# Patient Record
Sex: Female | Born: 1995 | Race: White | Hispanic: No | Marital: Single | Smoking: Former smoker
Health system: Southern US, Community
[De-identification: ages and names within clinical notes are randomized; demographics above are authoritative.]

## PROBLEM LIST (undated history)

## (undated) ENCOUNTER — Inpatient Hospital Stay (HOSPITAL_COMMUNITY): Payer: Self-pay

## (undated) DIAGNOSIS — B379 Candidiasis, unspecified: Secondary | ICD-10-CM

## (undated) DIAGNOSIS — Z803 Family history of malignant neoplasm of breast: Secondary | ICD-10-CM

## (undated) DIAGNOSIS — B9689 Other specified bacterial agents as the cause of diseases classified elsewhere: Secondary | ICD-10-CM

## (undated) DIAGNOSIS — F909 Attention-deficit hyperactivity disorder, unspecified type: Secondary | ICD-10-CM

## (undated) DIAGNOSIS — N760 Acute vaginitis: Secondary | ICD-10-CM

## (undated) HISTORY — PX: NO PAST SURGERIES: SHX2092

## (undated) HISTORY — PX: CHOLECYSTECTOMY: SHX55

## (undated) HISTORY — DX: Acute vaginitis: N76.0

## (undated) HISTORY — DX: Family history of malignant neoplasm of breast: Z80.3

## (undated) HISTORY — DX: Acute vaginitis: B96.89

## (undated) HISTORY — DX: Candidiasis, unspecified: B37.9

## (undated) HISTORY — DX: Attention-deficit hyperactivity disorder, unspecified type: F90.9

---
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
## 2016-07-18 ENCOUNTER — Encounter: Payer: Self-pay | Admitting: Pediatrics

## 2016-07-19 ENCOUNTER — Encounter: Payer: Self-pay | Admitting: Pediatrics
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
## 2017-10-07 ENCOUNTER — Encounter: Payer: Self-pay | Admitting: Emergency Medicine

## 2017-10-07 ENCOUNTER — Emergency Department
Admission: EM | Admit: 2017-10-07 | Discharge: 2017-10-07 | Disposition: A | Discharge status: Home / Self Care | Payer: Self-pay | Attending: Emergency Medicine | Admitting: Emergency Medicine

## 2017-10-07 DIAGNOSIS — B9789 Other viral agents as the cause of diseases classified elsewhere: Secondary | ICD-10-CM | POA: Insufficient documentation

## 2017-10-07 DIAGNOSIS — Z79899 Other long term (current) drug therapy: Secondary | ICD-10-CM | POA: Insufficient documentation

## 2017-10-07 DIAGNOSIS — J069 Acute upper respiratory infection, unspecified: Secondary | ICD-10-CM | POA: Insufficient documentation

## 2017-10-07 MED ORDER — PSEUDOEPH-BROMPHEN-DM 30-2-10 MG/5ML PO SYRP: 5.0000 mL | ORAL_SOLUTION | Freq: Four times a day (QID) | ORAL | 0 refills | Status: DC | PRN

## 2017-10-07 MED ORDER — METHYLPREDNISOLONE 4 MG PO TBPK: ORAL_TABLET | ORAL | 0 refills | Status: DC

## 2017-10-07 NOTE — ED Notes (Signed)
AAOx3.  Skin warm and dry.  NAD 

## 2017-10-07 NOTE — ED Triage Notes (Signed)
Productive cough and sinus congestion x 2 days.  Also c/o pain with cough and general body aches.

## 2017-10-07 NOTE — ED Provider Notes (Signed)
[facility] Emergency Department Provider Note   ____________________________________________   First MD Initiated Contact with Patient 10/07/17 0848     (approximate)  I have reviewed the triage vital signs and the nursing notes.   HISTORY  Chief Complaint Cough and URI    HPI Michele Fields is a 21 y.o. female patient complaining of productive cough and sinus congestion for 2 days. Patient stated bite secondary to continue coughing. Patient denies fever associated this complaint. Patient denies nausea, vomiting, diarrhea. No palliative measures for his complaint. Patient rates the pain as a 5/10. Patient described a pain as "achy".   Past Medical History:  Diagnosis Date  . ADHD (attention deficit hyperactivity disorder)     Patient Active Problem List   Diagnosis Date Noted  . Thyroid nodule 10/19/2014  . BMI (body mass index), pediatric, > 99% for age 19/23/2014  . ADD (attention deficit disorder) 07/15/2013  . Presence of subdermal contraceptive device 07/15/2013  . Headache(784.0) 07/15/2013  . Allergic rhinitis 07/15/2013  . Menorrhagia 06/19/2013  . Screening examination for venereal disease 06/19/2013  . Abdominal pain, other specified site 06/19/2013    History reviewed. No pertinent surgical history.  Prior to Admission medications   Medication Sig Start Date End Date Taking? Authorizing Provider  brompheniramine-pseudoephedrine-DM 30-2-10 MG/5ML syrup Take 5 mLs by mouth 4 (four) times daily as needed. 10/07/17   Joni Reining, PA-C  etonogestrel (NEXPLANON) 68 MG IMPL implant Inject 1 each (68 mg total) into the skin once. 07/15/13   Owens Shark, MD  ibuprofen (ADVIL,MOTRIN) 800 MG tablet Take 800 mg by mouth every 8 (eight) hours as needed for headache or moderate pain.    [provider]  methylPREDNISolone (MEDROL DOSEPAK) 4 MG TBPK tablet Take Tapered dose as directed 10/07/17   Joni Reining, PA-C  ondansetron  (ZOFRAN) 8 MG tablet Take 8 mg by mouth daily.    [provider]  ranitidine (ZANTAC) 150 MG tablet Take 150 mg by mouth 2 (two) times daily.    [provider]    Allergies Other  Family History  Problem Relation Age of Onset  . Thyroid disease Paternal Grandmother     Social History Social History  Substance Use Topics  . Smoking status: Never Smoker  . Smokeless tobacco: Never Used  . Alcohol use No    Review of Systems  Constitutional: No fever/chills Eyes: No visual changes. ENT: No sore throat. Cardiovascular: Denies chest pain. Respiratory: Denies shortness of breath. Gastrointestinal: No abdominal pain.  No nausea, no vomiting.  No diarrhea.  No constipation. Genitourinary: Negative for dysuria. Musculoskeletal: Negative for back pain. Skin: Negative for rash. Neurological: Negative for headaches, focal weakness or numbness. Psychiatric:ADHD  ____________________________________________   PHYSICAL EXAM:  VITAL SIGNS: ED Triage Vitals [10/07/17 0841]  Enc Vitals Group     BP 123/83     Pulse Rate 93     Resp 16     Temp 97.9 F (36.6 C)     Temp Source Oral     SpO2 100 %     Weight 200 lb (90.7 kg)     Height  (1.6 m)     Head Circumference      Peak Flow      Pain Score 5     Pain Loc      Pain Edu?      Excl. in GC?    Constitutional: Alert and oriented. Well appearing and in  no acute distress. Eyes: Conjunctivae are normal. PERRL. EOMI. Head: Atraumatic. Nose: bilateral maxillary guarding. Edematous nasal turbinates clear rhinorrhea. Mouth/Throat: Mucous membranes are moist.  Oropharynx non-erythematous.  Postnasal drainage Neck: No stridor.  Hematological/Lymphatic/Immunilogical: No cervical lymphadenopathy. Cardiovascular: Normal rate, regular rhythm. Grossly normal heart sounds.  Good peripheral circulation. Respiratory: Normal respiratory effort.  No retractions. Mild Rales. Coughing with deep  inspiration Neurologic:  Normal speech and language. No gross focal neurologic deficits are appreciated. No gait instability. Skin:  Skin is warm, dry and intact. No rash noted. Psychiatric: Mood and affect are normal. Speech and behavior are normal.  ____________________________________________   LABS (all labs ordered are listed, but only abnormal results are displayed)  Labs Reviewed - No data to display ____________________________________________  EKG   ____________________________________________  RADIOLOGY  No results found.  ____________________________________________   PROCEDURES  Procedure(s) performed: None  Procedures  Critical Care performed: No  ____________________________________________   INITIAL IMPRESSION / ASSESSMENT AND PLAN / ED COURSE  As part of my medical decision making, I reviewed the following data within the electronic MEDICAL RECORD NUMBER    Cough and nasal congestion secondary to viral respiratory infection. Patient given discharge care instruction. Patient given a work note and advised take medication as directed. Patient advised to follow-up with PCP.      ____________________________________________   FINAL CLINICAL IMPRESSION(S) / ED DIAGNOSES  Final diagnoses:  Viral URI with cough      NEW MEDICATIONS STARTED DURING THIS VISIT:  New Prescriptions   BROMPHENIRAMINE-PSEUDOEPHEDRINE-DM 30-2-10 MG/5ML SYRUP    Take 5 mLs by mouth 4 (four) times daily as needed.   METHYLPREDNISOLONE (MEDROL DOSEPAK) 4 MG TBPK TABLET    Take Tapered dose as directed     Note:  This document was prepared using Dragon voice recognition software and may include unintentional dictation errors.    Joni Reining, PA-C 10/07/17 2841    Jene Every, MD 10/07/17 [id number]

## 2017-11-09 ENCOUNTER — Emergency Department
Admission: EM | Admit: 2017-11-09 | Discharge: 2017-11-10 | Disposition: A | Discharge status: Home / Self Care | Payer: Medicaid Other | Attending: Emergency Medicine | Admitting: Emergency Medicine

## 2017-11-09 ENCOUNTER — Emergency Department: Payer: Medicaid Other

## 2017-11-09 ENCOUNTER — Encounter: Payer: Self-pay | Admitting: Emergency Medicine

## 2017-11-09 DIAGNOSIS — B9689 Other specified bacterial agents as the cause of diseases classified elsewhere: Secondary | ICD-10-CM

## 2017-11-09 DIAGNOSIS — B3731 Acute candidiasis of vulva and vagina: Secondary | ICD-10-CM

## 2017-11-09 DIAGNOSIS — N760 Acute vaginitis: Secondary | ICD-10-CM

## 2017-11-09 DIAGNOSIS — N898 Other specified noninflammatory disorders of vagina: Secondary | ICD-10-CM | POA: Insufficient documentation

## 2017-11-09 DIAGNOSIS — R102 Pelvic and perineal pain: Secondary | ICD-10-CM | POA: Insufficient documentation

## 2017-11-09 DIAGNOSIS — Z79899 Other long term (current) drug therapy: Secondary | ICD-10-CM | POA: Insufficient documentation

## 2017-11-09 DIAGNOSIS — B373 Candidiasis of vulva and vagina: Secondary | ICD-10-CM

## 2017-11-09 LAB — URINALYSIS, COMPLETE (UACMP) WITH MICROSCOPIC
BILIRUBIN URINE: NEGATIVE
Glucose, UA: NEGATIVE mg/dL
Hgb urine dipstick: NEGATIVE
Ketones, ur: 5 mg/dL — AB
Nitrite: NEGATIVE
Protein, ur: 30 mg/dL — AB
Specific Gravity, Urine: 1.031 — ABNORMAL HIGH (ref 1.005–1.030)
pH: 5 (ref 5.0–8.0)

## 2017-11-09 LAB — COMPREHENSIVE METABOLIC PANEL
ALBUMIN: 4.8 g/dL (ref 3.5–5.0)
ALT: 23 U/L (ref 14–54)
AST: 11 U/L — ABNORMAL LOW (ref 15–41)
Alkaline Phosphatase: 69 U/L (ref 38–126)
Anion gap: 11 (ref 5–15)
BUN: 10 mg/dL (ref 6–20)
CO2: 24 mmol/L (ref 22–32)
Calcium: 9.7 mg/dL (ref 8.9–10.3)
Chloride: 102 mmol/L (ref 101–111)
Creatinine, Ser: 0.84 mg/dL (ref 0.44–1.00)
GFR calc Af Amer: >60 mL/min (ref >60)
Glucose, Bld: 95 mg/dL (ref 65–99)
POTASSIUM: 4.2 mmol/L (ref 3.5–5.1)
Sodium: 137 mmol/L (ref 135–145)
Total Bilirubin: 1.1 mg/dL (ref 0.3–1.2)
Total Protein: 8.1 g/dL (ref 6.5–8.1)

## 2017-11-09 LAB — CBC
HCT: 39.6 % (ref 35.0–47.0)
Hemoglobin: 13.4 g/dL (ref 12.0–16.0)
MCH: 29.8 pg (ref 26.0–34.0)
MCHC: 33.9 g/dL (ref 32.0–36.0)
MCV: 87.8 fL (ref 80.0–100.0)
Platelets: 241 10*3/uL (ref 150–440)
RBC: 4.51 MIL/uL (ref 3.80–5.20)
RDW: 13.9 % (ref 11.5–14.5)
WBC: 14.3 10*3/uL — AB (ref 3.6–11.0)

## 2017-11-09 LAB — WET PREP, GENITAL
Sperm: NONE SEEN
TRICH WET PREP: NONE SEEN

## 2017-11-09 LAB — POCT PREGNANCY, URINE: Preg Test, Ur: NEGATIVE

## 2017-11-09 LAB — CHLAMYDIA/NGC RT PCR (ARMC ONLY)
Chlamydia Tr: NOT DETECTED
N GONORRHOEAE: NOT DETECTED

## 2017-11-09 MED ORDER — CEFTRIAXONE SODIUM 250 MG IJ SOLR
250.0000 mg | Freq: Once | INTRAMUSCULAR | Status: AC
  Administered 2017-11-09: 250 mg via INTRAMUSCULAR
  Filled 2017-11-09: qty 250

## 2017-11-09 MED ORDER — IBUPROFEN 800 MG PO TABS
800.0000 mg | ORAL_TABLET | Freq: Once | ORAL | Status: AC
  Administered 2017-11-09: 800 mg via ORAL
  Filled 2017-11-09: qty 1

## 2017-11-09 MED ORDER — LIDOCAINE HCL (PF) 1 % IJ SOLN
INTRAMUSCULAR | Status: AC
  Administered 2017-11-09: 0.9 mL
  Filled 2017-11-09: qty 5

## 2017-11-09 MED ORDER — LIDOCAINE HCL (PF) 1 % IJ SOLN
5.0000 mL | Freq: Once | INTRAMUSCULAR | Status: AC
  Administered 2017-11-09: 0.9 mL

## 2017-11-09 MED ORDER — METRONIDAZOLE 500 MG PO TABS
500.0000 mg | ORAL_TABLET | Freq: Once | ORAL | Status: AC
Start: 2017-11-10 — End: 2017-11-09
  Administered 2017-11-09: 500 mg via ORAL
  Filled 2017-11-09: qty 1

## 2017-11-09 MED ORDER — FLUCONAZOLE 50 MG PO TABS
150.0000 mg | ORAL_TABLET | Freq: Once | ORAL | Status: AC
  Administered 2017-11-09: 150 mg via ORAL
  Filled 2017-11-09: qty 1

## 2017-11-09 MED ORDER — DOXYCYCLINE HYCLATE 100 MG PO TABS: 100.0000 mg | ORAL_TABLET | Freq: Two times a day (BID) | ORAL | 0 refills | Status: DC

## 2017-11-09 NOTE — ED Provider Notes (Signed)
[facility] Emergency Department Provider Note   ____________________________________________    I have reviewed the triage vital signs and the nursing notes.   HISTORY  Chief Complaint Pelvic Pain     HPI Michele Fields is a 21 y.o. female who presents with pelvic pain and vaginal discharge.  Patient reports symptoms of been occurring over the last several days with worsening discharge and worsening cramping over the last 2 days.  She denies fevers or chills.  No nausea or vomiting.  She is concerned about STD.  She does report some discomfort with urination but she thinks it is related to pain in her vagina.  Is not taken anything for this   Past Medical History:  Diagnosis Date  . ADHD (attention deficit hyperactivity disorder)     Patient Active Problem List   Diagnosis Date Noted  . Thyroid nodule 10/19/2014  . BMI (body mass index), pediatric, > 99% for age 33/23/2014  . ADD (attention deficit disorder) 07/15/2013  . Presence of subdermal contraceptive device 07/15/2013  . Headache(784.0) 07/15/2013  . Allergic rhinitis 07/15/2013  . Menorrhagia 06/19/2013  . Screening examination for venereal disease 06/19/2013  . Abdominal pain, other specified site 06/19/2013    History reviewed. No pertinent surgical history.  Prior to Admission medications   Medication Sig Start Date End Date Taking? Authorizing Provider  brompheniramine-pseudoephedrine-DM 30-2-10 MG/5ML syrup Take 5 mLs by mouth 4 (four) times daily as needed. 10/07/17   Joni ReiningSmith, Ronald K, PA-C  etonogestrel (NEXPLANON) 68 MG IMPL implant Inject 1 each (68 mg total) into the skin once. 07/15/13   Owens SharkPerry, Martha F, MD  ibuprofen (ADVIL,MOTRIN) 800 MG tablet Take 800 mg by mouth every 8 (eight) hours as needed for headache or moderate pain.    [provider]  methylPREDNISolone (MEDROL DOSEPAK) 4 MG TBPK tablet Take Tapered dose as directed 10/07/17   Joni ReiningSmith, Ronald K, PA-C    ondansetron (ZOFRAN) 8 MG tablet Take 8 mg by mouth daily.    [provider]  ranitidine (ZANTAC) 150 MG tablet Take 150 mg by mouth 2 (two) times daily.    [provider]     Allergies Other  Family History  Problem Relation Age of Onset  . Thyroid disease Paternal Grandmother     Social History Social History   Tobacco Use  . Smoking status: Never Smoker  . Smokeless tobacco: Never Used  Substance Use Topics  . Alcohol use: No  . Drug use: No    Review of Systems  Constitutional: No fever/chills Eyes: No visual changes.  ENT: No sore throat. Cardiovascular: Denies chest pain. Respiratory: Denies shortness of breath. Gastrointestinal: No nausea, no vomiting.   Genitourinary: As above Musculoskeletal: Negative for back pain. Skin: Negative for rash. Neurological: Negative for headaches or weakness   ____________________________________________   PHYSICAL EXAM:  VITAL SIGNS: ED Triage Vitals [11/09/17 2010]  Enc Vitals Group     BP 131/81     Pulse Rate 79     Resp 16     Temp 99.3 F (37.4 C)     Temp Source Oral     SpO2 100 %     Weight 80.3 kg (177 lb)     Height 1.6 m (5\' 3" )     Head Circumference      Peak Flow      Pain Score 9     Pain Loc      Pain Edu?  Constitutional: Alert and oriented. No acute distress.  Eyes: Conjunctivae are normal.   Nose: No congestion/rhinnorhea. Mouth/Throat: Mucous membranes are moist.   Neck:  Painless ROM Cardiovascular: Normal rate, regular rhythm.   Good peripheral circulation. Respiratory: Normal respiratory effort.  No retractions.  Gastrointestinal: Mild lower abdomen tenderness to palpation .no distention.  No CVA tenderness. Genitourinary: Mild cervicitis, thick yellow discharge adherent to the cervix and in the vaginal vault Musculoskeletal: Warm and well perfused Neurologic:  Normal speech and language. No gross focal neurologic deficits are appreciated.  Skin:  Skin is  warm, dry and intact. No rash noted. Psychiatric: Mood and affect are normal. Speech and behavior are normal.  ____________________________________________   LABS (all labs ordered are listed, but only abnormal results are displayed)  Labs Reviewed  WET PREP, GENITAL - Abnormal; Notable for the following components:      Result Value   Yeast Wet Prep HPF POC PRESENT (*)    Clue Cells Wet Prep HPF POC PRESENT (*)    WBC, Wet Prep HPF POC MANY (*)    All other components within normal limits  COMPREHENSIVE METABOLIC PANEL - Abnormal; Notable for the following components:   AST 11 (*)    All other components within normal limits  CBC - Abnormal; Notable for the following components:   WBC 14.3 (*)    All other components within normal limits  URINALYSIS, COMPLETE (UACMP) WITH MICROSCOPIC - Abnormal; Notable for the following components:   Color, Urine YELLOW (*)    APPearance TURBID (*)    Specific Gravity, Urine 1.031 (*)    Ketones, ur 5 (*)    Protein, ur 30 (*)    Leukocytes, UA LARGE (*)    Bacteria, UA RARE (*)    Squamous Epithelial / LPF 6-30 (*)    All other components within normal limits  CHLAMYDIA/NGC RT PCR ([facility] ONLY)  POC URINE PREG, ED  POCT PREGNANCY, URINE   ____________________________________________  EKG  None ____________________________________________  RADIOLOGY  Ultrasound pelvis ____________________________________________   PROCEDURES  Procedure(s) performed: No  Procedures   Critical Care performed:No ____________________________________________   INITIAL IMPRESSION / ASSESSMENT AND PLAN / ED COURSE  Pertinent labs & imaging results that were available during my care of the patient were reviewed by me and considered in my medical decision making (see chart for details).  Patient presents with vaginal discharge and lower abdominal cramping.  Mild elevated white blood cell count.  Differential diagnosis includes  cervicitis, STD, PID, UTI  We will treat presumptively for STD/PID given exam.  Ultrasound pending    ____________________________________________   FINAL CLINICAL IMPRESSION(S) / ED DIAGNOSES  Final diagnoses:  Pelvic pain        Note:  This document was prepared using Dragon voice recognition software and may include unintentional dictation errors.    Jene EveryKinner, Shaena Parkerson, MD 11/09/17 [phone]

## 2017-11-09 NOTE — ED Triage Notes (Signed)
Pt states she has vaginal pain, white discharge and mid pelvic pain. Pt states she has had nausea. Pt is unsure of last period date. Pt appears in no acute distress in triage.

## 2017-11-10 MED ORDER — DOXYCYCLINE HYCLATE 100 MG PO TABS: 100.0000 mg | ORAL_TABLET | Freq: Two times a day (BID) | ORAL | 0 refills | Status: DC

## 2017-11-10 MED ORDER — IBUPROFEN 800 MG PO TABS: 800.0000 mg | ORAL_TABLET | Freq: Three times a day (TID) | ORAL | 0 refills | Status: DC | PRN

## 2017-11-10 MED ORDER — METRONIDAZOLE 500 MG PO TABS: 500.0000 mg | ORAL_TABLET | Freq: Two times a day (BID) | ORAL | 0 refills | Status: DC

## 2017-11-10 NOTE — ED Provider Notes (Signed)
-----------------------------------------   12:13 AM on 11/10/2017 -----------------------------------------  Updated patient of negative ultrasound.  Will discharge home with Flagyl for bacterial vaginitis.  Patient received IM Rocephin and Diflucan here.  Strict return precautions given.  Patient verbalizes understanding and agrees with plan of care.  Pelvis US interpreted per Dr. Phill MyronMcClintock: Normal pelvic ultrasound. No acute abnormality identified.   Irean HongSung, Jade J, MD 11/10/17 [phone]

## 2017-11-10 NOTE — Discharge Instructions (Signed)
1.  Continue antibiotics and anti-inflammatory as prescribed. 2.  Return to the ER for worsening symptoms, persistent vomiting, difficulty breathing or other concerns.

## 2017-11-18 ENCOUNTER — Ambulatory Visit (INDEPENDENT_AMBULATORY_CARE_PROVIDER_SITE_OTHER): Payer: Medicaid Other | Admitting: Obstetrics and Gynecology

## 2017-11-18 ENCOUNTER — Encounter: Payer: Self-pay | Admitting: Obstetrics and Gynecology

## 2017-11-18 ENCOUNTER — Telehealth: Payer: Self-pay | Admitting: Obstetrics and Gynecology

## 2017-11-18 VITALS — BP 120/70 | HR 110 | Ht 63.0 in | Wt 180.0 lb

## 2017-11-18 DIAGNOSIS — Z309 Encounter for contraceptive management, unspecified: Secondary | ICD-10-CM | POA: Diagnosis not present

## 2017-11-18 DIAGNOSIS — Z113 Encounter for screening for infections with a predominantly sexual mode of transmission: Secondary | ICD-10-CM | POA: Diagnosis not present

## 2017-11-18 DIAGNOSIS — Z3009 Encounter for other general counseling and advice on contraception: Secondary | ICD-10-CM

## 2017-11-18 DIAGNOSIS — N739 Female pelvic inflammatory disease, unspecified: Secondary | ICD-10-CM

## 2017-11-18 DIAGNOSIS — Z3202 Encounter for pregnancy test, result negative: Secondary | ICD-10-CM

## 2017-11-18 DIAGNOSIS — N912 Amenorrhea, unspecified: Secondary | ICD-10-CM

## 2017-11-18 DIAGNOSIS — Z0000 Encounter for general adult medical examination without abnormal findings: Secondary | ICD-10-CM

## 2017-11-18 LAB — POCT URINE PREGNANCY: Preg Test, Ur: NEGATIVE

## 2017-11-18 NOTE — Telephone Encounter (Addendum)
Patient [facility]

## 2017-11-18 NOTE — Progress Notes (Signed)
Gynecology Annual Exam  PCP: Patient, No Pcp Per  Chief Complaint:  Chief Complaint  Patient presents with  . Gynecologic Exam    lower abd and back pain, recent BV and yeast infection    History of Present Illness: Patient is a 21 y.o. G0P0000 presents for annual exam. The patient complains of irregular periods. She says that since the time of starting her periods at 16 or 17 that her cycles have been irregular. She was placed on birth control and then had a Nexplanon. For the three years that she had a Nexplanon, she did not have a period. The Nexplanon was removed in Krystian of 2018. She has not had a period since the removal of the nexplanon.  LMP: No LMP recorded (approximate). Menarche:16 Average Interval: irregular, not applicable days Duration of flow: unknown days Heavy Menses: yes Clots: no Intermenstrual Bleeding: yes Postcoital Bleeding: no Dysmenorrhea: yes  The patient is sexually active. She currently uses none for contraception. She denies dyspareunia.  The patient does not perform self breast exams.  There is notable family history of breast or ovarian cancer in her family. Her Aunt had Breast cancer in her 30s.    The patient wears seatbelts: yes.  The patient has regular exercise: not asked.    The patient denies current symptoms of depression.    Review of Systems: Review of Systems  Constitutional: Negative for chills, fever, malaise/fatigue and weight loss.  HENT: Negative for congestion, hearing loss and sinus pain.   Eyes: Negative for blurred vision and double vision.  Respiratory: Negative for cough, sputum production, shortness of breath and wheezing.   Cardiovascular: Negative for chest pain, palpitations, orthopnea and leg swelling.  Gastrointestinal: Negative for abdominal pain, constipation, diarrhea, nausea and vomiting.  Genitourinary: Negative for dysuria, flank pain, frequency, hematuria and urgency.  Musculoskeletal: Negative for back  pain, falls and joint pain.  Skin: Negative for itching and rash.  Neurological: Negative for dizziness and headaches.  Psychiatric/Behavioral: Negative for depression, substance abuse and suicidal ideas. The patient is not nervous/anxious.     Past Medical History:  Past Medical History:  Diagnosis Date  . ADHD (attention deficit hyperactivity disorder)   . Bacterial vaginosis   . Yeast infection     Past Surgical History:  Past Surgical History:  Procedure Laterality Date  . NO PAST SURGERIES      Gynecologic History:  No LMP recorded (approximate). Contraception: none Last Pap: Not recommended based on age.  Obstetric History: G0P0000  Family History:  Family History  Problem Relation Age of Onset  . Thyroid disease Paternal Grandmother     Social History:  Social History   Socioeconomic History  . Marital status: Single    Spouse name: Not on file  . Number of children: Not on file  . Years of education: Not on file  . Highest education level: Not on file  Social Needs  . Financial resource strain: Not on file  . Food insecurity - worry: Not on file  . Food insecurity - inability: Not on file  . Transportation needs - medical: Not on file  . Transportation needs - non-medical: Not on file  Occupational History  . Not on file  Tobacco Use  . Smoking status: Never Smoker  . Smokeless tobacco: Never Used  Substance and Sexual Activity  . Alcohol use: No  . Drug use: No  . Sexual activity: Yes    Birth control/protection: None  Other Topics Concern  .  Not on file  Social History Narrative  . Not on file    Allergies:  Allergies  Allergen Reactions  . Other Other (See Comments)    Seasonal Allergies    Medications: Prior to Admission medications   Medication Sig Start Date End Date Taking? Authorizing Provider  doxycycline (VIBRA-TABS) 100 MG tablet Take 1 tablet (100 mg total) 2 (two) times daily by mouth. 11/10/17  Yes Irean Hong, MD    ibuprofen (ADVIL,MOTRIN) 800 MG tablet Take 800 mg by mouth every 8 (eight) hours as needed. for pain 11/10/17  Yes [provider]  metroNIDAZOLE (FLAGYL) 500 MG tablet Take 1 tablet (500 mg total) 2 (two) times daily by mouth. 11/10/17  Yes Irean Hong, MD  metroNIDAZOLE (FLAGYL) 500 MG tablet Take 1 tablet (500 mg total) by mouth 2 (two) times daily for 7 days. 11/19/17 11/26/17  [name], MD    Physical Exam Vitals: Blood pressure 120/70, pulse (!) 110, height 5\' 3"  (1.6 m), weight 180 lb (81.6 kg).  General: NAD HEENT: normocephalic, anicteric Thyroid: no enlargement, no palpable nodules Pulmonary: No increased work of breathing, CTAB Cardiovascular: RRR, distal pulses 2+ Breast: Breast symmetrical, no tenderness, no palpable nodules or masses, no skin or nipple retraction present, no nipple discharge.  No axillary or supraclavicular lymphadenopathy. Abdomen: NABS, soft, non-tender, non-distended.  Umbilicus without lesions.  No hepatomegaly, splenomegaly or masses palpable. No evidence of hernia  Genitourinary:  External: Normal external female genitalia.  Normal urethral meatus, normal  Bartholin's and Skene's glands.    Vagina: Normal vaginal mucosa, no evidence of prolapse.    Cervix: Grossly normal in appearance, no bleeding  Uterus: Non-enlarged, mobile, normal contour. +CMT  Adnexa: ovaries non-enlarged, no adnexal masses, bilateral adnexal tenderness.  Rectal: deferred  Lymphatic: no evidence of inguinal lymphadenopathy Extremities: no edema, erythema, or tenderness Neurologic: Grossly intact Psychiatric: mood appropriate, affect full  Female chaperone present for pelvic and breast  portions of the physical exam    Assessment: 21 y.o. G0P0000 routine annual exam  Plan: Problem List Items Addressed This Visit    None    Visit Diagnoses    Amenorrhea    -  Primary   Relevant Orders   POCT urine pregnancy (Completed)   Screening examination  for STD (sexually transmitted disease)       Relevant Orders   GC/Chlamydia Probe Amp   Pelvic inflammatory disease       Relevant Medications   metroNIDAZOLE (FLAGYL) 500 MG tablet      1) Pelvic Inflammatory disease. Patient was just seen and treated for BV and PID at the hospital. She has been taking her antibiotics. She has completed one week of both doxycycline and flagyl. She has a second week of doxycycline, but not of flagyl. I send a script for one additional week of flagyl to the pharmacy so the patient could complete 2 weeks of treatment with both Doxycycline and flagyl in keeping with the CDC guidelines for outpatient treatment of PID. Will continue to monitor.  2) Contraception. Patient is having unprotected intercourse. She reports her last intercourse was on November 24th 2018. Her urine pregnancy test today in office was negative. She was happy with her previous form on contraception, the Nexplanon, but she would now like to switch to an IUD. Discussed risks and benefits of the device including pelvic pain, spotting, irregular bleeding, uterine perforation, and migration of the device. Delay insertion of IUD until symptoms of PID have improved.  3) STI screening was offered and aceepted. GC/CT swab sent. Patient was recently treated presumptively for these infections  4) ASCCP guidelines and rational of cervical cancer screening discussed.  Patient will start screening at 21 years old.  5) Secondary Amenorrhea- will need further evaluation. Follow up for additional work up.  6) Follow up in 1-2 weeks for IUD placement if PID symptoms have resolved. Discussed the importance of abstinence during this time period.

## 2017-11-19 MED ORDER — METRONIDAZOLE 500 MG PO TABS: 500.0000 mg | ORAL_TABLET | Freq: Two times a day (BID) | ORAL | 0 refills | Status: DC

## 2017-11-20 ENCOUNTER — Other Ambulatory Visit: Payer: Self-pay | Admitting: Obstetrics and Gynecology

## 2017-11-20 ENCOUNTER — Telehealth: Payer: Self-pay | Admitting: Obstetrics and Gynecology

## 2017-11-20 MED ORDER — ETONOGESTREL-ETHINYL ESTRADIOL 0.12-0.015 MG/24HR VA RING: VAGINAL_RING | VAGINAL | 12 refills | Status: DC

## 2017-11-20 NOTE — Telephone Encounter (Signed)
Pt aware Dr Jerene PitchSchuman out of the office today but sending to her for rx request.

## 2017-11-20 NOTE — Telephone Encounter (Signed)
Pt is calling to let Dr. Jerene PitchSchuman that she needs an medication refill and she is currently on her mentrual cycle. [facility] she waits the Nuva ring. Please advise pt.

## 2017-11-20 NOTE — Telephone Encounter (Signed)
Discussed with patient and sent rx for nuvaring to pharmacy.

## 2017-11-21 LAB — GC/CHLAMYDIA PROBE AMP
CHLAMYDIA, DNA PROBE: NEGATIVE
NEISSERIA GONORRHOEAE BY PCR: NEGATIVE

## 2017-11-25 ENCOUNTER — Ambulatory Visit (INDEPENDENT_AMBULATORY_CARE_PROVIDER_SITE_OTHER): Payer: Medicaid Other | Admitting: Obstetrics and Gynecology

## 2017-11-25 ENCOUNTER — Encounter: Payer: Self-pay | Admitting: Obstetrics and Gynecology

## 2017-11-25 VITALS — BP 110/60 | HR 72 | Ht 63.0 in | Wt 183.0 lb

## 2017-11-25 DIAGNOSIS — Z309 Encounter for contraceptive management, unspecified: Secondary | ICD-10-CM | POA: Diagnosis not present

## 2017-11-25 DIAGNOSIS — Z3009 Encounter for other general counseling and advice on contraception: Secondary | ICD-10-CM

## 2017-11-25 DIAGNOSIS — Z3202 Encounter for pregnancy test, result negative: Secondary | ICD-10-CM | POA: Diagnosis not present

## 2017-11-25 LAB — POCT URINE PREGNANCY: Preg Test, Ur: NEGATIVE

## 2017-11-25 NOTE — Progress Notes (Signed)
  HPI:      [name] is a 21 y.o. G0P0000 who LMP was Patient's last menstrual period was 11/19/2017., presents today for a problem visit.  She was originally scheduled to have a Kyleena inserted, but changed her mind and would prefer to use the [employer]. She has brought the Nuva ring into the office today for assistance in placement of the device. She also had questions regarding female orgasm.   PMHx: She  has a past medical history of ADHD (attention deficit hyperactivity disorder), Bacterial vaginosis, and Yeast infection. Also,  has a past surgical history that includes No past surgeries., family history includes Thyroid disease in her paternal grandmother.,  reports that  has never smoked. she has never used smokeless tobacco. She reports that she does not drink alcohol or use drugs.  She has a current medication list which includes the following prescription(s): doxycycline, etonogestrel-ethinyl estradiol, ibuprofen, and metronidazole. Also, is allergic to other.  Objective: BP 110/60   Pulse 72   Ht 5\' 3"  (1.6 m)   Wt 183 lb (83 kg)   LMP 11/19/2017   BMI 32.42 kg/m    No exam performed, but patient assisted with inserting the Nuvaring. Female chaperone was present in the room for the encounter.    ASSESSMENT/PLAN:    Problem List Items Addressed This Visit    None    Visit Diagnoses    Urine pregnancy test negative    -  Primary   Relevant Orders   POCT urine pregnancy (Completed)   Encounter for other general counseling and advice on contraception         More than 15 minutes was spent counseling patient on contraception, placement of contraceptive device and teaching.   Michele Fields Idlerhristanna Schuman MD 11/25/17 10:49 PM

## 2017-12-10 ENCOUNTER — Encounter: Payer: Self-pay | Admitting: Obstetrics and Gynecology

## 2017-12-31 ENCOUNTER — Encounter (HOSPITAL_COMMUNITY): Payer: Self-pay | Admitting: Emergency Medicine

## 2017-12-31 ENCOUNTER — Emergency Department (HOSPITAL_COMMUNITY)
Admission: EM | Admit: 2017-12-31 | Discharge: 2017-12-31 | Discharge status: Against Medical Advice | Payer: Medicaid Other | Attending: Emergency Medicine | Admitting: Emergency Medicine

## 2017-12-31 DIAGNOSIS — Z5321 Procedure and treatment not carried out due to patient leaving prior to being seen by health care provider: Secondary | ICD-10-CM | POA: Insufficient documentation

## 2017-12-31 LAB — URINALYSIS, ROUTINE W REFLEX MICROSCOPIC
BILIRUBIN URINE: NEGATIVE
Glucose, UA: NEGATIVE mg/dL
HGB URINE DIPSTICK: NEGATIVE
Ketones, ur: 20 mg/dL — AB
Leukocytes, UA: NEGATIVE
NITRITE: NEGATIVE
PROTEIN: NEGATIVE mg/dL
SPECIFIC GRAVITY, URINE: 1.018 (ref 1.005–1.030)
pH: 5 (ref 5.0–8.0)

## 2017-12-31 LAB — POC URINE PREG, ED: PREG TEST UR: NEGATIVE

## 2017-12-31 NOTE — ED Triage Notes (Signed)
Patient c/o bilat breast tenderness, cough with white-yellow phlegm for about week. Reports taht she took home pregnancy test and first one had 2 faint lines and second one didn't. Reports that she has white vaginal discharge but denies odors.

## 2017-12-31 NOTE — ED Notes (Signed)
Pt called from the lobby with no response 

## 2017-12-31 NOTE — ED Notes (Signed)
Patient called x2 no answer. 

## 2018-01-01 ENCOUNTER — Emergency Department
Admission: EM | Admit: 2018-01-01 | Discharge: 2018-01-01 | Disposition: A | Discharge status: Home / Self Care | Payer: Medicaid Other | Attending: Student in an Organized Health Care Education/Training Program | Admitting: Student in an Organized Health Care Education/Training Program

## 2018-01-01 ENCOUNTER — Encounter: Payer: Self-pay | Admitting: Emergency Medicine

## 2018-01-01 ENCOUNTER — Other Ambulatory Visit: Payer: Self-pay

## 2018-01-01 DIAGNOSIS — Z3202 Encounter for pregnancy test, result negative: Secondary | ICD-10-CM | POA: Insufficient documentation

## 2018-01-01 DIAGNOSIS — J069 Acute upper respiratory infection, unspecified: Secondary | ICD-10-CM | POA: Insufficient documentation

## 2018-01-01 LAB — INFLUENZA PANEL BY PCR (TYPE A & B)
Influenza A By PCR: NEGATIVE
Influenza B By PCR: NEGATIVE

## 2018-01-01 LAB — POCT PREGNANCY, URINE: Preg Test, Ur: NEGATIVE

## 2018-01-01 MED ORDER — ONDANSETRON 4 MG PO TBDP: 4.0000 mg | ORAL_TABLET | Freq: Three times a day (TID) | ORAL | 0 refills | Status: DC | PRN

## 2018-01-01 MED ORDER — BENZONATATE 200 MG PO CAPS: 200.0000 mg | ORAL_CAPSULE | Freq: Three times a day (TID) | ORAL | 0 refills | Status: DC | PRN

## 2018-01-01 MED ORDER — AZITHROMYCIN 250 MG PO TABS: ORAL_TABLET | ORAL | 0 refills | Status: DC

## 2018-01-01 MED ORDER — ONDANSETRON 4 MG PO TBDP
4.0000 mg | ORAL_TABLET | Freq: Once | ORAL | Status: AC
  Administered 2018-01-01: 4 mg via ORAL
  Filled 2018-01-01: qty 1

## 2018-01-01 NOTE — Discharge Instructions (Signed)
Follow-up with your regular doctor or the [facility] if you continue to have breast tenderness and nausea, take the medication as prescribed for the acute upper respiratory infection these are safe in pregnancy, return to emergency department if you are worsening

## 2018-01-01 NOTE — ED Triage Notes (Signed)
Pt with flu like sx for one week.

## 2018-01-01 NOTE — ED Provider Notes (Signed)
[facility] Emergency Department Provider Note  ____________________________________________   First MD Initiated Contact with Patient 01/01/18 [phone]     (approximate)  I have reviewed the triage vital signs and the nursing notes.   HISTORY  Chief Complaint Generalized Body Aches    HPI Michele Fields is a 22 y.o. female complains of generalized body aches, states she has had a cough and a fever for about a week, states fever got worse last night, she has had a headache and congestion, question if she is pregnant because she had a urine pregnancy test yesterday that had 1 faint line on, she denies chest pain or shortness of breath, abdominal pain or vaginal bleeding, she has had some nausea and vomiting, and states her breasts are sore   Past Medical History:  Diagnosis Date  . ADHD (attention deficit hyperactivity disorder)   . Bacterial vaginosis   . Family history of breast cancer   . Yeast infection     Patient Active Problem List   Diagnosis Date Noted  . Thyroid nodule 10/19/2014  . BMI (body mass index), pediatric, > 99% for age 26/23/2014  . ADD (attention deficit disorder) 07/15/2013  . Presence of subdermal contraceptive device 07/15/2013  . Headache(784.0) 07/15/2013  . Allergic rhinitis 07/15/2013  . Menorrhagia 06/19/2013  . Screening examination for venereal disease 06/19/2013  . Abdominal pain, other specified site 06/19/2013    Past Surgical History:  Procedure Laterality Date  . NO PAST SURGERIES      Prior to Admission medications   Medication Sig Start Date End Date Taking? Authorizing Provider  azithromycin (ZITHROMAX Z-PAK) 250 MG tablet 2 pills today then 1 pill a day for 4 days 01/01/18   Sherrie MustacheFisher, Roselyn BeringSusan W, PA-C  benzonatate (TESSALON) 200 MG capsule Take 1 capsule (200 mg total) by mouth 3 (three) times daily as needed for cough. 01/01/18   Faythe GheeFisher, Scottie Metayer W, PA-C  etonogestrel-ethinyl estradiol (NUVARING) 0.12-0.015 MG/24HR  vaginal ring Insert vaginally and leave in place for 3 consecutive weeks, then remove for 1 week. 11/20/17   Schuman, Christanna R, MD  ondansetron (ZOFRAN-ODT) 4 MG disintegrating tablet Take 1 tablet (4 mg total) by mouth every 8 (eight) hours as needed for nausea or vomiting. 01/01/18   Faythe GheeFisher, Kayah Hecker W, PA-C    Allergies Other  Family History  Problem Relation Age of Onset  . Thyroid disease Paternal Grandmother   . Breast cancer Maternal Aunt 32  . Breast cancer Other [id number]    Social History Social History   Tobacco Use  . Smoking status: Never Smoker  . Smokeless tobacco: Never Used  Substance Use Topics  . Alcohol use: No  . Drug use: No    Review of Systems  Constitutional: Positive fever/chills Eyes: No visual changes. ENT: No sore throat.  Positive runny nose and congestion Respiratory: Positive cough Genitourinary: Negative for dysuria. Musculoskeletal: Negative for back pain. Skin: Negative for rash.    ____________________________________________   PHYSICAL EXAM:  VITAL SIGNS: ED Triage Vitals  Enc Vitals Group     BP 01/01/18 0758 (!) 146/98     Pulse Rate 01/01/18 0758 94     Resp 01/01/18 0758 14     Temp 01/01/18 0758 98.9 F (37.2 C)     Temp Source 01/01/18 0758 Oral     SpO2 01/01/18 0758 100 %     Weight 01/01/18 0758 175 lb (79.4 kg)     Height 01/01/18 0758 5\' 3"  (1.6 m)  Head Circumference --      Peak Flow --      Pain Score 01/01/18 0802 9     Pain Loc --      Pain Edu? --      Excl. in GC? --     Constitutional: Alert and oriented. Well appearing and in no acute distress. Eyes: Conjunctivae are normal.  Head: Atraumatic. Nose: No congestion/rhinnorhea. Mouth/Throat: Mucous membranes are moist.  Throat is normal Cardiovascular: Normal rate, regular rhythm.  Heart sounds are normal Respiratory: Normal respiratory effort.  No retractions, lungs clear to auscultation, cough is dry GU: deferred Musculoskeletal: FROM all  extremities, warm and well perfused Neurologic:  Normal speech and language.  Skin:  Skin is warm, dry and intact. No rash noted. Psychiatric: Mood and affect are normal. Speech and behavior are normal.  ____________________________________________   LABS (all labs ordered are listed, but only abnormal results are displayed)  Labs Reviewed  INFLUENZA PANEL BY PCR (TYPE A & B)  POC URINE PREG, ED  POCT PREGNANCY, URINE   ____________________________________________   ____________________________________________  RADIOLOGY    ____________________________________________   PROCEDURES  Procedure(s) performed: No      ____________________________________________   INITIAL IMPRESSION / ASSESSMENT AND PLAN / ED COURSE  Pertinent labs & imaging results that were available during my care of the patient were reviewed by me and considered in my medical decision making (see chart for details).  Patient is a 22 year old female complaining of generalized body aches, she states her breasts are sore, she has nausea and vomiting, she had a dry cough for about a week, fever for about a week, and some body aches   on physical exam patient feels warm to touch, lungs are clear to auscultation, cough is dry and hacking  Urine pregnancy test is negative, flu swab was obtained, patient was given Zofran 4 mg ODT   ----------------------------------------- 11:33 AM on 01/01/2018 -----------------------------------------  Patient's flu swab was negative, pregnancy test is negative, discussed chances of her still being pregnant due to her symptoms, she is to use a home pregnancy test in about 3 days for her first morning urine to recheck her results, if they are positive she is to follow-up at the [facility] department, she was given a prescription for a Z-Pak, Tessalon Perles, and Zofran, patient states she understands how to use medication, she will comply with all recommendations, she  was discharged in stable condition    As part of my medical decision making, I reviewed the following data within the electronic MEDICAL RECORD NUMBER ____________________________________________   FINAL CLINICAL IMPRESSION(S) / ED DIAGNOSES  Final diagnoses:  Acute upper respiratory infection      NEW MEDICATIONS STARTED DURING THIS VISIT:  This SmartLink is deprecated. Use AVSMEDLIST instead to display the medication list for a patient.   Note:  This document was prepared using Dragon voice recognition software and may include unintentional dictation errors.    Faythe Ghee, PA-C 01/01/18 1135    Willy Eddy, MD 01/01/18 1357

## 2018-01-01 NOTE — ED Notes (Signed)
See triage note  Developed subjective fever for about 1 week.. Cough headache and congestion  States no relief with OTC meds

## 2018-01-02 ENCOUNTER — Inpatient Hospital Stay (HOSPITAL_COMMUNITY)
Admission: AD | Admit: 2018-01-02 | Discharge: 2018-01-02 | Disposition: A | Discharge status: Home / Self Care | Payer: Self-pay | Source: Ambulatory Visit | Attending: Obstetrics and Gynecology | Admitting: Obstetrics and Gynecology

## 2018-01-02 ENCOUNTER — Encounter (HOSPITAL_COMMUNITY): Payer: Self-pay

## 2018-01-02 DIAGNOSIS — Z3202 Encounter for pregnancy test, result negative: Secondary | ICD-10-CM | POA: Insufficient documentation

## 2018-01-02 DIAGNOSIS — R1030 Lower abdominal pain, unspecified: Secondary | ICD-10-CM | POA: Insufficient documentation

## 2018-01-02 LAB — URINALYSIS, ROUTINE W REFLEX MICROSCOPIC
Bilirubin Urine: NEGATIVE
GLUCOSE, UA: NEGATIVE mg/dL
Hgb urine dipstick: NEGATIVE
Ketones, ur: 5 mg/dL — AB
LEUKOCYTES UA: NEGATIVE
Nitrite: NEGATIVE
PH: 5 (ref 5.0–8.0)
PROTEIN: NEGATIVE mg/dL
SPECIFIC GRAVITY, URINE: 1.023 (ref 1.005–1.030)

## 2018-01-02 LAB — HCG, SERUM, QUALITATIVE: PREG SERUM: NEGATIVE

## 2018-01-02 LAB — CBC
HCT: 43 % (ref 36.0–46.0)
Hemoglobin: 14.4 g/dL (ref 12.0–15.0)
MCH: 30.1 pg (ref 26.0–34.0)
MCHC: 33.5 g/dL (ref 30.0–36.0)
MCV: 89.8 fL (ref 78.0–100.0)
Platelets: 292 10*3/uL (ref 150–400)
RBC: 4.79 MIL/uL (ref 3.87–5.11)
RDW: 13.4 % (ref 11.5–15.5)
WBC: 11.8 10*3/uL — ABNORMAL HIGH (ref 4.0–10.5)

## 2018-01-02 LAB — WET PREP, GENITAL
CLUE CELLS WET PREP: NONE SEEN
Sperm: NONE SEEN
TRICH WET PREP: NONE SEEN
Yeast Wet Prep HPF POC: NONE SEEN

## 2018-01-02 LAB — POCT PREGNANCY, URINE: Preg Test, Ur: NEGATIVE

## 2018-01-02 NOTE — MAU Note (Signed)
Pt reports "really bad abdominal pain" and my breasts hurt, states symptoms for one weeks, had one slightly positive home preg test

## 2018-01-02 NOTE — Discharge Instructions (Signed)
Your pregnancy test is negative. See your doctor in [facility] in 2 weeks as you have scheduled. Put your Nuvaring back in on Monday and use it as you have been instructed. Seek additional medical care if you develop fever, vomiting, diarrhea and worsening lower abdominal pian. Take over the counter Tylenol, Motrin or Midol by the package directions for your lower abdominal pain  In late 2019, the [facility] will [facility]. At that time, the MAU (Maternity Admissions Unit), where you are being seen today, will no longer take care of non-pregnant patients. We strongly encourage you to find a doctor's office before that time, so that you can be seen with any GYN concerns, like vaginal discharge, urinary tract infection, etc.. in a timely manner.  In order to make an office visit more convenient, the Center for [facility] will be offering evening hours with same-day appointments, walk-in appointments and scheduled appointments available during this time.  Center for [facility] @ [facility] Hours: Monday - 8am - 7:30 pm with walk-in between 4pm- 7:30 pm Tuesday - 8 am - 5 pm (starting 03/25/18 we will be open late and accepting walk-ins from 4pm - 7:30pm) Wednesday - 8 am - 5 pm (starting 06/25/18 we will be open late and accepting walk-ins from 4pm - 7:30pm) Thursday 8 am - 5 pm (starting 09/25/18 we will be open late and accepting walk-ins from 4pm - 7:30pm) Friday 8 am - 5 pm  For an appointment please call the Center for [facility] at [phone]  For urgent needs, [facility] is also available for management of urgent GYN complaints such as vaginal discharge or urinary tract infections.

## 2018-01-02 NOTE — MAU Provider Note (Signed)
Chief Complaint:  Abdominal Pain and Possible Pregnancy  First Provider Initiated Contact with Patient 01/02/18 1436    I supervised the medical student seeing this client today and accompanied the student during the exam and I repeated the exam and interview to verify the student's findings.  Additionally I reviewed the ER notes from the past couple of days.  HPI: Michele Fields is a 22 y.o. G0P0000 who presents to maternity admissions reporting abdominal pain, breast pain, vaginal discharge, nausea/vomiting, and headaches.  She has been to the ER with these symptoms on 1/8 and 1/9. On both occasions she had negative urine pregnancies. On 1/9 she had a negative rapid influenza and was prescribed azithromycin for an acute URI.  Her last menstrual period was 12/09/17.  The last time she had intercourse was 12/03/17.  She has been inconsistent with her Nuvaring. She is very concerned that she is pregnant today.    Client was concerned about her breast pain and abdominal pain and took out the Nuvaring on 12-30-17.    She has had abdominal pain since this last Friday (1/4) with associated mild nausea and vomiting x1 yesterday.  When asked, she endorses constant 10/10 pain that does not change with eating, laying/standing, .  She noticed a white vaginal discharge "a couple of days ago".  She has had BV in the past, thinks that this discharge is similar.  Denies vaginal itching, odor, or pain.  She has not had a fever or experienced chills or diaphoresis.  Past Medical History: Past Medical History:  Diagnosis Date  . ADHD (attention deficit hyperactivity disorder)   . Bacterial vaginosis   . Family history of breast cancer   . Yeast infection     Past obstetric history: OB History  Gravida Para Term Preterm AB Living  0 0 0 0 0 0  SAB TAB Ectopic Multiple Live Births  0 0 0 0 0        Past Surgical History: Past Surgical History:  Procedure Laterality Date  . NO PAST SURGERIES      Family  History: Family History  Problem Relation Age of Onset  . Thyroid disease Paternal Grandmother   . Ovarian cancer Paternal Grandmother [id number]  . Breast cancer Maternal Aunt 32  . Breast cancer Other 62  . Cervical cancer Paternal Aunt [id number]    Social History: Social History   Tobacco Use  . Smoking status: Never Smoker  . Smokeless tobacco: Never Used  Substance Use Topics  . Alcohol use: No  . Drug use: No    Allergies:  Allergies  Allergen Reactions  . Other Other (See Comments)    Seasonal Allergies    Meds:  Medications Prior to Admission  Medication Sig Dispense Refill Last Dose  . azithromycin (ZITHROMAX Z-PAK) 250 MG tablet 2 pills today then 1 pill a day for 4 days 6 each 0 01/02/2018 at Unknown time  . benzonatate (TESSALON) 200 MG capsule Take 1 capsule (200 mg total) by mouth 3 (three) times daily as needed for cough. 30 capsule 0 01/02/2018 at Unknown time  . etonogestrel-ethinyl estradiol (NUVARING) 0.12-0.015 MG/24HR vaginal ring Insert vaginally and leave in place for 3 consecutive weeks, then remove for 1 week. 1 each 12 12/29/2017  . ondansetron (ZOFRAN-ODT) 4 MG disintegrating tablet Take 1 tablet (4 mg total) by mouth every 8 (eight) hours as needed for nausea or vomiting. (Patient not taking: Reported on 01/02/2018) 20 tablet 0 Not Taking at Unknown time  I have reviewed patient's Past Medical Hx, Surgical Hx, Family Hx, Social Hx, medications and allergies.  ROS:  See HPI for current symptoms. Other systems negative    Physical Exam   Patient Vitals for the past 24 hrs:  BP Temp Temp src Pulse Resp SpO2 Height Weight  01/02/18 1335 127/90 99.1 F (37.3 C) Oral (!) 105 15 99 % 5\' 3"  (1.6 m) 78 kg (172 lb)   Constitutional: Well-developed, well-nourished female in no acute distress, mildly uncomfortable in the bed with sister in the room.  Cardiovascular: normal rate and rhythm, normal heart sounds. Respiratory: normal effort, no distress. Lungs CTAB with  no wheezes or crackles GI: Abd soft, nondistended.  Moderate tenderness to deep palpation in lower quadrants, R>L. No rebound, No guarding.  Bowel Sounds audible  MS: Extremities nontender, no edema, normal ROM Neurologic: Alert and oriented x 4.   Grossly nonfocal. GU: Neg CVAT. Skin:  Warm and Dry Psych:  Affect appropriate.  PELVIC EXAM: Cervix pink, visually closed, without lesion, scant white creamy discharge, vaginal walls and external genitalia normal. BIMANUAL EXAM: Cervix firm, anterior, neg CMT, adnexa without tenderness, enlargement, or mass.    Labs: Results for orders placed or performed during the hospital encounter of 01/02/18 (from the past 24 hour(s))  Urinalysis, Routine w reflex microscopic     Status: Abnormal   Collection Time: 01/02/18  1:35 PM  Result Value Ref Range   Color, Urine YELLOW YELLOW   APPearance CLEAR CLEAR   Specific Gravity, Urine 1.023 1.005 - 1.030   pH 5.0 5.0 - 8.0   Glucose, UA NEGATIVE NEGATIVE mg/dL   Hgb urine dipstick NEGATIVE NEGATIVE   Bilirubin Urine NEGATIVE NEGATIVE   Ketones, ur 5 (A) NEGATIVE mg/dL   Protein, ur NEGATIVE NEGATIVE mg/dL   Nitrite NEGATIVE NEGATIVE   Leukocytes, UA NEGATIVE NEGATIVE  Pregnancy, urine POC     Status: None   Collection Time: 01/02/18  1:51 PM  Result Value Ref Range   Preg Test, Ur NEGATIVE NEGATIVE  hCG, serum, qualitative     Status: None   Collection Time: 01/02/18  2:42 PM  Result Value Ref Range   Preg, Serum NEGATIVE NEGATIVE  CBC     Status: Abnormal   Collection Time: 01/02/18  2:42 PM  Result Value Ref Range   WBC 11.8 (H) 4.0 - 10.5 K/uL   RBC 4.79 3.87 - 5.11 MIL/uL   Hemoglobin 14.4 12.0 - 15.0 g/dL   HCT 45.4 09.8 - 11.9 %   MCV 89.8 78.0 - 100.0 fL   MCH 30.1 26.0 - 34.0 pg   MCHC 33.5 30.0 - 36.0 g/dL   RDW 14.7 82.9 - 56.2 %   Platelets 292 150 - 400 K/uL  Wet prep, genital     Status: Abnormal   Collection Time: 01/02/18  3:00 PM  Result Value Ref Range   Yeast Wet  Prep HPF POC NONE SEEN NONE SEEN   Trich, Wet Prep NONE SEEN NONE SEEN   Clue Cells Wet Prep HPF POC NONE SEEN NONE SEEN   WBC, Wet Prep HPF POC MANY (A) NONE SEEN   Sperm NONE SEEN       Imaging: none  MAU Course/MDM: Labs ordered, results reviewed.  Pt stable at time of discharge.   Pt had no further questions. Lab results - some still pending.  Assessment: 1. Lower abdominal pain    Qualitative pregnancy test negative, physical exam and lab results reassuring that this is  not an acute process. Given proximity to expected menstruation and inconsistent Nuvaring use, this is likely pain related to menstruation or another benign process.   Plan: Discharge home. Recommend Tylenol and Motrin as needed.  Take over the counter medication by the package directions. She has follow-up with PCP in two weeks. Encouraged her to keep this appointment.  Encouraged to return here or to other Urgent Care/ED if she develops worsening of symptoms, increase in pain, fever, or other concerning symptoms.   Nolene Bernheim, RN, MSN, NP-BC Nurse Practitioner, [facility] for [employer], [facility] 01/02/2018 4:00 PM    Larose Hires, MS3 01/02/2018 3:45 PM

## 2018-01-03 ENCOUNTER — Ambulatory Visit (INDEPENDENT_AMBULATORY_CARE_PROVIDER_SITE_OTHER): Payer: Medicaid Other | Admitting: Obstetrics and Gynecology

## 2018-01-03 ENCOUNTER — Encounter: Payer: Self-pay | Admitting: Obstetrics and Gynecology

## 2018-01-03 VITALS — BP 122/82 | HR 72 | Ht 63.0 in | Wt 172.0 lb

## 2018-01-03 DIAGNOSIS — R102 Pelvic and perineal pain: Secondary | ICD-10-CM

## 2018-01-03 DIAGNOSIS — N946 Dysmenorrhea, unspecified: Secondary | ICD-10-CM

## 2018-01-03 LAB — RPR: RPR: NONREACTIVE

## 2018-01-03 LAB — GC/CHLAMYDIA PROBE AMP (~~LOC~~) NOT AT ARMC
Chlamydia: NEGATIVE
Neisseria Gonorrhea: NEGATIVE

## 2018-01-03 MED ORDER — TRAMADOL HCL 50 MG PO TABS: 50.0000 mg | ORAL_TABLET | Freq: Four times a day (QID) | ORAL | 0 refills | Status: DC | PRN

## 2018-01-03 NOTE — Progress Notes (Signed)
Patient ID: Michele Fields, female   DOB: [birth year], 22 y.o.   MRN: [id number]  Reason for Consult: Abdominal Pain (cramping, dysmenorrhea)   Referred by No ref. provider found  Subjective:     HPI:  Michele Fields is a 22 y.o. female she presents today for a follow-up visit from the emergency room.  She was seen there on 01/01/2017 for painful menstrual cramps. She started her menses today. Has used three pads since this morning. Says that she has passed small clots.  She tells me that last week she was concerned that she might be pregnant because she was having symptoms of breast tenderness and abdominal bloating. She removed her nuvaring at that time because of those concerns. She has not had intercourse for over a month.   Past Medical History:  Diagnosis Date  . ADHD (attention deficit hyperactivity disorder)   . Bacterial vaginosis   . Family history of breast cancer   . Yeast infection    Family History  Problem Relation Age of Onset  . Thyroid disease Paternal Grandmother   . Ovarian cancer Paternal Grandmother [id number]  . Breast cancer Maternal Aunt 32  . Breast cancer Other 62  . Cervical cancer Paternal Aunt [id number]   Past Surgical History:  Procedure Laterality Date  . NO PAST SURGERIES      Short Social History:  Social History   Tobacco Use  . Smoking status: Never Smoker  . Smokeless tobacco: Never Used  Substance Use Topics  . Alcohol use: No    Allergies  Allergen Reactions  . Other Other (See Comments)    Seasonal Allergies    Current Outpatient Medications  Medication Sig Dispense Refill  . azithromycin (ZITHROMAX Z-PAK) 250 MG tablet 2 pills today then 1 pill a day for 4 days 6 each 0  . benzonatate (TESSALON) 200 MG capsule Take 1 capsule (200 mg total) by mouth 3 (three) times daily as needed for cough. 30 capsule 0  . etonogestrel-ethinyl estradiol (NUVARING) 0.12-0.015 MG/24HR vaginal ring Insert vaginally and leave in place for 3 consecutive weeks, then  remove for 1 week. 1 each 12  . ondansetron (ZOFRAN-ODT) 4 MG disintegrating tablet Take 1 tablet (4 mg total) by mouth every 8 (eight) hours as needed for nausea or vomiting. (Patient not taking: Reported on 01/02/2018) 20 tablet 0  . traMADol (ULTRAM) 50 MG tablet Take 1 tablet (50 mg total) by mouth every 6 (six) hours as needed. 20 tablet 0   No current facility-administered medications for this visit.     Review of Systems  Constitutional: Negative for chills, fatigue, fever and unexpected weight change.  HENT: Negative for trouble swallowing.  Eyes: Negative for loss of vision.  Respiratory: Negative for cough, shortness of breath and wheezing.  Cardiovascular: Negative for chest pain, leg swelling, palpitations and syncope.  GI: Positive for abdominal pain. Negative for blood in stool, diarrhea, nausea and vomiting.  GU: Negative for difficulty urinating, dysuria, frequency and hematuria.       +cramping abdominal pain Musculoskeletal: Negative for back pain, leg pain and joint pain.  Skin: Negative for rash.  Neurological: Negative for dizziness, headaches, light-headedness, numbness and seizures.  Psychiatric: Negative for behavioral problem, confusion, depressed mood and sleep disturbance.        Objective:  Objective   Vitals:   01/03/18 1404  BP: 122/82  Pulse: 72  Weight: 172 lb (78 kg)  Height: 5\' 3"  (1.6 m)   Body mass index is 30.47  kg/m.  Physical Exam  Constitutional: She is oriented to person, place, and time. She appears well-developed and well-nourished.  HENT:  Head: Normocephalic and atraumatic.  Cardiovascular: Normal rate and regular rhythm.  Pulmonary/Chest: Effort normal.  Abdominal: Soft.  Neurological: She is alert and oriented to person, place, and time.  Skin: Skin is warm and dry.  Psychiatric: She has a normal mood and affect. Her behavior is normal. Judgment and thought content normal.  Nursing note and vitals reviewed.   Reviewed  information for recent ER visit.     Assessment/Plan:   22 yo G0P0000 Dyspareunia- no improvement with NSAIDS, will prescribe tramadol. Recommended Livia or TENS unit for pelvic pain, warm showers, rest, hydration. Discussed extended cycle usage of Nuva ring. Discussed side effects of birth control. Advised patient to not remove ring if pregnancy was a concern but to seek treatment first since the Nuva ring would not harm a pregnancy.  Follow up PRN.   [name] MD [facility] 01/03/18 2:39 PM

## 2018-01-04 LAB — HIV ANTIBODY (ROUTINE TESTING W REFLEX): HIV Screen 4th Generation wRfx: NONREACTIVE

## 2018-02-18 NOTE — Telephone Encounter (Signed)
Patient decided on Nuva Ring

## 2018-03-11 ENCOUNTER — Encounter: Payer: Self-pay | Admitting: Obstetrics and Gynecology

## 2018-03-11 ENCOUNTER — Ambulatory Visit (INDEPENDENT_AMBULATORY_CARE_PROVIDER_SITE_OTHER): Payer: Self-pay | Admitting: Obstetrics and Gynecology

## 2018-03-11 VITALS — BP 130/72 | HR 96 | Ht 63.0 in | Wt 178.0 lb

## 2018-03-11 DIAGNOSIS — B373 Candidiasis of vulva and vagina: Secondary | ICD-10-CM

## 2018-03-11 DIAGNOSIS — Z8619 Personal history of other infectious and parasitic diseases: Secondary | ICD-10-CM

## 2018-03-11 DIAGNOSIS — B3731 Acute candidiasis of vulva and vagina: Secondary | ICD-10-CM

## 2018-03-11 MED ORDER — FLUCONAZOLE 150 MG PO TABS: 150.0000 mg | ORAL_TABLET | Freq: Once | ORAL | 0 refills | Status: AC

## 2018-03-11 NOTE — Progress Notes (Signed)
HPI:      [name] is a 22 y.o. G0P0000 who LMP was Patient's last menstrual period was 12/03/2017 (approximate)., presents today for a problem visit.  She complains of:  Vaginitis: Patient complains of an abnormal vaginal discharge for 1 week. Vaginal symptoms include burning and discharge described as white, curd-like and malodorous.Vulvar symptoms include burning.STI Risk: Possible STD exposure risk is high. Discharge described as: white, thick and curd-like. Other associated symptoms: pain with intercourse.  Menstrual pattern: She had been bleeding regularly.  Contraception: NuvaRing vaginal inserts. Using extended cycle NuvaRing.   Patient reports that in February she was diagnosed with [facility] and treated.   PMHx: She  has a past medical history of ADHD (attention deficit hyperactivity disorder), Bacterial vaginosis, Family history of breast cancer, and Yeast infection. Also,  has a past surgical history that includes No past surgeries., family history includes Breast cancer (age of onset: 8532) in her maternal aunt; Breast cancer (age of onset: 3262) in her other; Cervical cancer (age of onset: 4628) in her paternal aunt; Ovarian cancer (age of onset: 6324) in her paternal grandmother; Thyroid disease in her paternal grandmother.,  reports that  has never smoked. she has never used smokeless tobacco. She reports that she does not drink alcohol or use drugs.  She has a current medication list which includes the following prescription(s): etonogestrel-ethinyl estradiol and fluconazole. Also, is allergic to other.  Review of Systems  Constitutional: Negative for chills, fever, malaise/fatigue and weight loss.  HENT: Negative for congestion, hearing loss and sinus pain.   Eyes: Negative for blurred vision and double vision.  Respiratory: Negative for cough, sputum production, shortness of breath and wheezing.   Cardiovascular: Negative for chest pain, palpitations,  orthopnea and leg swelling.  Gastrointestinal: Negative for abdominal pain, constipation, diarrhea, nausea and vomiting.  Genitourinary: Positive for dysuria. Negative for flank pain, frequency, hematuria and urgency.       Pain with intercourse White thick discharge  Musculoskeletal: Negative for back pain, falls and joint pain.  Skin: Negative for itching and rash.  Neurological: Negative for dizziness and headaches.  Psychiatric/Behavioral: Negative for depression, substance abuse and suicidal ideas. The patient is not nervous/anxious.     Objective: BP 130/72 (BP Location: Left Arm, Patient Position: Sitting, Cuff Size: Normal)   Pulse 96   Ht 5\' 3"  (1.6 m)   Wt 178 lb (80.7 kg)   LMP 12/03/2017 (Approximate)   BMI 31.53 kg/m  Physical Exam  Constitutional: She is oriented to person, place, and time. She appears well-developed.  Genitourinary: There is no lesion on the right labia. There is no lesion on the left labia. Vagina exhibits no lesion. Right adnexum does not display mass. Left adnexum does not display mass. Cervix does not exhibit motion tenderness.  Genitourinary Comments: Thick white clumped vaginal discharge  HENT:  Head: Normocephalic and atraumatic.  Eyes: EOM are normal.  Neck: Neck supple. No thyromegaly present.  Cardiovascular: Normal rate and regular rhythm.  Pulmonary/Chest: Effort normal. Right breast exhibits no inverted nipple, no mass, no nipple discharge and no skin change. Left breast exhibits no inverted nipple, no mass, no nipple discharge and no skin change.  Abdominal: Soft. Bowel sounds are normal. She exhibits no distension and no mass.  Neurological: She is alert and oriented to person, place, and time.  Skin: Skin is warm and dry.  Psychiatric: She has a normal mood and affect. Her behavior is normal. Judgment and thought content  normal.  Vitals reviewed.  Microscopic wet-mount exam shows KOH done, + hyphae, no clue  cells.   ASSESSMENT/PLAN:    Problem List Items Addressed This Visit    None    Visit Diagnoses    Yeast infection of the vagina    -  Primary   Relevant Medications   fluconazole (DIFLUCAN) 150 MG tablet     Nuswab sent today for test of cure from gonorrhea and confirmation of yeast infection Yeast seen on wet mount. Diflucan for vaginitis.   15 minutes spent with patient.  Adelene Idler MD [facility], [facility] 03/11/18 2:23 PM

## 2018-03-14 LAB — NUSWAB VAGINITIS PLUS (VG+)
CANDIDA ALBICANS, NAA: POSITIVE — AB
CANDIDA GLABRATA, NAA: NEGATIVE
Chlamydia trachomatis, NAA: NEGATIVE
Neisseria gonorrhoeae, NAA: POSITIVE — AB
Trich vag by NAA: NEGATIVE

## 2018-03-17 ENCOUNTER — Encounter: Payer: Self-pay | Admitting: Emergency Medicine

## 2018-03-17 ENCOUNTER — Other Ambulatory Visit: Payer: Self-pay

## 2018-03-17 ENCOUNTER — Emergency Department
Admission: EM | Admit: 2018-03-17 | Discharge: 2018-03-17 | Disposition: A | Discharge status: Home / Self Care | Payer: BLUE CROSS/BLUE SHIELD | Attending: Emergency Medicine | Admitting: Emergency Medicine

## 2018-03-17 DIAGNOSIS — J101 Influenza due to other identified influenza virus with other respiratory manifestations: Secondary | ICD-10-CM | POA: Diagnosis not present

## 2018-03-17 DIAGNOSIS — F909 Attention-deficit hyperactivity disorder, unspecified type: Secondary | ICD-10-CM | POA: Insufficient documentation

## 2018-03-17 DIAGNOSIS — R112 Nausea with vomiting, unspecified: Secondary | ICD-10-CM | POA: Diagnosis present

## 2018-03-17 LAB — URINALYSIS, COMPLETE (UACMP) WITH MICROSCOPIC
BILIRUBIN URINE: NEGATIVE
Bacteria, UA: NONE SEEN
Glucose, UA: NEGATIVE mg/dL
Hgb urine dipstick: NEGATIVE
KETONES UR: NEGATIVE mg/dL
LEUKOCYTES UA: NEGATIVE
Nitrite: NEGATIVE
PH: 5 (ref 5.0–8.0)
PROTEIN: NEGATIVE mg/dL
Specific Gravity, Urine: 1.018 (ref 1.005–1.030)

## 2018-03-17 LAB — COMPREHENSIVE METABOLIC PANEL
ALT: 18 U/L (ref 14–54)
ANION GAP: 11 (ref 5–15)
AST: 23 U/L (ref 15–41)
Albumin: 4.4 g/dL (ref 3.5–5.0)
Alkaline Phosphatase: 50 U/L (ref 38–126)
BUN: 12 mg/dL (ref 6–20)
CHLORIDE: 108 mmol/L (ref 101–111)
CO2: 21 mmol/L — AB (ref 22–32)
CREATININE: 0.69 mg/dL (ref 0.44–1.00)
Calcium: 9.4 mg/dL (ref 8.9–10.3)
Glucose, Bld: 105 mg/dL — ABNORMAL HIGH (ref 65–99)
POTASSIUM: 4.2 mmol/L (ref 3.5–5.1)
SODIUM: 140 mmol/L (ref 135–145)
Total Bilirubin: 0.5 mg/dL (ref 0.3–1.2)
Total Protein: 8.4 g/dL — ABNORMAL HIGH (ref 6.5–8.1)

## 2018-03-17 LAB — CBC
HEMATOCRIT: 43.6 % (ref 35.0–47.0)
HEMOGLOBIN: 14.6 g/dL (ref 12.0–16.0)
MCH: 29.5 pg (ref 26.0–34.0)
MCHC: 33.4 g/dL (ref 32.0–36.0)
MCV: 88.5 fL (ref 80.0–100.0)
PLATELETS: 236 10*3/uL (ref 150–440)
RBC: 4.93 MIL/uL (ref 3.80–5.20)
RDW: 13.3 % (ref 11.5–14.5)
WBC: 13.4 10*3/uL — AB (ref 3.6–11.0)

## 2018-03-17 LAB — POC URINE PREG, ED: Preg Test, Ur: NEGATIVE

## 2018-03-17 LAB — INFLUENZA PANEL BY PCR (TYPE A & B)
Influenza A By PCR: POSITIVE — AB
Influenza B By PCR: NEGATIVE

## 2018-03-17 LAB — LIPASE, BLOOD: LIPASE: 35 U/L (ref 11–51)

## 2018-03-17 MED ORDER — OSELTAMIVIR PHOSPHATE 75 MG PO CAPS: 75.0000 mg | ORAL_CAPSULE | Freq: Two times a day (BID) | ORAL | 0 refills | Status: AC

## 2018-03-17 MED ORDER — KETOROLAC TROMETHAMINE 30 MG/ML IJ SOLN
30.0000 mg | Freq: Once | INTRAMUSCULAR | Status: AC
  Administered 2018-03-17: 30 mg via INTRAVENOUS
  Filled 2018-03-17: qty 1

## 2018-03-17 MED ORDER — ONDANSETRON 4 MG PO TBDP: 4.0000 mg | ORAL_TABLET | Freq: Three times a day (TID) | ORAL | 0 refills | Status: DC | PRN

## 2018-03-17 MED ORDER — ONDANSETRON HCL 4 MG/2ML IJ SOLN
4.0000 mg | Freq: Once | INTRAMUSCULAR | Status: AC
  Administered 2018-03-17: 4 mg via INTRAVENOUS
  Filled 2018-03-17: qty 2

## 2018-03-17 MED ORDER — SODIUM CHLORIDE 0.9 % IV BOLUS
1000.0000 mL | Freq: Once | INTRAVENOUS | Status: AC
  Administered 2018-03-17: 1000 mL via INTRAVENOUS

## 2018-03-17 MED ORDER — ACETAMINOPHEN 500 MG PO TABS
1000.0000 mg | ORAL_TABLET | Freq: Once | ORAL | Status: AC
Start: 2018-03-17 — End: 2018-03-17
  Administered 2018-03-17: 1000 mg via ORAL
  Filled 2018-03-17: qty 2

## 2018-03-17 MED ORDER — LOPERAMIDE HCL 2 MG PO TABS: 2.0000 mg | ORAL_TABLET | Freq: Four times a day (QID) | ORAL | 0 refills | Status: DC | PRN

## 2018-03-17 NOTE — ED Triage Notes (Signed)
Pt to ed with c/o emesis and diarrhea x 1 that started this am.  Pt states she was heavily intoxicated last night.  Pt states she is unsure of actual alcohol intake.

## 2018-03-17 NOTE — ED Provider Notes (Signed)
[facility] Emergency Department Provider Note  ____________________________________________  Time seen: Approximately 12:04 PM  I have reviewed the triage vital signs and the nursing notes.   HISTORY  Chief Complaint Emesis    HPI Michele Fields is a 22 y.o. female, otherwise healthy, presenting with nausea vomiting and diarrhea, chills, myalgias.  The patient states that she woke up this morning and was having simultaneous vomiting with nonbloody loose stool.  The loose stool has slowed down.  She is unable to keep anything down, including ginger ale.  She describes a diffuse abdominal discomfort without any focality.  She has lightheadedness with standing.  No dysuria, hematuria, change in vaginal discharge.  LMP 12/18 "I do not know if I could be pregnant." No cough or cold sx's.  The patient did not receive her influenza vaccination this year.  Past Medical History:  Diagnosis Date  . ADHD (attention deficit hyperactivity disorder)   . Bacterial vaginosis   . Family history of breast cancer   . Yeast infection     Patient Active Problem List   Diagnosis Date Noted  . Thyroid nodule 10/19/2014  . BMI (body mass index), pediatric, > 99% for age 28/23/2014  . ADD (attention deficit disorder) 07/15/2013  . Presence of subdermal contraceptive device 07/15/2013  . Headache(784.0) 07/15/2013  . Allergic rhinitis 07/15/2013  . Menorrhagia 06/19/2013  . Screening examination for venereal disease 06/19/2013  . Abdominal pain, other specified site 06/19/2013    Past Surgical History:  Procedure Laterality Date  . NO PAST SURGERIES      Current Outpatient Rx  . Order #: [id number] Class: Normal  . Order #: [id number] Class: Print  . Order #: [id number] Class: Print  . Order #: [id number] Class: Print    Allergies Other  Family History  Problem Relation Age of Onset  . Thyroid disease Paternal Grandmother   . Ovarian cancer Paternal Grandmother 36  .  Breast cancer Maternal Aunt 32  . Breast cancer Other 62  . Cervical cancer Paternal Aunt 68    Social History Social History   Tobacco Use  . Smoking status: Never Smoker  . Smokeless tobacco: Never Used  Substance Use Topics  . Alcohol use: Yes  . Drug use: No    Review of Systems Constitutional: Positive chills.  Positive myalgias.  No fevers.  Positive lightheadedness without syncope. Eyes: No visual changes. ENT: No sore throat. No congestion or rhinorrhea. Cardiovascular: Denies chest pain. Denies palpitations. Respiratory: Denies shortness of breath.  No cough. Gastrointestinal: Has a diffuse nonfocal abdominal pain.  +nausea, +vomiting.  +diarrhea.  No constipation. Genitourinary: Negative for dysuria.  LMP 12/18.  Musculoskeletal: Negative for back pain. Skin: Negative for rash. Neurological: Negative for headaches. No focal numbness, tingling or weakness.     ____________________________________________   PHYSICAL EXAM:  VITAL SIGNS: ED Triage Vitals [03/17/18 1150]  Enc Vitals Group     BP 137/81     Pulse Rate 93     Resp 18     Temp (!) 97.2 F (36.2 C)     Temp Source Oral     SpO2 99 %     Weight 167 lb (75.8 kg)     Height 5\' 3"  (1.6 m)     Head Circumference      Peak Flow      Pain Score 10     Pain Loc      Pain Edu?      Excl. in GC?  Constitutional: Alert and oriented.  Answers questions appropriately.  Uncomfortable appearing but nontoxic. Eyes: Conjunctivae are normal.  EOMI. No scleral icterus.  No eye discharge. Head: Atraumatic. Nose: No congestion/rhinnorhea. Mouth/Throat: Mucous membranes are moist.  Neck: No stridor.  Supple.  No JVD.  No meningismus. Cardiovascular: Normal rate, regular rhythm. No murmurs, rubs or gallops.  Respiratory: Normal respiratory effort.  No accessory muscle use or retractions. Lungs CTAB.  No wheezes, rales or ronchi. Gastrointestinal: Soft, and nondistended.  Mild diffuse tenderness to palpation  without focality.  Negative Murphy sign no guarding or rebound.  No peritoneal signs. Musculoskeletal: No LE edema.  Neurologic:  A&Ox3.  Speech is clear.  Face and smile are symmetric.  EOMI.  Moves all extremities well. Skin:  Skin is warm, dry and intact. No rash noted. Psychiatric: Mood and affect are normal. Speech and behavior are normal.  Normal judgement.  ____________________________________________   LABS (all labs ordered are listed, but only abnormal results are displayed)  Labs Reviewed  COMPREHENSIVE METABOLIC PANEL - Abnormal; Notable for the following components:      Result Value   CO2 21 (*)    Glucose, Bld 105 (*)    Total Protein 8.4 (*)    All other components within normal limits  CBC - Abnormal; Notable for the following components:   WBC 13.4 (*)    All other components within normal limits  URINALYSIS, COMPLETE (UACMP) WITH MICROSCOPIC - Abnormal; Notable for the following components:   Color, Urine YELLOW (*)    APPearance CLEAR (*)    Squamous Epithelial / LPF 0-5 (*)    All other components within normal limits  INFLUENZA PANEL BY PCR (TYPE A & B) - Abnormal; Notable for the following components:   Influenza A By PCR POSITIVE (*)    All other components within normal limits  LIPASE, BLOOD  POC URINE PREG, ED   ____________________________________________  EKG  Not indicated ____________________________________________  RADIOLOGY  No results found.  ____________________________________________   PROCEDURES  Procedure(s) performed: None  Procedures  Critical Care performed: No ____________________________________________   INITIAL IMPRESSION / ASSESSMENT AND PLAN / ED COURSE  Pertinent labs & imaging results that were available during my care of the patient were reviewed by me and considered in my medical decision making (see chart for details).  22 y.o. female, otherwise healthy, presenting with acute onset of nausea vomiting  diarrhea, myalgias, diffuse nonfocal abdominal discomfort.  Overall, the patient is well-appearing and afebrile.  She does not have any focality on her abdominal examination and acute intra-abdominal surgical pathology R's life-threatening infection is very unlikely.  The most likely etiology for the patient's symptoms is an influenza-like illness.  We will check her for flu, perform basic laboratory studies including urinalysis and pregnancy test, and initiate aggressive symptomatic treatment with intravenous fluids, pain control and antiemetic.  Plan reevaluation for final disposition.  ----------------------------------------- 1:14 PM on 03/17/2018 -----------------------------------------  The patient is feeling better at this time, and is able to keep down liquids without vomiting.  She has been ambulatory to the bathroom without any distress.  Her influenza testing is positive, and I will plan to treat her with Tamiflu given that her symptoms have an onset of less than 24 hours.  Her pregnancy test is negative, lipase is within normal limits and electrolytes are also reassuring.  Her hepatic function panel is normal.  Her white blood cells 13.4, which is consistent with her infectious state.  She does not have UTI.  At this time, the patient is stable for discharge home.  I have discussed follow-up instructions as well as return precautions with her.  ____________________________________________  FINAL CLINICAL IMPRESSION(S) / ED DIAGNOSES  Final diagnoses:  Influenza A         NEW MEDICATIONS STARTED DURING THIS VISIT:  New Prescriptions   LOPERAMIDE (IMODIUM A-D) 2 MG TABLET    Take 1 tablet (2 mg total) by mouth 4 (four) times daily as needed for diarrhea or loose stools.   ONDANSETRON (ZOFRAN ODT) 4 MG DISINTEGRATING TABLET    Take 1 tablet (4 mg total) by mouth every 8 (eight) hours as needed for nausea or vomiting.   OSELTAMIVIR (TAMIFLU) 75 MG CAPSULE    Take 1 capsule (75 mg  total) by mouth 2 (two) times daily for 5 days.      Rockne Menghini, MD 03/17/18 1315

## 2018-03-17 NOTE — ED Notes (Signed)
ED Provider at bedside. 

## 2018-03-17 NOTE — Discharge Instructions (Addendum)
Please take a clear liquid diet for the next 24-48 hours, then advance to a bland diet as tolerated.  You may take Zofran for nausea and vomiting, and loperamide is for diarrhea.  Make sure that you are washing your hands frequently and thoroughly to prevent the spread of infection.  Return to the emergency department if you develop lightheadedness, fainting, severe pain, fever, or any other symptoms concerning to you.

## 2018-03-18 ENCOUNTER — Other Ambulatory Visit: Payer: Self-pay | Admitting: Obstetrics and Gynecology

## 2018-03-18 ENCOUNTER — Ambulatory Visit: Payer: Medicaid Other

## 2018-03-18 ENCOUNTER — Encounter: Payer: Self-pay | Admitting: Obstetrics and Gynecology

## 2018-03-18 DIAGNOSIS — A549 Gonococcal infection, unspecified: Secondary | ICD-10-CM

## 2018-03-18 MED ORDER — AZITHROMYCIN 500 MG PO TABS: 1000.0000 mg | ORAL_TABLET | Freq: Once | ORAL | 0 refills | Status: AC

## 2018-03-18 NOTE — Progress Notes (Signed)
Needs azithromycin and rocephin injection. Patient aware, discussed on phone and sent mychart note. Prescription sent to pharmacy.

## 2018-03-19 ENCOUNTER — Ambulatory Visit (INDEPENDENT_AMBULATORY_CARE_PROVIDER_SITE_OTHER): Payer: Self-pay

## 2018-03-19 ENCOUNTER — Telehealth: Payer: Self-pay

## 2018-03-19 DIAGNOSIS — A549 Gonococcal infection, unspecified: Secondary | ICD-10-CM

## 2018-03-19 MED ORDER — CEFTRIAXONE SODIUM 250 MG IJ SOLR: 250.0000 mg | Freq: Once | INTRAMUSCULAR | 0 refills | Status: DC

## 2018-03-19 MED ORDER — CEFTRIAXONE SODIUM 250 MG IJ SOLR
250.0000 mg | Freq: Once | INTRAMUSCULAR | Status: AC
  Administered 2018-03-19: 250 mg via INTRAMUSCULAR

## 2018-03-19 NOTE — Telephone Encounter (Signed)
Michele Fields contacted pt via phone to notify of apt needing to be r/s d/t pt diagnosed w/flu 03/17/18. I discussed w/[facility]PH pt diagnosis & verified ok for patient to wait til 03/19/18 for Rocephin injection. Pt needs to have Tamiflu in system for at least 24 hours prior to visit and pt should still wear a mask.

## 2018-03-19 NOTE — Progress Notes (Signed)
cetr

## 2018-03-19 NOTE — Addendum Note (Signed)
Addended by: Reather LittlerUTHUS, Zayden Maffei D on: 03/19/2018 02:36 PM   Modules accepted: Orders

## 2018-04-10 ENCOUNTER — Emergency Department
Admission: EM | Admit: 2018-04-10 | Discharge: 2018-04-10 | Disposition: A | Discharge status: Home / Self Care | Payer: Self-pay | Attending: Emergency Medicine | Admitting: Emergency Medicine

## 2018-04-10 ENCOUNTER — Encounter: Payer: Self-pay | Admitting: Emergency Medicine

## 2018-04-10 DIAGNOSIS — R1111 Vomiting without nausea: Secondary | ICD-10-CM | POA: Insufficient documentation

## 2018-04-10 DIAGNOSIS — R51 Headache: Secondary | ICD-10-CM | POA: Insufficient documentation

## 2018-04-10 DIAGNOSIS — R519 Headache, unspecified: Secondary | ICD-10-CM

## 2018-04-10 DIAGNOSIS — R42 Dizziness and giddiness: Secondary | ICD-10-CM | POA: Insufficient documentation

## 2018-04-10 LAB — BASIC METABOLIC PANEL
ANION GAP: 8 (ref 5–15)
BUN: 11 mg/dL (ref 6–20)
CHLORIDE: 109 mmol/L (ref 101–111)
CO2: 21 mmol/L — AB (ref 22–32)
Calcium: 9.2 mg/dL (ref 8.9–10.3)
Creatinine, Ser: 0.7 mg/dL (ref 0.44–1.00)
GFR calc Af Amer: >60 mL/min (ref >60)
GFR calc non Af Amer: >60 mL/min (ref >60)
GLUCOSE: 94 mg/dL (ref 65–99)
Potassium: 3.9 mmol/L (ref 3.5–5.1)
Sodium: 138 mmol/L (ref 135–145)

## 2018-04-10 LAB — URINALYSIS, COMPLETE (UACMP) WITH MICROSCOPIC
BACTERIA UA: NONE SEEN
Bilirubin Urine: NEGATIVE
Glucose, UA: NEGATIVE mg/dL
Hgb urine dipstick: NEGATIVE
KETONES UR: NEGATIVE mg/dL
Nitrite: NEGATIVE
PROTEIN: NEGATIVE mg/dL
Specific Gravity, Urine: 1.018 (ref 1.005–1.030)
pH: 5 (ref 5.0–8.0)

## 2018-04-10 LAB — CBC
HCT: 39.7 % (ref 35.0–47.0)
HEMOGLOBIN: 13.5 g/dL (ref 12.0–16.0)
MCH: 30.3 pg (ref 26.0–34.0)
MCHC: 33.8 g/dL (ref 32.0–36.0)
MCV: 89.6 fL (ref 80.0–100.0)
Platelets: 272 10*3/uL (ref 150–440)
RBC: 4.43 MIL/uL (ref 3.80–5.20)
RDW: 13.6 % (ref 11.5–14.5)
WBC: 13.3 10*3/uL — ABNORMAL HIGH (ref 3.6–11.0)

## 2018-04-10 LAB — HCG, QUANTITATIVE, PREGNANCY

## 2018-04-10 MED ORDER — SODIUM CHLORIDE 0.9 % IV BOLUS
1000.0000 mL | Freq: Once | INTRAVENOUS | Status: AC
  Administered 2018-04-10: 1000 mL via INTRAVENOUS

## 2018-04-10 MED ORDER — PROCHLORPERAZINE EDISYLATE 10 MG/2ML IJ SOLN
10.0000 mg | Freq: Once | INTRAMUSCULAR | Status: AC
  Administered 2018-04-10: 10 mg via INTRAVENOUS
  Filled 2018-04-10: qty 2

## 2018-04-10 MED ORDER — DIPHENHYDRAMINE HCL 50 MG/ML IJ SOLN
25.0000 mg | Freq: Once | INTRAMUSCULAR | Status: AC
  Administered 2018-04-10: 25 mg via INTRAVENOUS
  Filled 2018-04-10: qty 1

## 2018-04-10 MED ORDER — KETOROLAC TROMETHAMINE 30 MG/ML IJ SOLN
15.0000 mg | Freq: Once | INTRAMUSCULAR | Status: AC
  Administered 2018-04-10: 15 mg via INTRAVENOUS
  Filled 2018-04-10: qty 1

## 2018-04-10 NOTE — ED Triage Notes (Signed)
Pt comes into the ED via POV c/o dizziness and nausea.  Patient states that the dizziness started this morning and the room is spinning.  Patient in NAD at this time and is neurologically intact.  Denies injury to her head and states she has been drinking plenty of fluids at home.

## 2018-04-10 NOTE — ED Provider Notes (Addendum)
[facility] Emergency Department Provider Note  ____________________________________________   I have reviewed the triage vital signs and the nursing notes. Where available I have reviewed prior notes and, if possible and indicated, outside hospital notes.    HISTORY  Chief Complaint Dizziness and Emesis    HPI Michele Fields is a 22 y.o. female with a history she states of migraines "for years", states that she began to have a migraine today at work.  She states stressor is that her migraines and she just started a new job.  She states it was gradual onset, very similar to multiple prior headaches.  She also feels lightheaded.  We will try to tease out if she is having lightheadedness or room spinning, it is difficult to know.  Sometimes she says "it is like spinning" and then she says "it is just lightheadedness" then she says "if she has dizziness" did have some vomiting with this, she states that today mild headache not the worst headache of life, began gradually.  No fever chills, positive photophobia, also loud noises bother her.  She denies any fever or stiff neck,     Past Medical History:  Diagnosis Date  . ADHD (attention deficit hyperactivity disorder)   . Bacterial vaginosis   . Family history of breast cancer   . Yeast infection     Patient Active Problem List   Diagnosis Date Noted  . Thyroid nodule 10/19/2014  . BMI (body mass index), pediatric, > 99% for age 05/15/2013  . ADD (attention deficit disorder) 07/15/2013  . Presence of subdermal contraceptive device 07/15/2013  . Headache(784.0) 07/15/2013  . Allergic rhinitis 07/15/2013  . Menorrhagia 06/19/2013  . Screening examination for venereal disease 06/19/2013  . Abdominal pain, other specified site 06/19/2013    Past Surgical History:  Procedure Laterality Date  . NO PAST SURGERIES      Prior to Admission medications   Medication Sig Start Date End Date Taking? Authorizing  Provider  etonogestrel-ethinyl estradiol (NUVARING) 0.12-0.015 MG/24HR vaginal ring Insert vaginally and leave in place for 3 consecutive weeks, then remove for 1 week. 11/20/17   Schuman, Jaquelyn Bitterhristanna R, MD  loperamide (IMODIUM A-D) 2 MG tablet Take 1 tablet (2 mg total) by mouth 4 (four) times daily as needed for diarrhea or loose stools. 03/17/18   Rockne MenghiniNorman, Anne-Caroline, MD  ondansetron (ZOFRAN ODT) 4 MG disintegrating tablet Take 1 tablet (4 mg total) by mouth every 8 (eight) hours as needed for nausea or vomiting. 03/17/18   Rockne MenghiniNorman, Anne-Caroline, MD    Allergies Other  Family History  Problem Relation Age of Onset  . Thyroid disease Paternal Grandmother   . Ovarian cancer Paternal Grandmother 2024  . Breast cancer Maternal Aunt 32  . Breast cancer Other 62  . Cervical cancer Paternal Aunt 4528    Social History Social History   Tobacco Use  . Smoking status: Never Smoker  . Smokeless tobacco: Never Used  Substance Use Topics  . Alcohol use: Yes  . Drug use: No    Review of Systems Constitutional: No fever/chills Eyes: No visual changes. ENT: No sore throat. No stiff neck no neck pain Cardiovascular: Denies chest pain. Respiratory: Denies shortness of breath. Gastrointestinal:   + vomiting.  No diarrhea.  No constipation. Genitourinary: Negative for dysuria. Musculoskeletal: Negative lower extremity swelling Skin: Negative for rash. Neurological: Negative for severe headaches, focal weakness or numbness.   ____________________________________________   PHYSICAL EXAM:  VITAL SIGNS: ED Triage Vitals  Enc Vitals Group  BP 04/10/18 1744 123/84     Pulse Rate 04/10/18 1744 (!) 110     Resp 04/10/18 1744 19     Temp 04/10/18 1744 98.9 F (37.2 C)     Temp Source 04/10/18 1744 Oral     SpO2 04/10/18 1744 99 %     Weight 04/10/18 1745 167 lb (75.8 kg)     Height 04/10/18 1745 5\' 3"  (1.6 m)     Head Circumference --      Peak Flow --      Pain Score 04/10/18 1744  10     Pain Loc --      Pain Edu? --      Excl. in GC? --     Constitutional: Alert and oriented. Well appearing and in no acute distress.  Using her cell phone, and watching the [place] on TV Eyes: Conjunctivae are normal Head: Atraumatic HEENT: No congestion/rhinnorhea. Mucous membranes are moist.  Oropharynx non-erythematous Neck:   Nontender with no meningismus, no masses, no stridor Cardiovascular: Normal rate, regular rhythm. Grossly normal heart sounds.  Good peripheral circulation. Respiratory: Normal respiratory effort.  No retractions. Lungs CTAB. Abdominal: Soft and nontender. No distention. No guarding no rebound Back:  There is no focal tenderness or step off.  there is no midline tenderness there are no lesions noted. there is no CVA tenderness  Musculoskeletal: No lower extremity tenderness, no upper extremity tenderness. No joint effusions, no DVT signs strong distal pulses no edema Neurologic: Cranial nerves II through XII are grossly intact 5 out of 5 strength bilateral upper and lower extremity. Finger to nose within normal limits heel to shin within normal limits, speech is normal with no word finding difficulty or dysarthria, reflexes symmetric, pupils are equally round and reactive to light, there is no pronator drift, sensation is normal, vision is intact to confrontation, gait is deferred, there is no nystagmus, normal neurologic exam Skin:  Skin is warm, dry and intact. No rash noted. Psychiatric: Mood and affect are normal. Speech and behavior are normal.  ____________________________________________   LABS (all labs ordered are listed, but only abnormal results are displayed)  Labs Reviewed  CBC - Abnormal; Notable for the following components:      Result Value   WBC 13.3 (*)    All other components within normal limits  BASIC METABOLIC PANEL  URINALYSIS, COMPLETE (UACMP) WITH MICROSCOPIC  HCG, QUANTITATIVE, PREGNANCY  POC URINE PREG, ED     Pertinent labs  results that were available during my care of the patient were reviewed by me and considered in my medical decision making (see chart for details). ____________________________________________  EKG  I personally interpreted any EKGs ordered by me or triage Normal sinus rhythm at 90 bpm no acute ST elevation or depression, nonspecific ST changes unremarkable EKG ____________________________________________  RADIOLOGY  Pertinent labs & imaging results that were available during my care of the patient were reviewed by me and considered in my medical decision making (see chart for details). If possible, patient and/or family made aware of any abnormal findings.  No results found. ____________________________________________    PROCEDURES  Procedure(s) performed: None  Procedures  Critical Care performed: None  ____________________________________________   INITIAL IMPRESSION / ASSESSMENT AND PLAN / ED COURSE  Pertinent labs & imaging results that were available during my care of the patient were reviewed by me and considered in my medical decision making (see chart for details).   Patient neurologically intact with a long history of  migraines negative CT scan in 2015 presents today with what she describes as a tension related headache and nausea.  She denies fever or chills she has no stiff neck there is nothing that makes me think that the patient likely has meningitis.  She is somewhat anxious but otherwise not unwell.  As noted, she is watching TV and using her cell phone when I enter the room.  Very low suspicion for head bleed, some confusion exists as to whether or not the patient has vertigo or lightheadedness but I do not see any obvious evidence of nystagmus.  TMs are normal.  We will treat her with antimigraine his medications to start with her NIH stroke scale is 0.  I am not certain that there is any utility in a CT scan of her head given that at her  young age she is already had CT scans of her head and this is a gradual onset mild, relatively, headache no focal neurologic signs.  She is not sure if she is pregnant, she thinks her last menstrual period was sometime in Cecely or March, we will send a beta hCG in case she cannot provide a urine sample. WBC is mildly elevated but c/w multiple prior checks.  ----------------------------------------- 7:15 PM on 04/10/2018 -----------------------------------------  And resting comfortably in the bed, states her headache is gone or so close to gone that she cannot tell, she no longer feels dizzy, if it is a fluids and make sure she can ambulate.  Reassuring repeat neurologic exam noted  ----------------------------------------- 7:44 PM on 04/10/2018 -----------------------------------------  Remains with a normal neurologic exam and at this time has no symptoms or complaints is requesting discharge.  We will discharge with close outpatient follow-up.  Return precautions given and understood.  Patient I discussed further care including CT etc. other than she obviously declines as she has no symptoms feels 100% better and wants to go home.  She does understand that if she feels worse she is to return to the emergency room.  Pt remains at neurologic baseline. At this time, there is nothing to suggest or support the diagnosis of subarachnoid hemorrhage, aneurysmal event, meningitis, tumor or mass, cavernous thrombosis, encephalitis, ischemic stroke, pseudotumor cerebri, glaucoma, temporal arteritis, or any other acute intracrania/neurological process. Extensive return precautions including but not limited to any new or worrisome symptoms such as worsening of, or change in, headache, any neurological symptoms, fever etc.. Natural disease course discussed with patient. The need for follow-up and all of my customary return precautions have been discussed as well.    ____________________________________________   FINAL CLINICAL IMPRESSION(S) / ED DIAGNOSES  Final diagnoses:  None      This chart was dictated using voice recognition software.  Despite best efforts to proofread,  errors can occur which can change meaning.     Jeanmarie Plant, MD 04/10/18 1811  Jeanmarie Plant, MD 04/10/18 1820  Jeanmarie Plant, MD 04/10/18 Prentice Docker  Jeanmarie Plant, MD 04/10/18 1945

## 2018-04-10 NOTE — ED Notes (Signed)
Urine POC negative. 

## 2018-04-12 LAB — URINE CULTURE

## 2018-04-24 ENCOUNTER — Emergency Department (HOSPITAL_COMMUNITY)
Admission: EM | Admit: 2018-04-24 | Discharge: 2018-04-24 | Disposition: A | Discharge status: Home / Self Care | Payer: Medicaid Other | Attending: Emergency Medicine | Admitting: Emergency Medicine

## 2018-04-24 ENCOUNTER — Encounter (HOSPITAL_COMMUNITY): Payer: Self-pay

## 2018-04-24 ENCOUNTER — Emergency Department (HOSPITAL_COMMUNITY): Payer: Medicaid Other

## 2018-04-24 ENCOUNTER — Ambulatory Visit (HOSPITAL_COMMUNITY)
Admission: EM | Admit: 2018-04-24 | Discharge: 2018-04-24 | Disposition: A | Discharge status: Nursing Home (Includes ICF) | Payer: Medicaid Other | Attending: Family Medicine | Admitting: Family Medicine

## 2018-04-24 ENCOUNTER — Other Ambulatory Visit: Payer: Self-pay

## 2018-04-24 DIAGNOSIS — S0990XA Unspecified injury of head, initial encounter: Secondary | ICD-10-CM

## 2018-04-24 DIAGNOSIS — Y999 Unspecified external cause status: Secondary | ICD-10-CM | POA: Insufficient documentation

## 2018-04-24 DIAGNOSIS — Y939 Activity, unspecified: Secondary | ICD-10-CM | POA: Insufficient documentation

## 2018-04-24 DIAGNOSIS — S060X0A Concussion without loss of consciousness, initial encounter: Secondary | ICD-10-CM | POA: Insufficient documentation

## 2018-04-24 DIAGNOSIS — Y929 Unspecified place or not applicable: Secondary | ICD-10-CM | POA: Insufficient documentation

## 2018-04-24 DIAGNOSIS — W2113XA Struck by golf club, initial encounter: Secondary | ICD-10-CM

## 2018-04-24 DIAGNOSIS — R42 Dizziness and giddiness: Secondary | ICD-10-CM

## 2018-04-24 DIAGNOSIS — S0031XA Abrasion of nose, initial encounter: Secondary | ICD-10-CM | POA: Insufficient documentation

## 2018-04-24 DIAGNOSIS — R112 Nausea with vomiting, unspecified: Secondary | ICD-10-CM

## 2018-04-24 MED ORDER — ONDANSETRON 4 MG PO TBDP: 4.0000 mg | ORAL_TABLET | Freq: Three times a day (TID) | ORAL | 0 refills | Status: DC | PRN

## 2018-04-24 MED ORDER — KETOROLAC TROMETHAMINE 60 MG/2ML IM SOLN
60.0000 mg | Freq: Once | INTRAMUSCULAR | Status: AC
  Administered 2018-04-24: 60 mg via INTRAMUSCULAR
  Filled 2018-04-24: qty 2

## 2018-04-24 MED ORDER — ONDANSETRON 4 MG PO TBDP
4.0000 mg | ORAL_TABLET | Freq: Once | ORAL | Status: AC
  Administered 2018-04-24: 4 mg via ORAL
  Filled 2018-04-24: qty 1
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
## 2018-04-24 NOTE — Discharge Instructions (Addendum)
If you develop a worsening headache, uncontrollable vomiting, or if you develop confusion, weakness or numbness in your arms or legs, blurry vision, or any other new/concerning symptoms then return to the ER for evaluation.  Otherwise use ibuprofen and Tylenol for your headache
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
## 2018-04-24 NOTE — Discharge Instructions (Addendum)
Worsening headache associated with dizziness, nausea, vomiting is very concerning for possible intracranial bleed.  As a minimum, you have a concussion.  The situation like this is very important to rule out bleeding and therefore we are sending you to the emergency department for CAT scan.
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
## 2018-11-07 LAB — HM HIV SCREENING LAB: HM HIV Screening: NEGATIVE

## 2019-09-11 ENCOUNTER — Other Ambulatory Visit: Payer: Self-pay

## 2019-09-11 ENCOUNTER — Encounter: Payer: Self-pay | Admitting: Physician Assistant

## 2019-09-11 ENCOUNTER — Ambulatory Visit: Payer: Self-pay | Admitting: Physician Assistant

## 2019-09-11 DIAGNOSIS — Z3202 Encounter for pregnancy test, result negative: Secondary | ICD-10-CM

## 2019-09-11 DIAGNOSIS — Z113 Encounter for screening for infections with a predominantly sexual mode of transmission: Secondary | ICD-10-CM

## 2019-09-11 LAB — WET PREP FOR TRICH, YEAST, CLUE
Clue Cell Exam: POSITIVE — AB
Trichomonas Exam: NEGATIVE

## 2019-09-11 LAB — PREGNANCY, URINE: Preg Test, Ur: NEGATIVE

## 2019-09-11 NOTE — Progress Notes (Signed)
STI clinic/screening visit  Subjective:  Michele Fields is a 23 y.o. female being seen today for an STI screening visit. The patient reports they do not have symptoms.  Patient has the following medical conditions:   Patient Active Problem List   Diagnosis Date Noted  . Thyroid nodule 10/19/2014  . BMI (body mass index), pediatric, > 99% for age 77/23/2014  . ADD (attention deficit disorder) 07/15/2013  . Presence of subdermal contraceptive device 07/15/2013  . Headache(784.0) 07/15/2013  . Allergic rhinitis 07/15/2013  . Menorrhagia 06/19/2013  . Screening examination for venereal disease 06/19/2013  . Abdominal pain, other specified site 06/19/2013     Chief Complaint  Patient presents with  . SEXUALLY TRANSMITTED DISEASE    HPI  Patient reports that she recently broke up with long term partner and has had new partner and would like a screening.  Also, concerned that she had an abnormal period about 2.5 weeks ago.  LMP that was normal was 07/25/19 and then had spotting for 1.5 days around 08/24/19.  Patient has recently moved and does not have PCP here yet.    See flowsheet for further details and programmatic requirements.    The following portions of the patient's history were reviewed and updated as appropriate: allergies, current medications, past medical history, past social history, past surgical history and problem list.  Objective:  There were no vitals filed for this visit.  Physical Exam Constitutional:      General: She is not in acute distress.    Appearance: Normal appearance.  HENT:     Head: Normocephalic and atraumatic.     Mouth/Throat:     Mouth: Mucous membranes are moist.     Pharynx: Oropharynx is clear. No oropharyngeal exudate or posterior oropharyngeal erythema.  Eyes:     Conjunctiva/sclera: Conjunctivae normal.  Neck:     Musculoskeletal: Neck supple.  Pulmonary:     Effort: Pulmonary effort is normal.  Abdominal:     Palpations: Abdomen  is soft. There is no mass.     Tenderness: There is no abdominal tenderness. There is no guarding or rebound.  Genitourinary:    General: Normal vulva.     Rectum: Normal.     Comments: External genitalia/pubic area without nits, lice, edema, erythema, lesions and inguinal adenopathy,. Vagina with normal mucosa and discharge, pH=4.5. Cervix without visible lesions. Uterus firm, mobile, nt, no masses, no CMT, no adnexal tenderness or fullness.  Lymphadenopathy:     Cervical: No cervical adenopathy.  Skin:    General: Skin is warm and dry.     Findings: No bruising, erythema, lesion or rash.  Neurological:     Mental Status: She is alert and oriented to person, place, and time.  Psychiatric:        Mood and Affect: Mood normal.        Behavior: Behavior normal.        Thought Content: Thought content normal.        Judgment: Judgment normal.       Assessment and Plan:  Michele Fields is a 23 y.o. female presenting to the [facility] Department for STI screening  1. Screening for STD (sexually transmitted disease) Patient without symptoms and normal wet [place]. Rec condoms with all sex Await test results.  Counseled that RN will call if needs to RTC for treatment once results are back.  - WET PREP FOR [place], YEAST, CLUE - Pregnancy, urine - Chlamydia/Gonorrhea  Lab -  HIV [place] [facility] - Syphilis Serology,  Lab  2. Pregnancy examination or test, negative result Counseled patient that her pregnancy test is negative, which means that she was not pregnant [redacted] weeks ago, but could be early pregnant. Counseled that sometimes stressors such as moving or a break up can lead to a period being skipped or even longer and heavier. Rec RTC or follow up with PCP if no period in the next 2-3 weeks.     No follow-ups on file.  No future appointments.  Matt Holmesarla J Hampton, PA

## 2019-10-25 DIAGNOSIS — Z9189 Other specified personal risk factors, not elsewhere classified: Secondary | ICD-10-CM

## 2019-10-25 DIAGNOSIS — Z1371 Encounter for nonprocreative screening for genetic disease carrier status: Secondary | ICD-10-CM

## 2019-10-25 HISTORY — DX: Other specified personal risk factors, not elsewhere classified: Z91.89

## 2019-10-25 HISTORY — DX: Encounter for nonprocreative screening for genetic disease carrier status: Z13.71

## 2019-10-28 ENCOUNTER — Encounter: Payer: Self-pay | Admitting: Obstetrics and Gynecology

## 2019-10-28 ENCOUNTER — Other Ambulatory Visit: Payer: Self-pay

## 2019-10-28 ENCOUNTER — Other Ambulatory Visit (HOSPITAL_COMMUNITY)
Admission: RE | Admit: 2019-10-28 | Discharge: 2019-10-28 | Disposition: A | Discharge status: Home / Self Care | Payer: Medicaid Other | Source: Ambulatory Visit | Attending: Obstetrics and Gynecology | Admitting: Obstetrics and Gynecology

## 2019-10-28 ENCOUNTER — Ambulatory Visit (INDEPENDENT_AMBULATORY_CARE_PROVIDER_SITE_OTHER): Payer: Medicaid Other | Admitting: Obstetrics and Gynecology

## 2019-10-28 VITALS — BP 120/76 | HR 96 | Ht 63.0 in | Wt 181.0 lb

## 2019-10-28 DIAGNOSIS — Z3009 Encounter for other general counseling and advice on contraception: Secondary | ICD-10-CM

## 2019-10-28 DIAGNOSIS — N926 Irregular menstruation, unspecified: Secondary | ICD-10-CM

## 2019-10-28 DIAGNOSIS — Z124 Encounter for screening for malignant neoplasm of cervix: Secondary | ICD-10-CM

## 2019-10-28 DIAGNOSIS — R102 Pelvic and perineal pain: Secondary | ICD-10-CM

## 2019-10-28 DIAGNOSIS — Z130 Encounter for screening for diseases of the blood and blood-forming organs and certain disorders involving the immune mechanism: Secondary | ICD-10-CM

## 2019-10-28 DIAGNOSIS — Z113 Encounter for screening for infections with a predominantly sexual mode of transmission: Secondary | ICD-10-CM | POA: Insufficient documentation

## 2019-10-28 DIAGNOSIS — Z0000 Encounter for general adult medical examination without abnormal findings: Secondary | ICD-10-CM

## 2019-10-28 NOTE — Progress Notes (Signed)
Gynecology Annual Exam  PCP: Patient, No Pcp Per  Chief Complaint:  Chief Complaint  Patient presents with  . Gynecologic Exam    Missed period. sharp pain near vagina on and off     History of Present Illness: Patient is a 23 y.o. G1P0010 presents for annual exam. The patient has no complaints today.   LMP: Patient's last menstrual period was 08/30/2019 (exact date).  Reports regular monthly periods from March to September. Has 2 days of light spotting in October but then no bleeding.   For about 1 month she has had right lower quadrant pain that comes and goes. It feels like a catching and it is worse when she lays down. The pain usually lasts 2-5 minutes and lasts throughout the day  The patient is sexually active. She currently uses none for contraception. She denies dyspareunia.  The patient does perform self breast exams.  There is notable family history of breast or ovarian cancer in her family.  The patient wears seatbelts: yes.  The patient has regular exercise: no.    The patient repots current symptoms of depression.    Review of Systems: ROS  Past Medical History:  Past Medical History:  Diagnosis Date  . ADHD (attention deficit hyperactivity disorder)   . Bacterial vaginosis   . Family history of breast cancer   . Yeast infection     Past Surgical History:  Past Surgical History:  Procedure Laterality Date  . CHOLECYSTECTOMY    . NO PAST SURGERIES      Gynecologic History:  Patient's last menstrual period was 08/30/2019 (exact date). Contraception: none Last Pap: Results were: never  Obstetric History: G1P0010  Family History:  Family History  Problem Relation Age of Onset  . Thyroid disease Paternal Grandmother   . Ovarian cancer Paternal Grandmother 50  . Breast cancer Maternal Aunt 32  . Breast cancer Other 62  . Cervical cancer Paternal Aunt 80    Social History:  Social History   Socioeconomic History  . Marital status: Single     Spouse name: Not on file  . Number of children: Not on file  . Years of education: Not on file  . Highest education level: Not on file  Occupational History  . Not on file  Social Needs  . Financial resource strain: Not on file  . Food insecurity    Worry: Not on file    Inability: Not on file  . Transportation needs    Medical: Not on file    Non-medical: Not on file  Tobacco Use  . Smoking status: Current Every Day Smoker  . Smokeless tobacco: Never Used  Substance and Sexual Activity  . Alcohol use: Yes  . Drug use: No  . Sexual activity: Yes    Birth control/protection: None  Lifestyle  . Physical activity    Days per week: 2 days    Minutes per session: 60 min  . Stress: Only a little  Relationships  . Social connections    Talks on phone: More than three times a week    Gets together: More than three times a week    Attends religious service: 1 to 4 times per year    Active member of club or organization: No    Attends meetings of clubs or organizations: Never    Relationship status: Never married  . Intimate partner violence    Fear of current or ex partner: No    Emotionally abused:  No    Physically abused: No    Forced sexual activity: No  Other Topics Concern  . Not on file  Social History Narrative  . Not on file    Allergies:  Allergies  Allergen Reactions  . Other Other (See Comments)    Seasonal Allergies    Medications: Prior to Admission medications   Medication Sig Start Date End Date Taking? Authorizing Provider  etonogestrel-ethinyl estradiol (NUVARING) 0.12-0.015 MG/24HR vaginal ring Insert vaginally and leave in place for 3 consecutive weeks, then remove for 1 week. Patient not taking: Reported on 09/11/2019 11/20/17   Homero Fellers, MD  ondansetron (ZOFRAN ODT) 4 MG disintegrating tablet Take 1 tablet (4 mg total) by mouth every 8 (eight) hours as needed for nausea or vomiting. Patient not taking: Reported on 09/11/2019 04/24/18    Sherwood Gambler, MD    Physical Exam Vitals: Blood pressure 120/76, pulse 96, height 5\' 3"  (1.6 m), weight 181 lb (82.1 kg), last menstrual period 08/30/2019.  General: NAD HEENT: normocephalic, anicteric Thyroid: no enlargement, no palpable nodules Pulmonary: No increased work of breathing, CTAB Cardiovascular: RRR, distal pulses 2+ Breast: Breast symmetrical, no tenderness, no palpable nodules or masses, no skin or nipple retraction present, no nipple discharge.  No axillary or supraclavicular lymphadenopathy. Abdomen: NABS, soft, non-tender, non-distended.  Umbilicus without lesions.  No hepatomegaly, splenomegaly or masses palpable. No evidence of hernia  Genitourinary:  External: Normal external female genitalia.  Normal urethral meatus, normal Bartholin's and Skene's glands.    Vagina: Normal vaginal mucosa, no evidence of prolapse.    Cervix: Grossly normal in appearance, no bleeding  Uterus: Non-enlarged, mobile, normal contour.  No CMT  Adnexa: ovaries non-enlarged, no adnexal masses  Rectal: deferred  Lymphatic: no evidence of inguinal lymphadenopathy Extremities: no edema, erythema, or tenderness Neurologic: Grossly intact Psychiatric: mood appropriate, affect full  Female chaperone present for pelvic and breast  portions of the physical exam    Assessment: 23 y.o. G1P0010 routine annual exam  Plan: Problem List Items Addressed This Visit    None    Visit Diagnoses    Healthcare maintenance    -  Primary   Cervical cancer screening       Relevant Orders   Cytology - PAP   Screen for STD (sexually transmitted disease)       Relevant Orders   Cytology - PAP   Birth control counseling       Adnexal pain       Relevant Orders   US PELVIS TRANSVAGINAL NON-OB (TV ONLY)   Beta hCG quant (ref lab)   Missed period       Relevant Orders   CBC   TSH + free T4   Beta hCG quant (ref lab)   Screening for iron deficiency anemia       Relevant Orders   CBC       1) 4) Gardasil Series discussed and if applicable offered to patient - Patient has previously completed 3 shot series   2) STI screening  was  offered and accepted  3)  ASCCP guidelines and rational discussed.  Patient opts for every 3 years screening interval  4) Contraception - the patient is currently using  none.  She is attempting to conceive in the near future We discussed safe sex practices to reduce her furture risk of STI's.    5) Missed period and RLQ pain, will obtain betahcg (urine pregnancy test negative) and [place].  6) Genetic familial cancer  screening offered and accepted.   6)  Return in about 2 weeks (around 11/11/2019) for GYN visit and US.  Adelene Idlerhristanna Schuman MD [facility] 10/28/2019 2:32 PM

## 2019-10-28 NOTE — Patient Instructions (Addendum)
Institute of Medicine Recommended Dietary Allowances for Calcium and Vitamin D  Age (yr) Calcium Recommended Dietary Allowance (mg/day) Vitamin D Recommended Dietary Allowance (international units/day)  9-18 1,300 600  19-50 1,000 600  51-70 1,200 600  71 and older 1,200 800  Data from Institute of Medicine. Dietary reference intakes: calcium, vitamin D. [place]: [employer]; 2011.   Therapists/Counselors/Psychologists   Karen Brunei Darussalamanada, LPC  & Jacqlyn KraussMichelle Van Horton    [phone]        [address]       [place], [place] [address]        Ival BibleGary Bailey, CSW [phone] [address] [place], [place] [address]  Harle BattiestJulia Tabor, WisconsinLPC        [phone]        [address], [address]      [place], [place] [address]        Chyrel Massonhevene Bryant, MS [phone] [address] [place], [place] [address]   Oscar LaJoanna Warren, LMFT       [phone]        [address]       [place], [place] [address]        Felecia Janina Thompson [phone] [address] [place], [place] [address]  Lester Carolinamanda Miller        [phone]        [address]       [place], [place] [address]        Kerin SalenBart McCormick [phone] [address] [place], [place] [address]  Tyron RussellCristin Saffo, PsyD       [phone]        [address]       [place], [place] [address]        Elita QuickCheryl Lawson, LPC [phone] [address]  [place], [place] [address]   Debarah CrapeCourtney Jones        [phone]        [address]      [place], [place] [address]         Rosana HoesLaura Ellington [facility] [phone] [email or web address]   Sation Konchella       [phone]        [address]       [place], [place] [address]        Morton StallCarmen Bork [facility] [phone] [email or web address]  Charity Care Program:  [email or web address]   [facility] [place] Same Day Access Hours:  Monday,  Wednesday and Friday, 8am - 3pm Walk-In Crisis Hours: 7 days/wk, 8am - 8pm [address], [place], [place] [address] [phone]  [employer] [phone]  Mobile Crisis Unit [phone]   Exercising to Stay Healthy To become healthy and stay healthy, it is recommended that you do moderate-intensity and vigorous-intensity exercise. You can tell that you are exercising at a moderate intensity if your heart starts beating faster and you start breathing faster but can still hold a conversation. You can tell that you are exercising at a vigorous intensity if you are breathing much harder and faster and cannot hold a conversation while exercising. Exercising regularly is important. It has many health benefits, such as:  Improving overall fitness, flexibility, and endurance.  Increasing bone density.  Helping with weight control.  Decreasing body fat.  Increasing muscle strength.  Reducing stress and tension.  Improving overall health. How often should I exercise? Choose an activity that you enjoy,  and set realistic goals. Your health care provider can help you make an activity plan that works for you. Exercise regularly as told by your health care provider. This may include:  Doing strength training two times a week, such as: ? Lifting weights. ? Using resistance bands. ? Push-ups. ? Sit-ups. ? Yoga.  Doing a certain intensity of exercise for a given amount of time. Choose from these options: ? A total of 150 minutes of moderate-intensity exercise every week. ? A total of 75 minutes of vigorous-intensity exercise every week. ? A mix of moderate-intensity and vigorous-intensity exercise every week. Children, pregnant women, people who have not exercised regularly, people who are overweight, and older adults may need to talk with a health care provider about what activities are safe to do. If you have a medical condition, be sure to talk with your health care  provider before you start a new exercise program. What are some exercise ideas? Moderate-intensity exercise ideas include:  Walking 1 mile (1.6 km) in about 15 minutes.  Biking.  Hiking.  Golfing.  Dancing.  Water aerobics. Vigorous-intensity exercise ideas include:  Walking 4.5 miles (7.2 km) or more in about 1 hour.  Jogging or running 5 miles (8 km) in about 1 hour.  Biking 10 miles (16.1 km) or more in about 1 hour.  Lap swimming.  Roller-skating or in-line skating.  Cross-country skiing.  Vigorous competitive sports, such as football, basketball, and soccer.  Jumping rope.  Aerobic dancing. What are some everyday activities that can help me to get exercise?  Yard work, such as: ? Pushing a Surveyor, mining. ? Raking and bagging leaves.  Washing your car.  Pushing a stroller.  Shoveling snow.  Gardening.  Washing windows or floors. How can I be more active in my day-to-day activities?  Use stairs instead of an elevator.  Take a walk during your lunch break.  If you drive, park your car farther away from your work or school.  If you take public transportation, get off one stop early and walk the rest of the way.  Stand up or walk around during all of your indoor phone calls.  Get up, stretch, and walk around every 30 minutes throughout the day.  Enjoy exercise with a friend. Support to continue exercising will help you keep a regular routine of activity. What guidelines can I follow while exercising?  Before you start a new exercise program, talk with your health care provider.  Do not exercise so much that you hurt yourself, feel dizzy, or get very short of breath.  Wear comfortable clothes and wear shoes with good support.  Drink plenty of water while you exercise to prevent dehydration or heat stroke.  Work out until your breathing and your heartbeat get faster. Where to find more information  U.S. Department of Health and Human Services:  ThisPath.fi  Centers for Disease Control and Prevention (CDC): [employer].com.br Summary  Exercising regularly is important. It will improve your overall fitness, flexibility, and endurance.  Regular exercise also will improve your overall health. It can help you control your weight, reduce stress, and improve your bone density.  Do not exercise so much that you hurt yourself, feel dizzy, or get very short of breath.  Before you start a new exercise program, talk with your health care provider. This information is not intended to replace advice given to you by your health care provider. Make sure you discuss any questions you have with your health care provider. Document  Released: 01/12/2011 Document Revised: 11/22/2017 Document Reviewed: 10/31/2017 Elsevier Patient Education  2020 [employer].

## 2019-10-29 ENCOUNTER — Telehealth: Payer: Self-pay

## 2019-10-29 LAB — CBC
Hematocrit: 40.1 % (ref 34.0–46.6)
Hemoglobin: 13.3 g/dL (ref 11.1–15.9)
MCH: 30.5 pg (ref 26.6–33.0)
MCHC: 33.2 g/dL (ref 31.5–35.7)
MCV: 92 fL (ref 79–97)
Platelets: 229 10*3/uL (ref 150–450)
RBC: 4.36 x10E6/uL (ref 3.77–5.28)
RDW: 12.8 % (ref 11.7–15.4)
WBC: 11.1 10*3/uL — ABNORMAL HIGH (ref 3.4–10.8)

## 2019-10-29 LAB — TSH+FREE T4
Free T4: 1.4 ng/dL (ref 0.82–1.77)
TSH: 0.693 u[IU]/mL (ref 0.450–4.500)

## 2019-10-29 LAB — BETA HCG QUANT (REF LAB): hCG Quant: <1 m[IU]/mL

## 2019-10-29 NOTE — Telephone Encounter (Signed)
Please advise 

## 2019-10-29 NOTE — Telephone Encounter (Signed)
Pt calling for DR Gilman Schmidt to discuss lab results

## 2019-10-29 NOTE — Progress Notes (Signed)
WNL- release to [employer]

## 2019-11-02 LAB — CYTOLOGY - PAP
Chlamydia: NEGATIVE
Comment: NEGATIVE
Comment: NEGATIVE
Comment: NORMAL
Diagnosis: NEGATIVE
Neisseria Gonorrhea: NEGATIVE
Trichomonas: NEGATIVE

## 2019-11-02 NOTE — Progress Notes (Signed)
Called and discussed with patient

## 2019-11-05 ENCOUNTER — Encounter: Payer: Self-pay | Admitting: Emergency Medicine

## 2019-11-05 ENCOUNTER — Other Ambulatory Visit: Payer: Self-pay

## 2019-11-05 DIAGNOSIS — F17210 Nicotine dependence, cigarettes, uncomplicated: Secondary | ICD-10-CM | POA: Insufficient documentation

## 2019-11-05 DIAGNOSIS — R519 Headache, unspecified: Secondary | ICD-10-CM | POA: Insufficient documentation

## 2019-11-05 DIAGNOSIS — T71193A Asphyxiation due to mechanical threat to breathing due to other causes, assault, initial encounter: Secondary | ICD-10-CM | POA: Insufficient documentation

## 2019-11-05 DIAGNOSIS — R0602 Shortness of breath: Secondary | ICD-10-CM | POA: Insufficient documentation

## 2019-11-05 LAB — POCT PREGNANCY, URINE: Preg Test, Ur: NEGATIVE

## 2019-11-05 NOTE — ED Notes (Signed)
Pt presentation discussed with EDP, Williams; no new orders at this time.
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
## 2019-11-06 ENCOUNTER — Emergency Department: Payer: Self-pay

## 2019-11-06 ENCOUNTER — Emergency Department
Admission: EM | Admit: 2019-11-06 | Discharge: 2019-11-06 | Disposition: A | Discharge status: Home / Self Care | Payer: Self-pay | Attending: Emergency Medicine | Admitting: Emergency Medicine

## 2019-11-06 LAB — BASIC METABOLIC PANEL
Anion gap: 9 (ref 5–15)
BUN: 14 mg/dL (ref 6–20)
CO2: 27 mmol/L (ref 22–32)
Calcium: 9.2 mg/dL (ref 8.9–10.3)
Chloride: 105 mmol/L (ref 98–111)
Creatinine, Ser: 0.84 mg/dL (ref 0.44–1.00)
GFR calc Af Amer: >60 mL/min (ref >60)
GFR calc non Af Amer: >60 mL/min (ref >60)
Glucose, Bld: 90 mg/dL (ref 70–99)
Potassium: 3.9 mmol/L (ref 3.5–5.1)
Sodium: 141 mmol/L (ref 135–145)

## 2019-11-06 LAB — CBC
HCT: 40.9 % (ref 36.0–46.0)
Hemoglobin: 13.8 g/dL (ref 12.0–15.0)
MCH: 30.4 pg (ref 26.0–34.0)
MCHC: 33.7 g/dL (ref 30.0–36.0)
MCV: 90.1 fL (ref 80.0–100.0)
Platelets: 239 10*3/uL (ref 150–400)
RBC: 4.54 MIL/uL (ref 3.87–5.11)
RDW: 12.9 % (ref 11.5–15.5)
WBC: 12.2 10*3/uL — ABNORMAL HIGH (ref 4.0–10.5)
nRBC: 0 % (ref 0.0–0.2)

## 2019-11-06 MED ORDER — MORPHINE SULFATE (PF) 2 MG/ML IV SOLN
2.0000 mg | Freq: Once | INTRAVENOUS | Status: AC
  Administered 2019-11-06: 01:00:00 2 mg via INTRAVENOUS
  Filled 2019-11-06: qty 1

## 2019-11-06 MED ORDER — IOHEXOL 350 MG/ML SOLN
75.0000 mL | Freq: Once | INTRAVENOUS | Status: AC | PRN
  Administered 2019-11-06: 75 mL via INTRAVENOUS
  Filled 2019-11-06: qty 75

## 2019-11-06 MED ORDER — TRAMADOL HCL 50 MG PO TABS: 50.0000 mg | ORAL_TABLET | Freq: Four times a day (QID) | ORAL | 0 refills | Status: DC | PRN

## 2019-11-06 NOTE — ED Notes (Signed)
Patient transported to CT 
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
## 2019-11-09 NOTE — Telephone Encounter (Signed)
Called pt to follow up on last Friday's msg, pt says she did not see msg. She did file 2019 taxes.

## 2019-11-16 ENCOUNTER — Ambulatory Visit: Payer: Medicaid Other | Admitting: Obstetrics and Gynecology

## 2019-11-16 ENCOUNTER — Ambulatory Visit: Payer: Medicaid Other

## 2019-11-16 ENCOUNTER — Other Ambulatory Visit: Payer: Self-pay

## 2019-11-26 ENCOUNTER — Encounter: Payer: Self-pay | Admitting: Obstetrics and Gynecology

## 2019-12-01 ENCOUNTER — Other Ambulatory Visit: Payer: Self-pay | Admitting: Family Medicine

## 2019-12-01 ENCOUNTER — Ambulatory Visit: Payer: Self-pay | Admitting: Physician Assistant

## 2019-12-01 ENCOUNTER — Other Ambulatory Visit: Payer: Self-pay

## 2019-12-01 DIAGNOSIS — Z299 Encounter for prophylactic measures, unspecified: Secondary | ICD-10-CM

## 2019-12-01 DIAGNOSIS — Z113 Encounter for screening for infections with a predominantly sexual mode of transmission: Secondary | ICD-10-CM

## 2019-12-01 LAB — WET PREP FOR TRICH, YEAST, CLUE
Trichomonas Exam: NEGATIVE
Yeast Exam: NEGATIVE

## 2019-12-01 MED ORDER — ACYCLOVIR 400 MG PO TABS: 400.0000 mg | ORAL_TABLET | Freq: Three times a day (TID) | ORAL | 0 refills | Status: AC

## 2019-12-02 ENCOUNTER — Encounter: Payer: Self-pay | Admitting: Physician Assistant
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
## 2019-12-03 LAB — GONOCOCCUS CULTURE

## 2019-12-04 ENCOUNTER — Telehealth: Payer: Self-pay | Admitting: Family Medicine

## 2019-12-04 NOTE — Telephone Encounter (Signed)
Needs to Talk to nurse about her lab work

## 2019-12-06 LAB — GONOCOCCUS CULTURE

## 2019-12-07 ENCOUNTER — Encounter: Payer: Self-pay | Admitting: Emergency Medicine

## 2019-12-07 ENCOUNTER — Other Ambulatory Visit: Payer: Self-pay

## 2019-12-07 ENCOUNTER — Emergency Department
Admission: EM | Admit: 2019-12-07 | Discharge: 2019-12-07 | Disposition: A | Discharge status: Home / Self Care | Payer: Medicaid Other | Attending: Emergency Medicine | Admitting: Emergency Medicine

## 2019-12-07 ENCOUNTER — Emergency Department: Payer: Medicaid Other

## 2019-12-07 DIAGNOSIS — J020 Streptococcal pharyngitis: Secondary | ICD-10-CM

## 2019-12-07 DIAGNOSIS — J069 Acute upper respiratory infection, unspecified: Secondary | ICD-10-CM | POA: Insufficient documentation

## 2019-12-07 DIAGNOSIS — Z20828 Contact with and (suspected) exposure to other viral communicable diseases: Secondary | ICD-10-CM | POA: Insufficient documentation

## 2019-12-07 DIAGNOSIS — R05 Cough: Secondary | ICD-10-CM | POA: Insufficient documentation

## 2019-12-07 DIAGNOSIS — F17210 Nicotine dependence, cigarettes, uncomplicated: Secondary | ICD-10-CM | POA: Insufficient documentation

## 2019-12-07 LAB — GROUP A STREP BY PCR: Group A Strep by PCR: DETECTED — AB

## 2019-12-07 MED ORDER — AMOXICILLIN 875 MG PO TABS: 875.0000 mg | ORAL_TABLET | Freq: Two times a day (BID) | ORAL | 0 refills | Status: AC

## 2019-12-07 NOTE — ED Notes (Signed)
See triage note. Pt reports cough for two weeks, sore throat, and chest discomfort. Resp: regular/unlabored; dry cough present.

## 2019-12-07 NOTE — Discharge Instructions (Signed)
Take amoxicillin twice a day for 10 days. Stay quarantined at home until COVID-19 results return.

## 2019-12-07 NOTE — Telephone Encounter (Signed)
Returned patient phone call. Patient has MyChart access and is questioning why a lab has been cancelled from her last visit of 12/01/2019. Identified patient and correct chart password. Available TR was given ans explained to patient unsure as to why Genital GC was not collected. RN spoke with provider C. [place], [place] which explained test was previously ordered but then cancelled secondary to other tests covering same ordered. Hal Morales, RN

## 2019-12-07 NOTE — ED Triage Notes (Signed)
Pt reports sore throat, congestion and cough for the past 2 weeks unrelieved by OTC medications.

## 2019-12-07 NOTE — ED Provider Notes (Signed)
Emergency Department Provider Note  ____________________________________________  Time seen: Approximately 6:32 PM  I have reviewed the triage vital signs and the nursing notes.   HISTORY  Chief Complaint Sore Throat, Cough, and Nasal Congestion   Historian Patient     HPI Michele Fields is a 23 y.o. female presents to the emergency department with 2 weeks of nasal congestion, nonproductive cough and pharyngitis.  Patient has been afebrile at home.  She reports that she has been around a sick contact with strep throat and would like to be tested for strep.  She denies chest pain, chest tightness or abdominal pain.  No emesis or diarrhea.  No other alleviating measures have been attempted.   Past Medical History:  Diagnosis Date  . ADHD (attention deficit hyperactivity disorder)   . Bacterial vaginosis   . BRCA negative 10/2019   MyRisk neg  . Family history of breast cancer   . Increased risk of breast cancer 10/2019   IBIS=25.3%/riskscore=20.2%  . Yeast infection      Immunizations up to date:  Yes.     Past Medical History:  Diagnosis Date  . ADHD (attention deficit hyperactivity disorder)   . Bacterial vaginosis   . BRCA negative 10/2019   MyRisk neg  . Family history of breast cancer   . Increased risk of breast cancer 10/2019   IBIS=25.3%/riskscore=20.2%  . Yeast infection     Patient Active Problem List   Diagnosis Date Noted  . Thyroid nodule 10/19/2014  . BMI (body mass index), pediatric, > 99% for age 48/23/2014  . ADD (attention deficit disorder) 07/15/2013  . Presence of subdermal contraceptive device 07/15/2013  . Headache(784.0) 07/15/2013  . Allergic rhinitis 07/15/2013  . Menorrhagia 06/19/2013  . Screening examination for venereal disease 06/19/2013  . Abdominal pain, other specified site 06/19/2013    Past Surgical History:  Procedure Laterality Date  . CHOLECYSTECTOMY    . NO PAST SURGERIES      Prior to Admission medications    Medication Sig Start Date End Date Taking? Authorizing Provider  acyclovir (ZOVIRAX) 400 MG tablet Take 1 tablet (400 mg total) by mouth 3 (three) times daily for 10 days. 12/01/19 12/11/19  Jerene Dilling, PA  amoxicillin (AMOXIL) 875 MG tablet Take 1 tablet (875 mg total) by mouth 2 (two) times daily for 10 days. 12/07/19 12/17/19  Lannie Fields, PA-C  etonogestrel-ethinyl estradiol (NUVARING) 0.12-0.015 MG/24HR vaginal ring Insert vaginally and leave in place for 3 consecutive weeks, then remove for 1 week. Patient not taking: Reported on 09/11/2019 11/20/17   Homero Fellers, MD  ondansetron (ZOFRAN ODT) 4 MG disintegrating tablet Take 1 tablet (4 mg total) by mouth every 8 (eight) hours as needed for nausea or vomiting. Patient not taking: Reported on 09/11/2019 04/24/18   Sherwood Gambler, MD  traMADol (ULTRAM) 50 MG tablet Take 1 tablet (50 mg total) by mouth every 6 (six) hours as needed. 11/06/19 11/05/20  Gregor Hams, MD    Allergies Other  Family History  Problem Relation Age of Onset  . Thyroid disease Paternal Grandmother   . Ovarian cancer Paternal Grandmother 51  . Breast cancer Maternal Aunt 32  . Breast cancer Other 96  . Cervical cancer Paternal Aunt 29    Social History Social History   Tobacco Use  . Smoking status: Current Every Day Smoker    Packs/day: 0.50    Types: Cigarettes  . Smokeless tobacco: Never Used  Substance Use Topics  . Alcohol  use: Yes  . Drug use: No      Review of Systems  Constitutional: Patient has been afebrile.  Eyes: No visual changes. No discharge ENT: Patient has congestion.  Cardiovascular: no chest pain. Respiratory: Patient has cough.  Gastrointestinal: No abdominal pain.  No nausea, no vomiting. Patient had diarrhea.  Genitourinary: Negative for dysuria. No hematuria Musculoskeletal: Patient has myalgias.  Skin: Negative for rash, abrasions, lacerations, ecchymosis. Neurological: Patient has headache, no  focal weakness or numbness.     ____________________________________________   PHYSICAL EXAM:  VITAL SIGNS: ED Triage Vitals  Enc Vitals Group     BP 12/07/19 1648 135/86     Pulse Rate 12/07/19 1648 87     Resp 12/07/19 1648 20     Temp 12/07/19 1650 98.6 F (37 C)     Temp Source 12/07/19 1650 Oral     SpO2 12/07/19 1648 97 %     Weight 12/07/19 1647 180 lb (81.6 kg)     Height 12/07/19 1647 _0  (1.6 m)     Head Circumference --      Peak Flow --      Pain Score 12/07/19 1646 9     Pain Loc --      Pain Edu? --      Excl. in [place]? --      Constitutional: Alert and oriented. Patient is lying supine. Eyes: Conjunctivae are normal. PERRL. EOMI. Head: Atraumatic. ENT:      Ears: Tympanic membranes are mildly injected with mild effusion bilaterally.       Nose: No congestion/rhinnorhea.      Mouth/Throat: Mucous membranes are moist. Posterior pharynx is mildly erythematous.  Hematological/Lymphatic/Immunilogical: No cervical lymphadenopathy.  Cardiovascular: Normal rate, regular rhythm. Normal S1 and S2.  Good peripheral circulation. Respiratory: Normal respiratory effort without tachypnea or retractions. Lungs CTAB. Good air entry to the bases with no decreased or absent breath sounds. Gastrointestinal: Bowel sounds 4 quadrants. Soft and nontender to palpation. No guarding or rigidity. No palpable masses. No distention. No CVA tenderness. Musculoskeletal: Full range of motion to all extremities. No gross deformities appreciated. Neurologic:  Normal speech and language. No gross focal neurologic deficits are appreciated.  Skin:  Skin is warm, dry and intact. No rash noted. Psychiatric: Mood and affect are normal. Speech and behavior are normal. Patient exhibits appropriate insight and judgement.    ____________________________________________   LABS (all labs ordered are listed, but only abnormal results are displayed)  Labs Reviewed  GROUP A STREP BY PCR -  Abnormal; Notable for the following components:      Result Value   Group A Strep by PCR DETECTED (*)    All other components within normal limits  SARS CORONAVIRUS 2 (TAT 6-24 HRS)   ____________________________________________  EKG   ____________________________________________  RADIOLOGY Unk Pinto, personally viewed and evaluated these images (plain radiographs) as part of my medical decision making, as well as reviewing the written report by the radiologist.  DG Chest 2 View  Result Date: 12/07/2019 CLINICAL DATA:  Cough and congestion EXAM: CHEST - 2 VIEW COMPARISON:  None. FINDINGS: Lungs are clear. Heart size and pulmonary vascularity are normal. No adenopathy. No bone lesions. IMPRESSION: No abnormality noted. Electronically Signed   By: Lowella Grip III M.D.   On: 12/07/2019 18:18    ____________________________________________    PROCEDURES  Procedure(s) performed:     Procedures     Medications - No data to display   ____________________________________________  INITIAL IMPRESSION / ASSESSMENT AND PLAN / ED COURSE  Pertinent labs & imaging results that were available during my care of the patient were reviewed by me and considered in my medical decision making (see chart for details).      Assessment and Plan:  Pharyngitis Unspecified viral URI 23 year old female presents to the emergency department with fever, rhinorrhea, nasal congestion and nonproductive cough.  Vital signs are reassuring at triage.  On physical exam, patient was resting comfortably and maintaining her own secretions.  Differential diagnosis included COVID-19, group A strep and unspecified viral URI.  Patient tested positive for group A strep in the emergency department.  COVID-19 testing is pending.  Rest and hydration were encouraged at home.  Patient was discharged with amoxicillin.  Return precautions were given to return with new or worse symptoms.  All  patient questions were answered.   ____________________________________________  FINAL CLINICAL IMPRESSION(S) / ED DIAGNOSES  Final diagnoses:  Viral upper respiratory tract infection  Strep throat      NEW MEDICATIONS STARTED DURING THIS VISIT:  ED Discharge Orders         Ordered    amoxicillin (AMOXIL) 875 MG tablet  2 times daily     12/07/19 2005              This chart was dictated using voice recognition software/Dragon. Despite best efforts to proofread, errors can occur which can change the meaning. Any change was purely unintentional.     Karren Cobble 12/07/19 2022    Nance Pear, MD 12/07/19 2114

## 2019-12-08 LAB — SARS CORONAVIRUS 2 (TAT 6-24 HRS): SARS Coronavirus 2: NEGATIVE

## 2019-12-11 ENCOUNTER — Telehealth: Payer: Self-pay

## 2019-12-11 DIAGNOSIS — A749 Chlamydial infection, unspecified: Secondary | ICD-10-CM

## 2019-12-11 NOTE — Telephone Encounter (Signed)
TC to patient. Verified ID via password/SS#. Informed of positive chlamydia and need for tx. Instructed to eat before visit and have partner call for tx appt. Appt scheduled for 12/14/2019.Aileen Fass, RN

## 2019-12-14 ENCOUNTER — Ambulatory Visit: Payer: Self-pay

## 2019-12-14 ENCOUNTER — Other Ambulatory Visit: Payer: Self-pay

## 2019-12-14 DIAGNOSIS — A5602 Chlamydial vulvovaginitis: Secondary | ICD-10-CM

## 2019-12-14 MED ORDER — AZITHROMYCIN 500 MG PO TABS
500.0000 mg | ORAL_TABLET | Freq: Once | ORAL | Status: AC
  Administered 2019-12-14: 1000 mg via ORAL

## 2019-12-16 ENCOUNTER — Other Ambulatory Visit: Payer: Self-pay

## 2019-12-16 ENCOUNTER — Ambulatory Visit: Payer: Self-pay

## 2019-12-16 DIAGNOSIS — A749 Chlamydial infection, unspecified: Secondary | ICD-10-CM

## 2019-12-16 MED ORDER — AZITHROMYCIN 500 MG PO TABS
1000.0000 mg | ORAL_TABLET | Freq: Once | ORAL | Status: AC
  Administered 2019-12-16: 1000 mg via ORAL

## 2019-12-19 NOTE — Progress Notes (Signed)
Patient tx'd for chlamydia per SO. Patient returned to clinic today 12/16/19 d/t vomiting treatment shortly after leaving clinic on 12/14/19 Aileen Fass, RN

## 2019-12-25 NOTE — L&D Delivery Note (Deleted)
Cesarean Section Procedure Note  Indications: failure to progress: arrest of dilation, non-reassuring fetal status and prolonged rupture of membranes  Pre-operative Diagnosis: 39 week 0 day pregnancy.  Post-operative Diagnosis: same  Surgeon: Kanylah Muench   Assistants: Daniela Paul, CNM  Anesthesia: Epidural anesthesia  Procedure Details   The patient was seen in the Holding Room. The risks, benefits, complications, treatment options, and expected outcomes were discussed with the patient.  The patient concurred with the proposed plan, giving informed consent.  The site of surgery properly noted/marked. The patient was taken to Operating Room, identified as Saki Mandel and the procedure verified as C-Section Delivery. A Time Out was held and the above information confirmed.  After induction of anesthesia, the patient was draped and prepped in the usual sterile manner. A Pfannenstiel incision was made and carried down through the subcutaneous tissue to the fascia. Fascial incision was made and extended transversely. The fascia was separated from the underlying rectus tissue superiorly and inferiorly. The peritoneum was identified and entered. Peritoneal incision was extended longitudinally.  A low transverse uterine incision was made. Delivered from cephalic (brow) presentation was a 3605 gram Female with Apgar scores of 9 at one minute and 9 at five minutes. After the umbilical cord was clamped and cut cord blood was obtained for evaluation. The placenta was removed intact and appeared normal. The uterine outline, tubes and ovaries appeared normal.  Hemabate x 1 dose was given for uterine atony. The uterine incision was closed with running locked sutures of 0-Monocryl in two layers. Hemostasis was observed. Lavage was carried out until clear.  The peritoneum was closed with 2-0 Vicryl. The fascia was then reapproximated with running sutures of 0-PDS.   The subcutaneous tissue was closed with  3-0 Monocryl. The skin was reapproximated with 4-0 Vicryl.  Instrument, sponge, and needle counts were correct prior the abdominal closure and at the conclusion of the case.   Findings: Normal-appearing uterus, fallopian tubes, and bilateral ovaries.  Amniotic fluid clear.  Fetus in cephalic presentation (brow).  Quantitative Blood Loss:  233cc         Drains: Foley         Total IV Fluids:  2000ml         Specimens: Placenta and Disposition:  Sent to Pathology          Implants: None         Complications:  None; patient tolerated the procedure well.         Disposition: PACU - hemodynamically stable.         Condition: stable  Orlandus Borowski, DO  

## 2019-12-28 ENCOUNTER — Ambulatory Visit (INDEPENDENT_AMBULATORY_CARE_PROVIDER_SITE_OTHER): Payer: Self-pay

## 2019-12-28 ENCOUNTER — Ambulatory Visit (INDEPENDENT_AMBULATORY_CARE_PROVIDER_SITE_OTHER): Payer: Self-pay | Admitting: Obstetrics and Gynecology

## 2019-12-28 ENCOUNTER — Other Ambulatory Visit: Payer: Self-pay

## 2019-12-28 ENCOUNTER — Encounter: Payer: Self-pay | Admitting: Obstetrics and Gynecology

## 2019-12-28 VITALS — BP 110/70 | Ht 63.0 in | Wt 180.0 lb

## 2019-12-28 DIAGNOSIS — N912 Amenorrhea, unspecified: Secondary | ICD-10-CM

## 2019-12-28 DIAGNOSIS — R102 Pelvic and perineal pain: Secondary | ICD-10-CM

## 2019-12-28 DIAGNOSIS — A6009 Herpesviral infection of other urogenital tract: Secondary | ICD-10-CM

## 2019-12-28 MED ORDER — ACYCLOVIR 400 MG PO TABS: 400.0000 mg | ORAL_TABLET | Freq: Three times a day (TID) | ORAL | 0 refills | Status: AC

## 2019-12-28 NOTE — Progress Notes (Signed)
Patient ID: Michele Fields, female   DOB: Aug 06, 1996, 24 y.o.   MRN: [id number]  Reason for Consult: Follow-up (F/U on irregular periods/myrisk results )   Referred by Adrian Prows R, *  Subjective:     HPI:  Michele Fields is a 24 y.o. female. She presents today fo a follow up of her irregular menstrual periods and her myrisk test results.   She reports that she has not had a period in four months since stopping the [employer].  She stopped birth control in an attempt to conceive.   She recently tested positive for chlamydia and was treated. She refrained from intercourse for the recommended amount of time.   She also had an exposure to herpes and believes that she had an outbreak. She had multiple small sores on her labia which were very painful last week. Her symptoms have improved but not resolved.   Past Medical History:  Diagnosis Date  . ADHD (attention deficit hyperactivity disorder)   . Bacterial vaginosis   . BRCA negative 10/2019   MyRisk neg  . Family history of breast cancer   . Increased risk of breast cancer 10/2019   IBIS=25.3%/riskscore=20.2%  . Yeast infection    Family History  Problem Relation Age of Onset  . Thyroid disease Paternal Grandmother   . Ovarian cancer Paternal Grandmother 4  . Breast cancer Maternal Aunt 32  . Breast cancer Other 38  . Cervical cancer Paternal Aunt 66   Past Surgical History:  Procedure Laterality Date  . CHOLECYSTECTOMY    . NO PAST SURGERIES      Short Social History:  Social History   Tobacco Use  . Smoking status: Current Every Day Smoker    Packs/day: 0.50    Types: Cigarettes  . Smokeless tobacco: Never Used  Substance Use Topics  . Alcohol use: Yes    Allergies  Allergen Reactions  . Other Other (See Comments)    Seasonal Allergies    Current Outpatient Medications  Medication Sig Dispense Refill  . acyclovir (ZOVIRAX) 400 MG tablet Take 1 tablet (400 mg total) by mouth 3 (three) times daily  for 7 days. 21 tablet 0  . etonogestrel-ethinyl estradiol (NUVARING) 0.12-0.015 MG/24HR vaginal ring Insert vaginally and leave in place for 3 consecutive weeks, then remove for 1 week. (Patient not taking: Reported on 09/11/2019) 1 each 12  . ondansetron (ZOFRAN ODT) 4 MG disintegrating tablet Take 1 tablet (4 mg total) by mouth every 8 (eight) hours as needed for nausea or vomiting. (Patient not taking: Reported on 09/11/2019) 10 tablet 0  . traMADol (ULTRAM) 50 MG tablet Take 1 tablet (50 mg total) by mouth every 6 (six) hours as needed. (Patient not taking: Reported on 12/28/2019) 15 tablet 0   No current facility-administered medications for this visit.    Review of Systems  Constitutional: Negative for chills, fatigue, fever and unexpected weight change.  HENT: Negative for trouble swallowing.  Eyes: Negative for loss of vision.  Respiratory: Negative for cough, shortness of breath and wheezing.  Cardiovascular: Negative for chest pain, leg swelling, palpitations and syncope.  GI: Negative for abdominal pain, blood in stool, diarrhea, nausea and vomiting.  GU: Negative for difficulty urinating, dysuria, frequency and hematuria.  Musculoskeletal: Negative for back pain, leg pain and joint pain.  Skin: Negative for rash.  Neurological: Negative for dizziness, headaches, light-headedness, numbness and seizures.  Psychiatric: Negative for behavioral problem, confusion, depressed mood and sleep disturbance.  Objective:  Objective   Vitals:   12/28/19 1338  BP: 110/70  Weight: 180 lb (81.6 kg)  Height: '5\' 3"'$  (1.6 m)   Body mass index is 31.89 kg/m.  Physical Exam Vitals and nursing note reviewed.  Constitutional:      Appearance: She is well-developed.  HENT:     Head: Normocephalic and atraumatic.  Eyes:     Pupils: Pupils are equal, round, and reactive to light.  Cardiovascular:     Rate and Rhythm: Normal rate and regular rhythm.  Pulmonary:     Effort: Pulmonary  effort is normal. No respiratory distress.  Skin:    General: Skin is warm and dry.  Neurological:     Mental Status: She is alert and oriented to person, place, and time.  Psychiatric:        Behavior: Behavior normal.        Thought Content: Thought content normal.        Judgment: Judgment normal.        Assessment/Plan:    24 yo 1. Amenorrhea:  Pregnancy test today.  PCOS appearance of ovaries. Will obtain further laboratory testing for evaluation of amenorrhea. TSH previously normal.  Discussed restarting NuvaRing or helping with ovulation induction medications if she desires to conceive. She will consider these options and decide if she would like to proceed with either therapy. Discussed that 10% weight loss can help with return to ovulation.   2. 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 Will treat with acyclovir. Will obtain HSV serology. Discussed treatment options, prophylaxis and episodic treatment of outbreaks.    3. Recent Chlamydia infections- encouraged repeat labs in 3 months for test of cure.   4 Discussed breast cancer screening- No mutation identified but > 20% lifetime risk of breast cancer. Recommended initiating annual breast MRI at 23 yo, and initiating Mammogram annually at 50.  Reviewed information with Aveline and provided her with the [facility] test result folder.   More than 25 minutes were spent face to face with the patient in the room with more than 50% of the time spent providing counseling and discussing the plan of management.   Adrian Prows MD [facility] 12/28/2019 2:29 PM

## 2019-12-28 NOTE — Patient Instructions (Signed)

## 2019-12-30 LAB — HSV(HERPES SMPLX)ABS-I+II(IGG+IGM)-BLD
HSV 1 Glycoprotein G Ab, IgG: 44 index — ABNORMAL HIGH (ref 0.00–0.90)
HSV 2 IgG, Type Spec: >23.6 index — ABNORMAL HIGH (ref 0.00–0.90)
HSVI/II Comb IgM: 2.3 Ratio — ABNORMAL HIGH (ref 0.00–0.90)

## 2019-12-31 NOTE — Telephone Encounter (Signed)
Please call to schedule a visit for this patient. Thank you!

## 2019-12-31 NOTE — Telephone Encounter (Signed)
Patient is schedule for 01/15/20

## 2020-01-01 LAB — DHEA-SULFATE: DHEA-SO4: 270 ug/dL (ref 110.0–431.7)

## 2020-01-01 LAB — FSH/LH
FSH: 4.1 m[IU]/mL
LH: 13.6 m[IU]/mL

## 2020-01-01 LAB — PROLACTIN: Prolactin: 7.2 ng/mL (ref 4.8–23.3)

## 2020-01-01 LAB — 17-HYDROXYPROGESTERONE: 17-Hydroxyprogesterone: 55 ng/dL

## 2020-01-15 ENCOUNTER — Ambulatory Visit (INDEPENDENT_AMBULATORY_CARE_PROVIDER_SITE_OTHER): Payer: Self-pay | Admitting: Obstetrics and Gynecology

## 2020-01-15 ENCOUNTER — Other Ambulatory Visit: Payer: Self-pay

## 2020-01-15 ENCOUNTER — Encounter: Payer: Self-pay | Admitting: Obstetrics and Gynecology

## 2020-01-15 VITALS — BP 110/68 | Ht 63.0 in | Wt 181.0 lb

## 2020-01-15 DIAGNOSIS — E282 Polycystic ovarian syndrome: Secondary | ICD-10-CM

## 2020-01-15 MED ORDER — LETROZOLE 2.5 MG PO TABS: 2.5000 mg | ORAL_TABLET | Freq: Every day | ORAL | 0 refills | Status: DC

## 2020-01-15 MED ORDER — MEDROXYPROGESTERONE ACETATE 10 MG PO TABS: 10.0000 mg | ORAL_TABLET | Freq: Every day | ORAL | 6 refills | Status: DC

## 2020-01-15 NOTE — Patient Instructions (Signed)
Ovulation Induction Instructions/Schedule: To use with letrozole (Femara)  1. Day 1 of your cycle is the first day of bleeding, whether it happens with a naturally occurring period or is induced with progesterone. Starting on Day 3 of the cycle, take the prescribed medication  Letrozole (Femara) for five consecutive days.   2. Starting on Day 9, you should check your urine daily for ovulation with a commercially available ovulation predictor kit. We recommend the Clear Blue Easy kit for all of our patients because it is easy to interpret. It is available at amazon.com for low cost. There will be a "smiley face" that appears on the day of "surge," which is release of LH (leutenizing hormone). This hormone triggers ovulation. Most women will ovulate, or release an egg, 24 hours after this trigger. Other commercially available kits use a system similar to a pregnancy test kit, with a control line and a test line. When the test line brightness of color is equivalent to that of the control line, the result is positive.   3. Begin timed intercourse on Day 9. Specifically, we recommend that you have intercourse every 36-48 hours for 5 days prior to ovulation and for 5 days after. The most fertile time will be 24 hours after the "smiley face" appears on your kit, usually around Day 14 for women who have a 28 day cycle.   4. If you do not have a "smiley face" or color change on your ovulation predictor kit during your cycle, please contact us. A blood test for progesterone needs to be drawn at approximately Days 22-24 of your cycle. This will help us determine whether or not the kit is functioning properly and whether or not you are truly ovulating. It will also help us to determine if the dose of your medication needs to adjustment.   5. If no menstrual bleeding has occurred by Day 35, please check a pregnancy test and call us with the results.   6. If you require medication to have a period, then this will be  artificially induced with progesterone for each cycle, and prescribed by your doctor. Take one tablet of medroxyprogesterone acetate, 10 mg, per day for ten days. The bleeding/period should arrive within 1-5 days of finishing the tablets. The first day of bleeding is Day 1.   7. PLEASE NOTE that careful timing is required to use these medications and appropriately time your cycles. We rely on you, the patient, to time your cycles and to keep us informed. GET A CALENDAR! There are wonderful apps available for your mobile phone or laptop. One that we like is fertility friend, available at fertilityfriend.com.    8. Occasionally it is unclear whether or not ovulation has occurred. If you are unsure, please call us. Your doctor or one of our nurses will guide you to the next step, which will be either a blood test Day 22-24 for progesterone levels or may involve ultrasound to look for mature follicles (small cysts that can release an egg at the time of ovulation).  9. There is a great book, Taking Charge of Your Fertility, by Toni Weschler, that can help you to understand the hormonal nature of the menstrual cycle. The more knowledge you have about how conception occurs, the better you will understand these prescribed treatments. Knowledge will help you to work with us on achieving a conception as efficiently and as quickly as possible!   10. Generally, most patients will use ovulation induction medications for 6 cycles.   If unsuccessful, your doctor will talk to you about moving to the next step.  

## 2020-01-15 NOTE — Progress Notes (Signed)
Patient ID: Michele Fields, female   DOB: Sep 22, 1996, 24 y.o.   MRN: [id number]  Reason for Consult: Follow-up (Period started on 01/02/20 and went until 01/06/20)   Referred by No ref. provider found  Subjective:     HPI:  Michele Fields is a 24 y.o. female. She is here to review ovulation induction. She would like to conceive. She has stopped smoking cigarettes and is working on stopping marijuana. She has irregular periods secondary to PCOS. She has tried to become pregnant before on her own and has not been successful.   Past Medical History:  Diagnosis Date  . ADHD (attention deficit hyperactivity disorder)   . Bacterial vaginosis   . BRCA negative 10/2019   MyRisk neg  . Family history of breast cancer   . Increased risk of breast cancer 10/2019   IBIS=25.3%/riskscore=20.2%  . Yeast infection    Family History  Problem Relation Age of Onset  . Thyroid disease Paternal Grandmother   . Ovarian cancer Paternal Grandmother 9  . Breast cancer Maternal Aunt 32  . Breast cancer Other 82  . Cervical cancer Paternal Aunt 61   Past Surgical History:  Procedure Laterality Date  . CHOLECYSTECTOMY    . NO PAST SURGERIES      Short Social History:  Social History   Tobacco Use  . Smoking status: Current Every Day Smoker    Packs/day: 0.50    Types: Cigarettes  . Smokeless tobacco: Never Used  Substance Use Topics  . Alcohol use: Yes    Allergies  Allergen Reactions  . Other Other (See Comments)    Seasonal Allergies    Current Outpatient Medications  Medication Sig Dispense Refill  . letrozole (FEMARA) 2.5 MG tablet Take 1 tablet (2.5 mg total) by mouth daily. 5 tablet 0  . medroxyPROGESTERone (PROVERA) 10 MG tablet Take 1 tablet (10 mg total) by mouth daily for 10 days. Start after first 14 days of premarin 10 tablet 6   No current facility-administered medications for this visit.    Review of Systems  Constitutional: Negative for chills, fatigue, fever and  unexpected weight change.  HENT: Negative for trouble swallowing.  Eyes: Negative for loss of vision.  Respiratory: Negative for cough, shortness of breath and wheezing.  Cardiovascular: Negative for chest pain, leg swelling, palpitations and syncope.  GI: Negative for abdominal pain, blood in stool, diarrhea, nausea and vomiting.  GU: Negative for difficulty urinating, dysuria, frequency and hematuria.  Musculoskeletal: Negative for back pain, leg pain and joint pain.  Skin: Negative for rash.  Neurological: Negative for dizziness, headaches, light-headedness, numbness and seizures.  Psychiatric: Negative for behavioral problem, confusion, depressed mood and sleep disturbance.        Objective:  Objective   Vitals:   01/15/20 1401  BP: 110/68  Weight: 181 lb (82.1 kg)  Height: '5\' 3"'$  (1.6 m)   Body mass index is 32.06 kg/m.  Physical Exam Vitals and nursing note reviewed.  Constitutional:      Appearance: She is well-developed.  HENT:     Head: Normocephalic and atraumatic.  Eyes:     Pupils: Pupils are equal, round, and reactive to light.  Cardiovascular:     Rate and Rhythm: Normal rate and regular rhythm.  Pulmonary:     Effort: Pulmonary effort is normal. No respiratory distress.  Skin:    General: Skin is warm and dry.  Neurological:     Mental Status: She is alert and oriented to person, place,  and time.  Psychiatric:        Behavior: Behavior normal.        Thought Content: Thought content normal.        Judgment: Judgment normal.         Assessment/Plan:     24 yo with PCOS who desires to conceive.  Start PNV. Given samples.  Encouraged smoking cessation Discussed Letrozole administration for ovulation. Discussed timing of medication in detail and provided patient with written instructions. Encouraged her to get a calendar to track important dates. Reviewed ovulation induction instructions. She had a period on 01/02/2020. Discussed that she is currently  in her fertile window. She will attempt conception. If she does not start her period by Feb 13h she will take a home pregnancy test. If positive she will call the office. If negative she will start provera to induce a period and then start Letrozole. Starting with 2.'5mg'$  dose.  Follow up in March  More than 25 minutes were spent face to face with the patient in the room with more than 50% of the time spent providing counseling and discussing the plan of management.  Adrian Prows MD [facility], [facility] 01/15/2020 7:11 PM

## 2020-01-31 ENCOUNTER — Other Ambulatory Visit: Payer: Self-pay

## 2020-01-31 ENCOUNTER — Emergency Department
Admission: EM | Admit: 2020-01-31 | Discharge: 2020-01-31 | Disposition: A | Discharge status: Home / Self Care | Payer: Medicaid Other | Attending: Emergency Medicine | Admitting: Emergency Medicine

## 2020-01-31 DIAGNOSIS — F17210 Nicotine dependence, cigarettes, uncomplicated: Secondary | ICD-10-CM | POA: Insufficient documentation

## 2020-01-31 DIAGNOSIS — Z79899 Other long term (current) drug therapy: Secondary | ICD-10-CM | POA: Insufficient documentation

## 2020-01-31 DIAGNOSIS — Z202 Contact with and (suspected) exposure to infections with a predominantly sexual mode of transmission: Secondary | ICD-10-CM | POA: Insufficient documentation

## 2020-01-31 DIAGNOSIS — R300 Dysuria: Secondary | ICD-10-CM | POA: Insufficient documentation

## 2020-01-31 LAB — WET PREP, GENITAL
Clue Cells Wet Prep HPF POC: NONE SEEN
Sperm: NONE SEEN
Trich, Wet Prep: NONE SEEN
Yeast Wet Prep HPF POC: NONE SEEN

## 2020-01-31 LAB — POCT PREGNANCY, URINE: Preg Test, Ur: NEGATIVE

## 2020-01-31 LAB — HIV ANTIBODY (ROUTINE TESTING W REFLEX): HIV Screen 4th Generation wRfx: NONREACTIVE

## 2020-01-31 LAB — RPR: RPR Ser Ql: NONREACTIVE

## 2020-01-31 MED ORDER — DOXYCYCLINE HYCLATE 100 MG PO TABS
100.0000 mg | ORAL_TABLET | Freq: Once | ORAL | Status: AC
  Administered 2020-01-31: 100 mg via ORAL
  Filled 2020-01-31: qty 1

## 2020-01-31 MED ORDER — CEFTRIAXONE SODIUM 1 G IJ SOLR
500.0000 mg | Freq: Once | INTRAMUSCULAR | Status: AC
  Administered 2020-01-31: 500 mg via INTRAMUSCULAR
  Filled 2020-01-31: qty 10

## 2020-01-31 MED ORDER — ONDANSETRON 4 MG PO TBDP: 4.0000 mg | ORAL_TABLET | Freq: Once | ORAL | Status: DC

## 2020-01-31 MED ORDER — AZITHROMYCIN 500 MG PO TABS: 1000.0000 mg | ORAL_TABLET | ORAL | Status: DC

## 2020-01-31 MED ORDER — CEFTRIAXONE SODIUM 250 MG IJ SOLR: 250.0000 mg | Freq: Once | INTRAMUSCULAR | Status: DC

## 2020-01-31 MED ORDER — DOXYCYCLINE HYCLATE 100 MG PO CAPS: 100.0000 mg | ORAL_CAPSULE | Freq: Two times a day (BID) | ORAL | 0 refills | Status: AC

## 2020-01-31 NOTE — ED Provider Notes (Signed)
[facility] Emergency Department Provider Note  ____________________________________________   First MD Initiated Contact with Patient 01/31/20 0159     (approximate)  I have reviewed the triage vital signs and the nursing notes.   HISTORY  Chief Complaint SEXUALLY TRANSMITTED DISEASE    HPI Michele Fields is a 24 y.o. female with medical history as listed below who presents for evaluation after probable STD exposure.  She found out tonight that her boyfriend, with whom she has unprotected sex, has multiple partners of both genders with whom he also does not use condoms.  Reportedly at least one of his partners has a known sexually transmitted disease although she does not know which one.  She also reportedly was sent photographs confirming his infidelity.  She has no symptoms at this time other than some mild intermittent dysuria.  She denies abdominal pain, pelvic pain, fever/chills, chest pain or shortness of breath, nausea, and vomiting.         Past Medical History:  Diagnosis Date  . ADHD (attention deficit hyperactivity disorder)   . Bacterial vaginosis   . BRCA negative 10/2019   MyRisk neg  . Family history of breast cancer   . Increased risk of breast cancer 10/2019   IBIS=25.3%/riskscore=20.2%  . Yeast infection     Patient Active Problem List   Diagnosis Date Noted  . Thyroid nodule 10/19/2014  . BMI (body mass index), pediatric, > 99% for age 62/23/2014  . ADD (attention deficit disorder) 07/15/2013  . Presence of subdermal contraceptive device 07/15/2013  . Headache(784.0) 07/15/2013  . Allergic rhinitis 07/15/2013  . Menorrhagia 06/19/2013  . Screening examination for venereal disease 06/19/2013  . Abdominal pain, other specified site 06/19/2013    Past Surgical History:  Procedure Laterality Date  . CHOLECYSTECTOMY    . NO PAST SURGERIES      Prior to Admission medications   Medication Sig Start Date End Date Taking?  Authorizing Provider  doxycycline (VIBRAMYCIN) 100 MG capsule Take 1 capsule (100 mg total) by mouth 2 (two) times daily for 7 days. 01/31/20 02/07/20  Hinda Kehr, MD  letrozole [facility]) 2.5 MG tablet Take 1 tablet (2.5 mg total) by mouth daily. 01/15/20   Schuman, Stefanie Libel, MD  medroxyPROGESTERone (PROVERA) 10 MG tablet Take 1 tablet (10 mg total) by mouth daily for 10 days. Start after first 14 days of premarin 01/15/20 01/25/20  Homero Fellers, MD    Allergies Other  Family History  Problem Relation Age of Onset  . Thyroid disease Paternal Grandmother   . Ovarian cancer Paternal Grandmother 73  . Breast cancer Maternal Aunt 32  . Breast cancer Other 53  . Cervical cancer Paternal Aunt 36    Social History Social History   Tobacco Use  . Smoking status: Current Every Day Smoker    Packs/day: 0.50    Types: Cigarettes  . Smokeless tobacco: Never Used  Substance Use Topics  . Alcohol use: Yes  . Drug use: No    Review of Systems Constitutional: No fever/chills Eyes: No visual changes. ENT: No sore throat. Cardiovascular: Denies chest pain. Respiratory: Denies shortness of breath. Gastrointestinal: No abdominal pain.  No nausea, no vomiting.  No diarrhea.  No constipation. Genitourinary: Mild dysuria.  No excessive vaginal discomfort or discharge. Musculoskeletal: Negative for neck pain.  Negative for back pain. Integumentary: Negative for rash. Neurological: Negative for headaches, focal weakness or numbness.   ____________________________________________   PHYSICAL EXAM:  VITAL SIGNS: ED Triage Vitals  Enc Vitals Group     BP 01/31/20 0144 (!) 160/99     Pulse Rate 01/31/20 0144 (!) 125     Resp 01/31/20 0144 20     Temp 01/31/20 0144 99 F (37.2 C)     Temp Source 01/31/20 0144 Oral     SpO2 01/31/20 0144 98 %     Weight 01/31/20 0144 81.6 kg (180 lb)     Height 01/31/20 0144 1.6 m ('5\' 3"'$ )     Head Circumference --      Peak Flow --      Pain  Score 01/31/20 0143 0     Pain Loc --      Pain Edu? --      Excl. in [place]? --     Constitutional: Alert and oriented.  Emotionally upset but understandable under the circumstances. Eyes: Conjunctivae are normal.  Head: Atraumatic. Nose: No congestion/rhinnorhea. Mouth/Throat: Patient is wearing a mask. Neck: No stridor.  No meningeal signs.   Cardiovascular: Tachycardia (but upset), regular rhythm. Good peripheral circulation. Grossly normal heart sounds. Respiratory: Normal respiratory effort.  No retractions. Gastrointestinal: Soft and nontender. No distention.  Genitourinary: Deferred as per joint decision-making with the patient. Neurologic:  Normal speech and language. No gross focal neurologic deficits are appreciated.  Skin:  Skin is warm, dry and intact. Psychiatric: Mood and affect are angry and upset given the news she just received, but otherwise appropriate.  No emergent or warning signs/symptoms.  ____________________________________________   LABS (all labs ordered are listed, but only abnormal results are displayed)  Labs Reviewed  WET PREP, GENITAL - Abnormal; Notable for the following components:      Result Value   WBC, Wet Prep HPF POC RARE (*)    All other components within normal limits  GC/CHLAMYDIA PROBE AMP  RPR  HIV ANTIBODY (ROUTINE TESTING W REFLEX)  POC URINE PREG, ED  POCT PREGNANCY, URINE   ____________________________________________  EKG  No indication for EKG ____________________________________________  RADIOLOGY I, Hinda Kehr, personally viewed and evaluated these images (plain radiographs) as part of my medical decision making, as well as reviewing the written report by the radiologist.  ED MD interpretation: No indication for emergent imaging  Official radiology report(s): No results found.  ____________________________________________   PROCEDURES   Procedure(s) performed (including Critical Care):  Procedures    ____________________________________________   INITIAL IMPRESSION / MDM / [place] / ED COURSE  As part of my medical decision making, I reviewed the following data within the [place] notes reviewed and incorporated, Labs reviewed , Old chart reviewed and Notes from prior ED visits   The patient is certainly at high risk of STD based on her history.  She would like empiric treatment which I think is appropriate so I will treat as per current recommendations with ceftriaxone 500 mg intramuscular and a 7-day course of doxycycline 100 mg twice daily.  We are sending the urine GC/chlamydia test, RPR, HIV, and a self collection of a wet prep swab.  I explained that without any significant signs or symptoms that would suggest cervicitis, I think that a pelvic exam is not necessary since we checked the GC/committee with a urine specimen now, and she agrees that she does not want the exam if it is not absolutely necessary.  I will await the results of the wet prep and then discharge her with medications as listed below.  She knows to check the rest of the results  in [place].      Clinical Course as of Jan 30 310  Sun Jan 31, 2020  0218 Wet prep is negative.  Patient has received her ceftriaxone) cycling.  She knows to follow-up in my chart and go to the health department as needed.  Wet prep, genital(!) [CF]  0240 Preg Test, Ur: NEGATIVE [CF]    Clinical Course User Index [CF] Hinda Kehr, MD     ____________________________________________  FINAL CLINICAL IMPRESSION(S) / ED DIAGNOSES  Final diagnoses:  STD exposure     MEDICATIONS GIVEN DURING THIS VISIT:  Medications  cefTRIAXone (ROCEPHIN) injection 500 mg (500 mg Intramuscular Given 01/31/20 0240)  doxycycline (VIBRA-TABS) tablet 100 mg (100 mg Oral Given 01/31/20 0240)     ED Discharge Orders         Ordered    doxycycline (VIBRAMYCIN) 100 MG capsule  2 times daily     01/31/20 8485           *Please note:  Lakima Montesinos was evaluated in Emergency Department on 01/31/2020 for the symptoms described in the history of present illness. She was evaluated in the context of the global COVID-19 pandemic, which necessitated consideration that the patient might be at risk for infection with the SARS-CoV-2 virus that causes COVID-19. Institutional protocols and algorithms that pertain to the evaluation of patients at risk for COVID-19 are in a state of rapid change based on information released by regulatory bodies including the CDC and federal and state organizations. These policies and algorithms were followed during the patient's care in the ED.  Some ED evaluations and interventions may be delayed as a result of limited staffing during the pandemic.*  Note:  This document was prepared using Dragon voice recognition software and may include unintentional dictation errors.   Hinda Kehr, MD 01/31/20 [phone]

## 2020-01-31 NOTE — Discharge Instructions (Addendum)
Please take the full course of prescribed antibiotics.  We treated you for gonorrhea and chlamydia given your exposure, but I recommend that you follow-up your results in MyChart to see about the results of your syphilis (RPR) and HIV tests.  You can follow-up at the illness getting health department for more questions or concerns or if either of these 2 tests come back positive.  Remember that even though you have been treated for gonorrhea and chlamydia tonight, he can still get the infection again by having unprotected sex with an infected partner.

## 2020-01-31 NOTE — ED Triage Notes (Signed)
Patient recently found out boyfriend has been cheating and out like to be checked out.

## 2020-02-02 LAB — GC/CHLAMYDIA PROBE AMP
Chlamydia trachomatis, NAA: NEGATIVE
Neisseria Gonorrhoeae by PCR: NEGATIVE

## 2020-02-06 ENCOUNTER — Emergency Department (HOSPITAL_COMMUNITY)
Admission: EM | Admit: 2020-02-06 | Discharge: 2020-02-06 | Disposition: A | Discharge status: Home / Self Care | Payer: 59 | Attending: Emergency Medicine | Admitting: Emergency Medicine

## 2020-02-06 ENCOUNTER — Encounter (HOSPITAL_COMMUNITY): Payer: Self-pay

## 2020-02-06 ENCOUNTER — Other Ambulatory Visit: Payer: Self-pay

## 2020-02-06 DIAGNOSIS — O98511 Other viral diseases complicating pregnancy, first trimester: Secondary | ICD-10-CM | POA: Insufficient documentation

## 2020-02-06 DIAGNOSIS — R1011 Right upper quadrant pain: Secondary | ICD-10-CM | POA: Insufficient documentation

## 2020-02-06 DIAGNOSIS — U071 COVID-19: Secondary | ICD-10-CM | POA: Diagnosis not present

## 2020-02-06 DIAGNOSIS — Z3A01 Less than 8 weeks gestation of pregnancy: Secondary | ICD-10-CM | POA: Insufficient documentation

## 2020-02-06 DIAGNOSIS — F17210 Nicotine dependence, cigarettes, uncomplicated: Secondary | ICD-10-CM | POA: Insufficient documentation

## 2020-02-06 DIAGNOSIS — R05 Cough: Secondary | ICD-10-CM | POA: Insufficient documentation

## 2020-02-06 DIAGNOSIS — R112 Nausea with vomiting, unspecified: Secondary | ICD-10-CM | POA: Diagnosis not present

## 2020-02-06 LAB — URINALYSIS, ROUTINE W REFLEX MICROSCOPIC
Bilirubin Urine: NEGATIVE
Glucose, UA: NEGATIVE mg/dL
Hgb urine dipstick: NEGATIVE
Ketones, ur: 5 mg/dL — AB
Leukocytes,Ua: NEGATIVE
Nitrite: NEGATIVE
Protein, ur: NEGATIVE mg/dL
Specific Gravity, Urine: 1.02 (ref 1.005–1.030)
pH: 6 (ref 5.0–8.0)

## 2020-02-06 LAB — LIPASE, BLOOD: Lipase: 35 U/L (ref 11–51)

## 2020-02-06 LAB — COMPREHENSIVE METABOLIC PANEL
ALT: 26 U/L (ref 0–44)
AST: 32 U/L (ref 15–41)
Albumin: 4.2 g/dL (ref 3.5–5.0)
Alkaline Phosphatase: 49 U/L (ref 38–126)
Anion gap: 9 (ref 5–15)
BUN: 7 mg/dL (ref 6–20)
CO2: 21 mmol/L — ABNORMAL LOW (ref 22–32)
Calcium: 8.8 mg/dL — ABNORMAL LOW (ref 8.9–10.3)
Chloride: 108 mmol/L (ref 98–111)
Creatinine, Ser: 0.63 mg/dL (ref 0.44–1.00)
GFR calc Af Amer: >60 mL/min (ref >60)
GFR calc non Af Amer: >60 mL/min (ref >60)
Glucose, Bld: 90 mg/dL (ref 70–99)
Potassium: 3.9 mmol/L (ref 3.5–5.1)
Sodium: 138 mmol/L (ref 135–145)
Total Bilirubin: 1.1 mg/dL (ref 0.3–1.2)
Total Protein: 7.4 g/dL (ref 6.5–8.1)

## 2020-02-06 LAB — CBC WITH DIFFERENTIAL/PLATELET
Abs Immature Granulocytes: 0.04 10*3/uL (ref 0.00–0.07)
Basophils Absolute: 0 10*3/uL (ref 0.0–0.1)
Basophils Relative: 0 %
Eosinophils Absolute: 0.1 10*3/uL (ref 0.0–0.5)
Eosinophils Relative: 1 %
HCT: 41 % (ref 36.0–46.0)
Hemoglobin: 13.9 g/dL (ref 12.0–15.0)
Immature Granulocytes: 0 %
Lymphocytes Relative: 25 %
Lymphs Abs: 2.7 10*3/uL (ref 0.7–4.0)
MCH: 31.1 pg (ref 26.0–34.0)
MCHC: 33.9 g/dL (ref 30.0–36.0)
MCV: 91.7 fL (ref 80.0–100.0)
Monocytes Absolute: 0.6 10*3/uL (ref 0.1–1.0)
Monocytes Relative: 6 %
Neutro Abs: 7.2 10*3/uL (ref 1.7–7.7)
Neutrophils Relative %: 68 %
Platelets: 208 10*3/uL (ref 150–400)
RBC: 4.47 MIL/uL (ref 3.87–5.11)
RDW: 12.8 % (ref 11.5–15.5)
WBC: 10.7 10*3/uL — ABNORMAL HIGH (ref 4.0–10.5)
nRBC: 0 % (ref 0.0–0.2)

## 2020-02-06 LAB — I-STAT BETA HCG BLOOD, ED (MC, WL, AP ONLY): I-stat hCG, quantitative: 334.8 m[IU]/mL — ABNORMAL HIGH (ref <5)

## 2020-02-06 LAB — POC URINE PREG, ED: Preg Test, Ur: POSITIVE — AB

## 2020-02-06 MED ORDER — SODIUM CHLORIDE 0.9 % IV BOLUS
1000.0000 mL | Freq: Once | INTRAVENOUS | Status: AC
  Administered 2020-02-06: 1000 mL via INTRAVENOUS

## 2020-02-06 NOTE — ED Notes (Signed)
Main lab to add on urine culture.

## 2020-02-06 NOTE — ED Notes (Signed)
Patient tolerating PO fluids 

## 2020-02-06 NOTE — ED Provider Notes (Signed)
MSE was initiated and I personally evaluated the patient and placed orders (if any) at  4:03 PM on February 06, 2020.  Patient is a 24 year old woman with a + COVID test result today w/ sx for the past 3 days. Patient states she also discovered 2 days ago that she was pregnant with OTC pregnancy test.   She endorses multiple episodes of vomiting NBNB emesis. Endorses current nausea. Has not been able to tolerate PO for two days.  She endorses abdominal pain only when she vomits. No change in BMs/urinary sx. LMP over 1 month ago--usually regular.   She is well appearing. Will obtain urine preg to confirm pregnancy + urinalysis d/t nausea and abd pain (denies dysuria/freq/urgency). Will obtain basic labs CMP/Lipase/CBC as well. Expect patient will be discharged.   No abdominal TTP, rebound or guarding. Will likely be discharged after IV hydration and antiemetics pending pregnancy test results.  Doubt appendicitis, diverticulitis, colitis. Doubt ectopic pregnancy--vitals WNL and no abdmoinal TTP. Likely sx related to covid and dehydration.  The patient appears stable so that the remainder of the MSE may be completed by another provider.   Gailen Shelter, [place] 02/06/20 2103    Gwyneth Sprout, MD 02/06/20 [phone]

## 2020-02-06 NOTE — Discharge Instructions (Addendum)
For morning sickness and nausea and vomiting in pregnancy you may take Diclegis.  This is an expensive prescription.  The active ingredients are the same as taking unisom or a similar brand (Doxylamine succinate) and vitamin B6 (Pyridoxine hydrochloride).  Please make sure the active ingredient matches the same name as there are many types of unisom and generic medications.  You may take 12.5 of unisom (or generic) every 4-6 hours, if needed you can take up to 25mg .  Please do not take more than 75mg  in 24 hours.   For the vitamin B6 (pyridoxine) you may take 10mg  up to twice a day.   Please take Tylenol (acetaminophen) to relieve your pain.  You may take tylenol, up to 1,000 mg (two extra strength pills).  Do not take more than 3,000 mg tylenol in a 24 hour period.  Please check all medication labels as many medications such as pain and cold medications may contain tylenol. Please do not drink alcohol while taking this medication.

## 2020-02-06 NOTE — ED Provider Notes (Signed)
[facility] EMERGENCY DEPARTMENT Provider Note   CSN: [id number] Arrival date & time: 02/06/20  1420     History Chief Complaint  Patient presents with  . Nausea    Michele Fields is a 24 y.o. female [id number] presents today for evaluation of nausea and vomiting in the setting of Covid. She reports that she is on day 4 of Covid symptoms.  She reports that over the past few days she has been nauseous and vomiting.  She states that she has only been able to keep down one sip of water today and has otherwise vomited 3 times.  She denies any fevers.  She reports pain in her ribs/upper abdomen that started after she had been vomiting.  She denies any shortness of breath. She reports no sense of taste or smell along with mild nasal congestion and occasional cough. No diarrhea.   She was recently seen for possible cervicitis, however her GC testing came back negative and she did not fill or start taking the prescription for her doxycycline.    HPI     Past Medical History:  Diagnosis Date  . ADHD (attention deficit hyperactivity disorder)   . Bacterial vaginosis   . BRCA negative 10/2019   MyRisk neg  . Family history of breast cancer   . Increased risk of breast cancer 10/2019   IBIS=25.3%/riskscore=20.2%  . Yeast infection     Patient Active Problem List   Diagnosis Date Noted  . Thyroid nodule 10/19/2014  . BMI (body mass index), pediatric, > 99% for age 10/15/2013  . ADD (attention deficit disorder) 07/15/2013  . Presence of subdermal contraceptive device 07/15/2013  . Headache(784.0) 07/15/2013  . Allergic rhinitis 07/15/2013  . Menorrhagia 06/19/2013  . Screening examination for venereal disease 06/19/2013  . Abdominal pain, other specified site 06/19/2013    Past Surgical History:  Procedure Laterality Date  . CHOLECYSTECTOMY    . NO PAST SURGERIES       OB History    Gravida  2   Para  0   Term  0   Preterm  0   AB  1   Living   0     SAB  1   TAB  0   Ectopic  0   Multiple  0   Live Births  0           Family History  Problem Relation Age of Onset  . Thyroid disease Paternal Grandmother   . Ovarian cancer Paternal Grandmother 222 . Breast cancer Maternal Aunt 32  . Breast cancer Other 6105 . Cervical cancer Paternal Aunt 264   Social History   Tobacco Use  . Smoking status: Current Every Day Smoker    Packs/day: 0.50    Types: Cigarettes  . Smokeless tobacco: Never Used  Substance Use Topics  . Alcohol use: Yes  . Drug use: No    Home Medications Prior to Admission medications   Medication Sig Start Date End Date Taking? Authorizing Provider  doxycycline (VIBRAMYCIN) 100 MG capsule Take 1 capsule (100 mg total) by mouth 2 (two) times daily for 7 days. 01/31/20 02/07/20  FHinda Kehr MD  letrozole ([facility] 2.5 MG tablet Take 1 tablet (2.5 mg total) by mouth daily. 01/15/20   Schuman, CStefanie Libel MD  medroxyPROGESTERone (PROVERA) 10 MG tablet Take 1 tablet (10 mg total) by mouth daily for 10 days. Start after first 14 days of premarin 01/15/20 01/25/20  Schuman,  Stefanie Libel, MD    Allergies    Other  Review of Systems   Review of Systems  Constitutional: Positive for fatigue. Negative for chills and fever.  Respiratory: Positive for cough. Negative for shortness of breath.   Cardiovascular: Negative for chest pain and leg swelling.  Gastrointestinal: Positive for abdominal pain, nausea and vomiting. Negative for diarrhea.  Genitourinary: Negative for dysuria, frequency and urgency.  Musculoskeletal: Negative for back pain and neck pain.  Skin: Negative for color change, rash and wound.  Neurological: Negative for weakness and headaches.  All other systems reviewed and are negative.   Physical Exam Updated Vital Signs BP 111/61   Pulse 79   Temp 99.1 F (37.3 C) (Oral)   Resp 20   Ht '5\' 3"'$  (1.6 m)   Wt 81.6 kg   LMP 01/02/2020 (Exact Date)   SpO2 99%   BMI 31.89 kg/m    Physical Exam Vitals and nursing note reviewed.  Constitutional:      General: She is not in acute distress.    Appearance: She is well-developed.  HENT:     Head: Normocephalic and atraumatic.  Eyes:     Conjunctiva/sclera: Conjunctivae normal.  Cardiovascular:     Rate and Rhythm: Normal rate and regular rhythm.     Heart sounds: No murmur.  Pulmonary:     Effort: Pulmonary effort is normal. No respiratory distress.     Breath sounds: Normal breath sounds.  Abdominal:     General: Abdomen is flat.     Palpations: Abdomen is soft.     Tenderness: There is abdominal tenderness in the right upper quadrant, epigastric area and left upper quadrant. There is no guarding or rebound.     Hernia: No hernia is present.     Comments: No TTP in lower abdomen  Musculoskeletal:     Cervical back: Normal range of motion and neck supple.     Right lower leg: No edema.     Left lower leg: No edema.  Skin:    General: Skin is warm and dry.  Neurological:     General: No focal deficit present.     Mental Status: She is alert.     Cranial Nerves: No cranial nerve deficit.  Psychiatric:        Mood and Affect: Mood normal.        Behavior: Behavior normal.     ED Results / Procedures / Treatments   Labs (all labs ordered are listed, but only abnormal results are displayed) Labs Reviewed  URINALYSIS, ROUTINE W REFLEX MICROSCOPIC - Abnormal; Notable for the following components:      Result Value   APPearance CLOUDY (*)    Ketones, ur 5 (*)    All other components within normal limits  CBC WITH DIFFERENTIAL/PLATELET - Abnormal; Notable for the following components:   WBC 10.7 (*)    All other components within normal limits  COMPREHENSIVE METABOLIC PANEL - Abnormal; Notable for the following components:   CO2 21 (*)    Calcium 8.8 (*)    All other components within normal limits  POC URINE PREG, ED - Abnormal; Notable for the following components:   Preg Test, Ur POSITIVE (*)     All other components within normal limits  I-STAT BETA HCG BLOOD, ED ([facility], [facility], [facility] ONLY) - Abnormal; Notable for the following components:   I-stat hCG, quantitative 334.8 (*)    All other components within normal limits  URINE CULTURE  LIPASE, BLOOD    EKG None  Radiology No results found.  Procedures Procedures (including critical care time)  Medications Ordered in ED Medications  sodium chloride 0.9 % bolus 1,000 mL (0 mLs Intravenous Stopped 02/06/20 1733)  sodium chloride 0.9 % bolus 1,000 mL (0 mLs Intravenous Stopped 02/06/20 1925)    ED Course  I have reviewed the triage vital signs and the nursing notes.  Pertinent labs & imaging results that were available during my care of the patient were reviewed by me and considered in my medical decision making (see chart for details).  Clinical Course as of Feb 06 2304  Sat Feb 06, 2020  1745 Jan 9th Day 4 of symptoms   [EH]    Clinical Course User Index [EH] Ollen Gross   MDM Rules/Calculators/A&P                     Patient presents today for evaluation of nausea and vomiting.  Her pregnancy test 6 days ago was negative, however she reports she had a positive home pregnancy test and her pregnancy test here is positive.  I-STAT hCG in the 300s.  By dates of LMP she would be about 5 weeks 0 days, however I suspect this is not accurate given her low hCG and a negative pregnancy test 6 days ago. On exam she is generally well-appearing.  She does not have cough or shortness of breath.  She has been nauseous with decreased ability to tolerate p.o. over the past 2 days. On exam her abdomen is soft, she does not have any lower abdominal tenderness, do not suspect ectopic pregnancy. She was informed not to fill/take the doxycycline she had been previously prescribed which she stated she ended up testing negative and does not need. She does have mild upper abdominal pain, however this started after she had multiple  episodes of vomiting, suspect that this is primarily related to gastritis and her vomiting. Labs obtained and reviewed without significant hematologic or electrolyte derangements. She was given 2 L of IV fluids after which she felt better.  She was able to p.o. challenge without specific nausea meds/antiemetics which she passed.  Recommended close outpatient follow-up.  She is given instructions on Unisom and vitamin B6 to help with morning sickness at noon, along with instructions on eating small frequent meals.  She was offered prescription for antiemetics which she declined at this time.  Leatha Pietsch was evaluated in Emergency Department on 02/06/2020 for the symptoms described in the history of present illness. She was evaluated in the context of the global COVID-19 pandemic, which necessitated consideration that the patient might be at risk for infection with the SARS-CoV-2 virus that causes COVID-19. Institutional protocols and algorithms that pertain to the evaluation of patients at risk for COVID-19 are in a state of rapid change based on information released by regulatory bodies including the CDC and federal and state organizations. These policies and algorithms were followed during the patient's care in the ED.  Return precautions were discussed with patient who states their understanding.  At the time of discharge patient denied any unaddressed complaints or concerns.  Patient is agreeable for discharge home.  Note: Portions of this report may have been transcribed using voice recognition software. Every effort was made to ensure accuracy; however, inadvertent computerized transcription errors may be present  Final Clinical Impression(s) / ED Diagnoses Final diagnoses:  COVID-19 affecting pregnancy in first trimester    Rx / DC  Orders ED Discharge Orders    None       Ollen Gross 02/06/20 2306    Little, Wenda Overland, MD 02/07/20 1104

## 2020-02-06 NOTE — ED Triage Notes (Signed)
Patient states she recently found out she is pregnant and has covid and states she has been nauseous.

## 2020-02-07 LAB — URINE CULTURE: Culture: <10000 — AB

## 2020-02-10 ENCOUNTER — Encounter: Payer: Medicaid Other | Admitting: Advanced Practice Midwife

## 2020-02-24 ENCOUNTER — Ambulatory Visit: Payer: Medicaid Other | Admitting: Obstetrics and Gynecology

## 2020-02-25 ENCOUNTER — Ambulatory Visit: Payer: Medicaid Other | Admitting: Obstetrics and Gynecology

## 2020-02-28 NOTE — Progress Notes (Signed)
Chart reviewed by Pharmacist  Suzanne Walker PharmD, Contract Pharmacist at [facility]  

## 2020-02-29 ENCOUNTER — Ambulatory Visit (INDEPENDENT_AMBULATORY_CARE_PROVIDER_SITE_OTHER): Payer: Private Health Insurance - Indemnity | Admitting: Obstetrics & Gynecology

## 2020-02-29 ENCOUNTER — Other Ambulatory Visit (HOSPITAL_COMMUNITY)
Admission: RE | Admit: 2020-02-29 | Discharge: 2020-02-29 | Disposition: A | Discharge status: Home / Self Care | Payer: Medicaid Other | Source: Ambulatory Visit | Attending: Obstetrics & Gynecology | Admitting: Obstetrics & Gynecology

## 2020-02-29 ENCOUNTER — Encounter: Payer: Self-pay | Admitting: Obstetrics & Gynecology

## 2020-02-29 ENCOUNTER — Other Ambulatory Visit: Payer: Self-pay

## 2020-02-29 VITALS — BP 120/80 | Wt 185.0 lb

## 2020-02-29 DIAGNOSIS — N926 Irregular menstruation, unspecified: Secondary | ICD-10-CM

## 2020-02-29 DIAGNOSIS — Z369 Encounter for antenatal screening, unspecified: Secondary | ICD-10-CM

## 2020-02-29 DIAGNOSIS — Z3491 Encounter for supervision of normal pregnancy, unspecified, first trimester: Secondary | ICD-10-CM | POA: Diagnosis not present

## 2020-02-29 DIAGNOSIS — Z3A08 8 weeks gestation of pregnancy: Secondary | ICD-10-CM

## 2020-02-29 DIAGNOSIS — Z3490 Encounter for supervision of normal pregnancy, unspecified, unspecified trimester: Secondary | ICD-10-CM | POA: Insufficient documentation

## 2020-02-29 DIAGNOSIS — Z3481 Encounter for supervision of other normal pregnancy, first trimester: Secondary | ICD-10-CM

## 2020-02-29 MED ORDER — DOXYLAMINE-PYRIDOXINE 10-10 MG PO TBEC: 2.0000 | DELAYED_RELEASE_TABLET | Freq: Every day | ORAL | 5 refills | Status: DC

## 2020-02-29 MED ORDER — NICOTINE 7 MG/24HR TD PT24: 7.0000 mg | MEDICATED_PATCH | Freq: Every day | TRANSDERMAL | 1 refills | Status: DC

## 2020-02-29 MED ORDER — CONCEPT OB 130-92.4-1 MG PO CAPS: 1.0000 | ORAL_CAPSULE | Freq: Every day | ORAL | 11 refills | Status: DC

## 2020-02-29 MED ORDER — ONDANSETRON 4 MG PO TBDP: 4.0000 mg | ORAL_TABLET | Freq: Four times a day (QID) | ORAL | 0 refills | Status: DC | PRN

## 2020-02-29 NOTE — Patient Instructions (Signed)
First Trimester of Pregnancy The first trimester of pregnancy is from week 1 until the end of week 13 (months 1 through 3). A week after a sperm fertilizes an egg, the egg will implant on the wall of the uterus. This embryo will begin to develop into a baby. Genes from you and your partner will form the baby. The female genes will determine whether the baby will be a boy or a girl. At 6-8 weeks, the eyes and face will be formed, and the heartbeat can be seen on ultrasound. At the end of 12 weeks, all the baby's organs will be formed. Now that you are pregnant, you will want to do everything you can to have a healthy baby. Two of the most important things are to get good prenatal care and to follow your health care provider's instructions. Prenatal care is all the medical care you receive before the baby's birth. This care will help prevent, find, and treat any problems during the pregnancy and childbirth. Body changes during your first trimester Your body goes through many changes during pregnancy. The changes vary from woman to woman.  You may gain or lose a couple of pounds at first.  You may feel sick to your stomach (nauseous) and you may throw up (vomit). If the vomiting is uncontrollable, call your health care provider.  You may tire easily.  You may develop headaches that can be relieved by medicines. All medicines should be approved by your health care provider.  You may urinate more often. Painful urination may mean you have a bladder infection.  You may develop heartburn as a result of your pregnancy.  You may develop constipation because certain hormones are causing the muscles that push stool through your intestines to slow down.  You may develop hemorrhoids or swollen veins (varicose veins).  Your breasts may begin to grow larger and become tender. Your nipples may stick out more, and the tissue that surrounds them (areola) may become darker.  Your gums may bleed and may be  sensitive to brushing and flossing.  Dark spots or blotches (chloasma, mask of pregnancy) may develop on your face. This will likely fade after the baby is born.  Your menstrual periods will stop.  You may have a loss of appetite.  You may develop cravings for certain kinds of food.  You may have changes in your emotions from day to day, such as being excited to be pregnant or being concerned that something may go wrong with the pregnancy and baby.  You may have more vivid and strange dreams.  You may have changes in your hair. These can include thickening of your hair, rapid growth, and changes in texture. Some women also have hair loss during or after pregnancy, or hair that feels dry or thin. Your hair will most likely return to normal after your baby is born. What to expect at prenatal visits During a routine prenatal visit:  You will be weighed to make sure you and the baby are growing normally.  Your blood pressure will be taken.  Your abdomen will be measured to track your baby's growth.  The fetal heartbeat will be listened to between weeks 10 and 14 of your pregnancy.  Test results from any previous visits will be discussed. Your health care provider may ask you:  How you are feeling.  If you are feeling the baby move.  If you have had any abnormal symptoms, such as leaking fluid, bleeding, severe headaches, or abdominal   cramping.  If you are using any tobacco products, including cigarettes, chewing tobacco, and electronic cigarettes.  If you have any questions. Other tests that may be performed during your first trimester include:  Blood tests to find your blood type and to check for the presence of any previous infections. The tests will also be used to check for low iron levels (anemia) and protein on red blood cells (Rh antibodies). Depending on your risk factors, or if you previously had diabetes during pregnancy, you may have tests to check for high blood sugar  that affects pregnant women (gestational diabetes).  Urine tests to check for infections, diabetes, or protein in the urine.  An ultrasound to confirm the proper growth and development of the baby.  Fetal screens for spinal cord problems (spina bifida) and Down syndrome.  HIV (human immunodeficiency virus) testing. Routine prenatal testing includes screening for HIV, unless you choose not to have this test.  You may need other tests to make sure you and the baby are doing well. Follow these instructions at home: Medicines  Follow your health care provider's instructions regarding medicine use. Specific medicines may be either safe or unsafe to take during pregnancy.  Take a prenatal vitamin that contains at least 600 micrograms (mcg) of folic acid.  If you develop constipation, try taking a stool softener if your health care provider approves. Eating and drinking   Eat a balanced diet that includes fresh fruits and vegetables, whole grains, good sources of protein such as meat, eggs, or tofu, and low-fat dairy. Your health care provider will help you determine the amount of weight gain that is right for you.  Avoid raw meat and uncooked cheese. These carry germs that can cause birth defects in the baby.  Eating four or five small meals rather than three large meals a day may help relieve nausea and vomiting. If you start to feel nauseous, eating a few soda crackers can be helpful. Drinking liquids between meals, instead of during meals, also seems to help ease nausea and vomiting.  Limit foods that are high in fat and processed sugars, such as fried and sweet foods.  To prevent constipation: ? Eat foods that are high in fiber, such as fresh fruits and vegetables, whole grains, and beans. ? Drink enough fluid to keep your urine clear or pale yellow. Activity  Exercise only as directed by your health care provider. Most women can continue their usual exercise routine during  pregnancy. Try to exercise for 30 minutes at least 5 days a week. Exercising will help you: ? Control your weight. ? Stay in shape. ? Be prepared for labor and delivery.  Experiencing pain or cramping in the lower abdomen or lower back is a good sign that you should stop exercising. Check with your health care provider before continuing with normal exercises.  Try to avoid standing for long periods of time. Move your legs often if you must stand in one place for a long time.  Avoid heavy lifting.  Wear low-heeled shoes and practice good posture.  You may continue to have sex unless your health care provider tells you not to. Relieving pain and discomfort  Wear a good support bra to relieve breast tenderness.  Take warm sitz baths to soothe any pain or discomfort caused by hemorrhoids. Use hemorrhoid cream if your health care provider approves.  Rest with your legs elevated if you have leg cramps or low back pain.  If you develop varicose veins in   your legs, wear support hose. Elevate your feet for 15 minutes, 3-4 times a day. Limit salt in your diet. Prenatal care  Schedule your prenatal visits by the twelfth week of pregnancy. They are usually scheduled monthly at first, then more often in the last 2 months before delivery.  Write down your questions. Take them to your prenatal visits.  Keep all your prenatal visits as told by your health care provider. This is important. Safety  Wear your seat belt at all times when driving.  Make a list of emergency phone numbers, including numbers for family, friends, the hospital, and police and fire departments. General instructions  Ask your health care provider for a referral to a local prenatal education class. Begin classes no later than the beginning of month 6 of your pregnancy.  Ask for help if you have counseling or nutritional needs during pregnancy. Your health care provider can offer advice or refer you to specialists for help  with various needs.  Do not use hot tubs, steam rooms, or saunas.  Do not douche or use tampons or scented sanitary pads.  Do not cross your legs for long periods of time.  Avoid cat litter boxes and soil used by cats. These carry germs that can cause birth defects in the baby and possibly loss of the fetus by miscarriage or stillbirth.  Avoid all smoking, herbs, alcohol, and medicines not prescribed by your health care provider. Chemicals in these products affect the formation and growth of the baby.  Do not use any products that contain nicotine or tobacco, such as cigarettes and e-cigarettes. If you need help quitting, ask your health care provider. You may receive counseling support and other resources to help you quit.  Schedule a dentist appointment. At home, brush your teeth with a soft toothbrush and be gentle when you floss. Contact a health care provider if:  You have dizziness.  You have mild pelvic cramps, pelvic pressure, or nagging pain in the abdominal area.  You have persistent nausea, vomiting, or diarrhea.  You have a bad smelling vaginal discharge.  You have pain when you urinate.  You notice increased swelling in your face, hands, legs, or ankles.  You are exposed to fifth disease or chickenpox.  You are exposed to German measles (rubella) and have never had it. Get help right away if:  You have a fever.  You are leaking fluid from your vagina.  You have spotting or bleeding from your vagina.  You have severe abdominal cramping or pain.  You have rapid weight gain or loss.  You vomit blood or material that looks like coffee grounds.  You develop a severe headache.  You have shortness of breath.  You have any kind of trauma, such as from a fall or a car accident. Summary  The first trimester of pregnancy is from week 1 until the end of week 13 (months 1 through 3).  Your body goes through many changes during pregnancy. The changes vary from  woman to woman.  You will have routine prenatal visits. During those visits, your health care provider will examine you, discuss any test results you may have, and talk with you about how you are feeling. This information is not intended to replace advice given to you by your health care provider. Make sure you discuss any questions you have with your health care provider. Document Revised: 11/22/2017 Document Reviewed: 11/21/2016 Elsevier Patient Education  2020 [employer]  

## 2020-02-29 NOTE — Progress Notes (Signed)
02/29/2020   Chief Complaint: Missed period  History of Present Illness: Michele Fields is a 24 y.o. G2P0010 85w2dbased on Patient's last menstrual period was 01/02/2020 (exact date). with an Estimated Date of Delivery: 10/08/20, with the above CC.   Her periods were: irregular periods w PCOS diagnosed She was using no method when she conceived. She had been trying for one year to get pregnant; she conceived after her Jan period.  She has since separated from her BF due to infidelity.  She has Positive signs or symptoms of nausea/vomiting of pregnancy. She has Negative signs or symptoms of miscarriage or preterm labor She identifies Negative Zika risk factors for her and her partner On any different medications around the time she conceived/early pregnancy: No  History of varicella: Yes   ROS: A 12-point review of systems was performed and negative, except as stated in the above HPI.  OBGYN History: As per HPI. OB History  Gravida Para Term Preterm AB Living  2 0 0 0 1 0  SAB TAB Ectopic Multiple Live Births  1 0 0 0 0    # Outcome Date GA Lbr Len/2nd Weight Sex Delivery Anes PTL Lv  2 Current           1 SAB 01/2019            Any issues with any prior pregnancies: not applicable Any prior children are healthy, doing well, without any problems or issues: no History of pap smears: Yes. Last pap smear 10/2019. Abnormal: no  History of STIs: No   Past Medical History: Past Medical History:  Diagnosis Date  . ADHD (attention deficit hyperactivity disorder)   . Bacterial vaginosis   . BRCA negative 10/2019   MyRisk neg  . Family history of breast cancer   . Increased risk of breast cancer 10/2019   IBIS=25.3%/riskscore=20.2%  . Yeast infection     Past Surgical History: Past Surgical History:  Procedure Laterality Date  . CHOLECYSTECTOMY    . NO PAST SURGERIES      Family History:  Family History  Problem Relation Age of Onset  . Thyroid disease Paternal Grandmother     . Ovarian cancer Paternal Grandmother 272 . Breast cancer Maternal Aunt 32  . Breast cancer Other 641 . Cervical cancer Paternal Aunt 254  She denies any female cancers, bleeding or blood clotting disorders.  She denies any history of mental retardation, birth defects or genetic disorders in her or the FOB's history  Social History:  Social History   Socioeconomic History  . Marital status: Single    Spouse name: Not on file  . Number of children: Not on file  . Years of education: Not on file  . Highest education level: Not on file  Occupational History  . Not on file  Tobacco Use  . Smoking status: Current Every Day Smoker    Packs/day: 0.50    Types: Cigarettes  . Smokeless tobacco: Never Used  Substance and Sexual Activity  . Alcohol use: Yes  . Drug use: No  . Sexual activity: Yes    Birth control/protection: None  Other Topics Concern  . Not on file  Social History Narrative  . Not on file   Social Determinants of Health   Financial Resource Strain:   . Difficulty of Paying Living Expenses: Not on file  Food Insecurity:   . Worried About RCharity fundraiserin the Last Year: Not on file  .  Ran Out of Food in the Last Year: Not on file  Transportation Needs:   . Lack of Transportation (Medical): Not on file  . Lack of Transportation (Non-Medical): Not on file  Physical Activity:   . Days of Exercise per Week: Not on file  . Minutes of Exercise per Session: Not on file  Stress:   . Feeling of Stress : Not on file  Social Connections:   . Frequency of Communication with Friends and Family: Not on file  . Frequency of Social Gatherings with Friends and Family: Not on file  . Attends Religious Services: Not on file  . Active Member of Clubs or Organizations: Not on file  . Attends Archivist Meetings: Not on file  . Marital Status: Not on file  Intimate Partner Violence:   . Fear of Current or Ex-Partner: Not on file  . Emotionally Abused: Not on  file  . Physically Abused: Not on file  . Sexually Abused: Not on file   Any pets in the household: no  Allergy: Allergies  Allergen Reactions  . Other Other (See Comments)    Seasonal Allergies    Current Outpatient Medications:  Current Outpatient Medications:  .  Doxylamine-Pyridoxine (DICLEGIS) 10-10 MG TBEC, Take 2 tablets by mouth at bedtime. If symptoms persist, add one tablet in the morning and one in the afternoon, Disp: 100 tablet, Rfl: 5 .  nicotine (NICODERM CQ - DOSED IN MG/24 HR) 7 mg/24hr patch, Place 1 patch (7 mg total) onto the skin daily., Disp: 21 patch, Rfl: 1 .  ondansetron (ZOFRAN ODT) 4 MG disintegrating tablet, Take 1 tablet (4 mg total) by mouth every 6 (six) hours as needed for nausea., Disp: 20 tablet, Rfl: 0 .  Prenat w/o A Vit-FeFum-FePo-FA (CONCEPT OB) 130-92.4-1 MG CAPS, Take 1 capsule by mouth daily., Disp: 30 capsule, Rfl: 11   Physical Exam:   BP 120/80   Wt 185 lb (83.9 kg)   LMP 01/02/2020 (Exact Date)   BMI 32.77 kg/m  Body mass index is 32.77 kg/m. Constitutional: Well nourished, well developed female in no acute distress.  Neck:  Supple, normal appearance, and no thyromegaly  Cardiovascular: S1, S2 normal, no murmur, rub or gallop, regular rate and rhythm Respiratory:  Clear to auscultation bilateral. Normal respiratory effort Abdomen: positive bowel sounds and no masses, hernias; diffusely non tender to palpation, non distended Breasts: breasts appear normal, no suspicious masses, no skin or nipple changes or axillary nodes. Neuro/Psych:  Normal mood and affect.  Skin:  Warm and dry.  Lymphatic:  No inguinal lymphadenopathy.   Pelvic exam: is not limited by body habitus EGBUS: within normal limits, Vagina: within normal limits and with no blood in the vault, Cervix: normal appearing cervix without discharge or lesions, closed/long/high, Uterus:  enlarged: 8 weeks, and Adnexa:  normal adnexa  Assessment: Michele Fields is a 24 y.o.  G2P0010 72w2dbased on Patient's last menstrual period was 01/02/2020 (exact date). with an Estimated Date of Delivery: 10/08/20,  for prenatal care.  Plan:  1) Avoid alcoholic beverages. 2) Patient encouraged not to smoke.  3) Discontinue the use of all non-medicinal drugs and chemicals.  4) Take prenatal vitamins daily.  5) Seatbelt use advised 6) Nutrition, food safety (fish, cheese advisories, and high nitrite foods) and exercise discussed. 7[facility]  8) Patient is asked about travel to areas at risk for the ZJacksonvirus, and counseled to avoid travel and exposure to  mosquitoes or sexual partners who may have themselves been exposed to the virus. Testing is discussed, and will be ordered as appropriate.  9) Childbirth classes at [facility] advised 10) Genetic Screening, such as with 1st Trimester Screening, cell free fetal DNA, AFP testing, and Ultrasound, as well as with amniocentesis and CVS as appropriate, is discussed with patient. She plans to consider genetic testing this pregnancy. 11) [place] soon due to PCOS and irreg periods 12) Nicotine patches daily for two weeks to try to come off of cigarettes and vaping 13) Nausea meds discussed and eRx  Problem list reviewed and updated.  Barnett Applebaum, MD, [facility], [facility] 02/29/2020  10:51 AM

## 2020-03-01 LAB — GC/CHLAMYDIA PROBE AMP (~~LOC~~) NOT AT ARMC
Chlamydia: NEGATIVE
Comment: NEGATIVE
Comment: NORMAL
Neisseria Gonorrhea: NEGATIVE

## 2020-03-01 LAB — RPR+RH+ABO+RUB AB+AB SCR+CB...
Antibody Screen: NEGATIVE
HIV Screen 4th Generation wRfx: NONREACTIVE
Hematocrit: 39.6 % (ref 34.0–46.6)
Hemoglobin: 13.2 g/dL (ref 11.1–15.9)
Hepatitis B Surface Ag: NEGATIVE
MCH: 31.2 pg (ref 26.6–33.0)
MCHC: 33.3 g/dL (ref 31.5–35.7)
MCV: 94 fL (ref 79–97)
Platelets: 253 10*3/uL (ref 150–450)
RBC: 4.23 x10E6/uL (ref 3.77–5.28)
RDW: 12.4 % (ref 11.7–15.4)
RPR Ser Ql: NONREACTIVE
Rh Factor: POSITIVE
Rubella Antibodies, IGG: 4.32 index (ref >0.99)
Varicella zoster IgG: 376 index (ref >165)
WBC: 14.7 10*3/uL — ABNORMAL HIGH (ref 3.4–10.8)

## 2020-03-02 LAB — URINE CULTURE

## 2020-03-03 ENCOUNTER — Other Ambulatory Visit: Payer: Self-pay

## 2020-03-03 ENCOUNTER — Encounter: Payer: Self-pay | Admitting: Advanced Practice Midwife

## 2020-03-03 ENCOUNTER — Other Ambulatory Visit: Payer: Self-pay | Admitting: Obstetrics & Gynecology

## 2020-03-03 ENCOUNTER — Ambulatory Visit (INDEPENDENT_AMBULATORY_CARE_PROVIDER_SITE_OTHER): Payer: Private Health Insurance - Indemnity | Admitting: Advanced Practice Midwife

## 2020-03-03 ENCOUNTER — Ambulatory Visit (INDEPENDENT_AMBULATORY_CARE_PROVIDER_SITE_OTHER): Payer: Private Health Insurance - Indemnity

## 2020-03-03 VITALS — BP 118/74 | Wt 183.0 lb

## 2020-03-03 DIAGNOSIS — N926 Irregular menstruation, unspecified: Secondary | ICD-10-CM

## 2020-03-03 DIAGNOSIS — Z3481 Encounter for supervision of other normal pregnancy, first trimester: Secondary | ICD-10-CM

## 2020-03-03 DIAGNOSIS — Z3A08 8 weeks gestation of pregnancy: Secondary | ICD-10-CM

## 2020-03-03 DIAGNOSIS — Z3689 Encounter for other specified antenatal screening: Secondary | ICD-10-CM

## 2020-03-03 NOTE — Progress Notes (Signed)
  Routine Prenatal Care Visit  Subjective  Michele Fields is a 24 y.o. G2P0010 at [redacted]w[redacted]d being seen today for ongoing prenatal care.  She is currently monitored for the following issues for this low-risk pregnancy and has Menorrhagia; Screening examination for venereal disease; Abdominal pain, other specified site; BMI (body mass index), pediatric, > 99% for age; ADD (attention deficit disorder); Presence of subdermal contraceptive device; Headache(784.0); Allergic rhinitis; Thyroid nodule; and Encounter for supervision of low-risk pregnancy, antepartum on their problem list.  ----------------------------------------------------------------------------------- Patient reports no complaints.    . Vag. Bleeding: None.   . Leaking Fluid denies.  ----------------------------------------------------------------------------------- The following portions of the patient's history were reviewed and updated as appropriate: allergies, current medications, past family history, past medical history, past social history, past surgical history and problem list. Problem list updated.  Objective  Blood pressure 118/74, weight 183 lb (83 kg), last menstrual period 01/02/2020. Pregravid weight 180 lb (81.6 kg) Total Weight Gain 3 lb (1.361 kg) Urinalysis: Urine Protein    Urine Glucose    Fetal Status: Fetal Heart Rate (bpm): 163          Dating scan: [redacted]w[redacted]d with EDD 10/21, no adjustment of EDD for 5 day difference  General:  Alert, oriented and cooperative. Patient is in no acute distress.  Skin: Skin is warm and dry. No rash noted.   Cardiovascular: Normal heart rate noted  Respiratory: Normal respiratory effort, no problems with respiration noted  Abdomen: Soft, gravid, appropriate for gestational age.       Pelvic:  Cervical exam deferred        Extremities: Normal range of motion.     Mental Status: Normal mood and affect. Normal behavior. Normal judgment and thought content.   Assessment   24 y.o. G2P0010  at [redacted]w[redacted]d by  10/08/2020, by Last Menstrual Period presenting for routine prenatal visit  Plan   pregnancy2  Problems (from 01/02/20 to present)    No problems associated with this episode.       Preterm labor symptoms and general obstetric precautions including but not limited to vaginal bleeding, contractions, leaking of fluid and fetal movement were reviewed in detail with the patient.   Return in about 3 weeks (around 03/24/2020) for rob and MaterniT 21.  Tresea Mall, CNM 03/03/2020 4:27 PM

## 2020-03-03 NOTE — Progress Notes (Signed)
Dating scan today. No vb. No lof.  °

## 2020-03-05 ENCOUNTER — Encounter (HOSPITAL_COMMUNITY): Payer: Self-pay | Admitting: Obstetrics and Gynecology

## 2020-03-05 ENCOUNTER — Other Ambulatory Visit: Payer: Self-pay

## 2020-03-05 ENCOUNTER — Inpatient Hospital Stay (HOSPITAL_COMMUNITY)
Admission: AD | Admit: 2020-03-05 | Discharge: 2020-03-05 | Disposition: A | Discharge status: Home / Self Care | Payer: Medicaid Other | Source: Ambulatory Visit | Attending: Obstetrics and Gynecology | Admitting: Obstetrics and Gynecology

## 2020-03-05 DIAGNOSIS — F17210 Nicotine dependence, cigarettes, uncomplicated: Secondary | ICD-10-CM | POA: Diagnosis not present

## 2020-03-05 DIAGNOSIS — Z3A09 9 weeks gestation of pregnancy: Secondary | ICD-10-CM | POA: Diagnosis not present

## 2020-03-05 DIAGNOSIS — R109 Unspecified abdominal pain: Secondary | ICD-10-CM | POA: Diagnosis not present

## 2020-03-05 DIAGNOSIS — O99331 Smoking (tobacco) complicating pregnancy, first trimester: Secondary | ICD-10-CM | POA: Diagnosis not present

## 2020-03-05 DIAGNOSIS — O219 Vomiting of pregnancy, unspecified: Secondary | ICD-10-CM

## 2020-03-05 DIAGNOSIS — O210 Mild hyperemesis gravidarum: Secondary | ICD-10-CM | POA: Insufficient documentation

## 2020-03-05 LAB — URINALYSIS, ROUTINE W REFLEX MICROSCOPIC
Bilirubin Urine: NEGATIVE
Glucose, UA: NEGATIVE mg/dL
Hgb urine dipstick: NEGATIVE
Ketones, ur: 80 mg/dL — AB
Leukocytes,Ua: NEGATIVE
Nitrite: NEGATIVE
Protein, ur: NEGATIVE mg/dL
Specific Gravity, Urine: 1.016 (ref 1.005–1.030)
pH: 7 (ref 5.0–8.0)

## 2020-03-05 MED ORDER — FAMOTIDINE IN NACL 20-0.9 MG/50ML-% IV SOLN
20.0000 mg | Freq: Once | INTRAVENOUS | Status: AC
  Administered 2020-03-05: 20 mg via INTRAVENOUS
  Filled 2020-03-05: qty 50

## 2020-03-05 MED ORDER — PROMETHAZINE HCL 25 MG/ML IJ SOLN
25.0000 mg | Freq: Once | INTRAMUSCULAR | Status: AC
  Administered 2020-03-05: 25 mg via INTRAVENOUS
  Filled 2020-03-05: qty 1

## 2020-03-05 MED ORDER — LACTATED RINGERS IV BOLUS
1000.0000 mL | Freq: Once | INTRAVENOUS | Status: AC
  Administered 2020-03-05: 1000 mL via INTRAVENOUS

## 2020-03-05 MED ORDER — PROMETHAZINE HCL 25 MG PO TABS: 25.0000 mg | ORAL_TABLET | Freq: Four times a day (QID) | ORAL | 0 refills | Status: DC | PRN

## 2020-03-05 NOTE — MAU Note (Signed)
Michele Fields is a 24 y.o. at [redacted]w[redacted]d here in MAU reporting: nausea/vomiting since about 0300, is not able to keep anything down. Emesis to many times to count today. Is on a nicotine patch and thinks she is going through withdrawal and the dose is not high enough. Also having abdominal pain, no bleeding or abnormal discharge.  Onset of complaint: today  Pain score: 10/10  Vitals:   03/05/20 1508  BP: 120/63  Pulse: 80  Resp: 18  Temp: 99 F (37.2 C)  SpO2: 100%     Lab orders placed from triage: UA

## 2020-03-05 NOTE — Discharge Instructions (Signed)

## 2020-03-05 NOTE — MAU Provider Note (Signed)
Chief Complaint: Abdominal Pain, Nausea, and Emesis   First Provider Initiated Contact with Patient 03/05/20 1630     SUBJECTIVE HPI: Michele Fields is a 24 y.o. G2P0010 at 65w0dwho presents to Maternity Admissions reporting nausea/vomiting, and abdominal cramping. Has had some nausea/vomiting with the pregnancy that worsened last night. Normally vomits once in the morning & has been treating her symptoms with zofran. States she has vomited countless times since last night. Has not been able to keep down food or fluids. Last took zofran yesterday. Has had some lower abdominal cramping since this morning. Denies vaginal bleeding, fever/chills, or diarrhea.   Location: abdomen Quality: cramping Severity: 3/10 on pain scale Duration: <1 day Timing: intermittent Modifying factors: none Associated signs and symptoms: n/v  Past Medical History:  Diagnosis Date  . ADHD (attention deficit hyperactivity disorder)   . Bacterial vaginosis   . BRCA negative 10/2019   MyRisk neg  . Family history of breast cancer   . Increased risk of breast cancer 10/2019   IBIS=25.3%/riskscore=20.2%  . Yeast infection    OB History  Gravida Para Term Preterm AB Living  2 0 0 0 1 0  SAB TAB Ectopic Multiple Live Births  1 0 0 0 0    # Outcome Date GA Lbr Len/2nd Weight Sex Delivery Anes PTL Lv  2 Current           1 SAB 01/2019           Past Surgical History:  Procedure Laterality Date  . CHOLECYSTECTOMY    . NO PAST SURGERIES     Social History   Socioeconomic History  . Marital status: Single    Spouse name: Not on file  . Number of children: Not on file  . Years of education: Not on file  . Highest education level: Not on file  Occupational History  . Not on file  Tobacco Use  . Smoking status: Current Every Day Smoker    Packs/day: 0.50    Types: Cigarettes  . Smokeless tobacco: Never Used  Substance and Sexual Activity  . Alcohol use: Yes  . Drug use: No  . Sexual activity: Yes     Birth control/protection: None  Other Topics Concern  . Not on file  Social History Narrative  . Not on file   Social Determinants of Health   Financial Resource Strain:   . Difficulty of Paying Living Expenses:   Food Insecurity:   . Worried About RCharity fundraiserin the Last Year:   . RArboriculturistin the Last Year:   Transportation Needs:   . LFilm/video editor(Medical):   .Marland KitchenLack of Transportation (Non-Medical):   Physical Activity:   . Days of Exercise per Week:   . Minutes of Exercise per Session:   Stress:   . Feeling of Stress :   Social Connections:   . Frequency of Communication with Friends and Family:   . Frequency of Social Gatherings with Friends and Family:   . Attends Religious Services:   . Active Member of Clubs or Organizations:   . Attends CArchivistMeetings:   .Marland KitchenMarital Status:   Intimate Partner Violence:   . Fear of Current or Ex-Partner:   . Emotionally Abused:   .Marland KitchenPhysically Abused:   . Sexually Abused:    Family History  Problem Relation Age of Onset  . Thyroid disease Paternal Grandmother   . Ovarian cancer Paternal Grandmother 264 .  Breast cancer Maternal Aunt 32  . Breast cancer Other 20  . Cervical cancer Paternal Aunt 28   No current facility-administered medications on file prior to encounter.   Current Outpatient Medications on File Prior to Encounter  Medication Sig Dispense Refill  . Doxylamine-Pyridoxine (DICLEGIS) 10-10 MG TBEC Take 2 tablets by mouth at bedtime. If symptoms persist, add one tablet in the morning and one in the afternoon 100 tablet 5  . nicotine (NICODERM CQ - DOSED IN MG/24 HR) 7 mg/24hr patch Place 1 patch (7 mg total) onto the skin daily. 21 patch 1  . ondansetron (ZOFRAN-ODT) 4 MG disintegrating tablet TAKE 1 TABLET BY MOUTH EVERY 6 HOURS AS NEEDED FOR NAUSEA 20 tablet 0  . Prenat w/o A Vit-FeFum-FePo-FA (CONCEPT OB) 130-92.4-1 MG CAPS Take 1 capsule by mouth daily. 30 capsule 11    Allergies  Allergen Reactions  . Other Other (See Comments)    Seasonal Allergies    I have reviewed patient's Past Medical Hx, Surgical Hx, Family Hx, Social Hx, medications and allergies.   Review of Systems  Constitutional: Negative.   Gastrointestinal: Positive for abdominal pain, nausea and vomiting. Negative for constipation and diarrhea.  Genitourinary: Negative.     OBJECTIVE Patient Vitals for the past 24 hrs:  BP Temp Temp src Pulse Resp SpO2 Height Weight  03/05/20 1508 120/63 99 F (37.2 C) Oral 80 18 100 % -- --  03/05/20 1504 -- -- -- -- -- -- 5' 3" (1.6 m) 83 kg   Constitutional: Well-developed, well-nourished female in no acute distress.  Cardiovascular: normal rate & rhythm, no murmur Respiratory: normal rate and effort. Lung sounds clear throughout GI: Abd soft, non-tender, Pos BS x 4. No guarding or rebound tenderness MS: Extremities nontender, no edema, normal ROM Neurologic: Alert and oriented x 4.  GU: cervix closed  LAB RESULTS Results for orders placed or performed during the hospital encounter of 03/05/20 (from the past 24 hour(s))  Urinalysis, Routine w reflex microscopic     Status: Abnormal   Collection Time: 03/05/20  3:52 PM  Result Value Ref Range   Color, Urine YELLOW YELLOW   APPearance HAZY (A) CLEAR   Specific Gravity, Urine 1.016 1.005 - 1.030   pH 7.0 5.0 - 8.0   Glucose, UA NEGATIVE NEGATIVE mg/dL   Hgb urine dipstick NEGATIVE NEGATIVE   Bilirubin Urine NEGATIVE NEGATIVE   Ketones, ur 80 (A) NEGATIVE mg/dL   Protein, ur NEGATIVE NEGATIVE mg/dL   Nitrite NEGATIVE NEGATIVE   Leukocytes,Ua NEGATIVE NEGATIVE    IMAGING No results found.  MAU COURSE Orders Placed This Encounter  Procedures  . Urinalysis, Routine w reflex microscopic  . Discharge patient   Meds ordered this encounter  Medications  . lactated ringers bolus 1,000 mL  . promethazine (PHENERGAN) injection 25 mg  . famotidine (PEPCID) IVPB 20 mg premix  .  promethazine (PHENERGAN) 25 MG tablet    Sig: Take 1 tablet (25 mg total) by mouth every 6 (six) hours as needed for nausea or vomiting.    Dispense:  30 tablet    Refill:  0    Order Specific Question:   Supervising Provider    Answer:   Debbrah Alar [[id number]]    MDM Pt informed that the ultrasound is considered a limited OB ultrasound and is not intended to be a complete ultrasound exam.  Patient also informed that the ultrasound is not being completed with the intent of assessing for fetal or placental anomalies or  any pelvic abnormalities.  Explained that the purpose of today's ultrasound is to assess for  viability.  Patient acknowledges the purpose of the exam and the limitations of the study.  Live IUP w/FHR 167 bpm  U/a with 80 ketones. IV fluid bolus of LR given. IV phenergan 25 mg & pepcid 20 mg given. Pt not vomiting while in MAU. Will discharge home with rx phenergan.   ASSESSMENT 1. Nausea and vomiting during pregnancy prior to [redacted] weeks gestation   2. [redacted] weeks gestation of pregnancy     PLAN Discharge home in stable condition. Discussed reasons to return to MAU Rx phenergan F/u with OB/gyn in [place] as of 03/05/2020      Reactions   Other Other (See Comments)   Seasonal Allergies      Medication List    TAKE these medications   Concept OB 130-92.4-1 MG Caps Take 1 capsule by mouth daily.   Doxylamine-Pyridoxine 10-10 MG Tbec Commonly known as: Diclegis Take 2 tablets by mouth at bedtime. If symptoms persist, add one tablet in the morning and one in the afternoon   nicotine 7 mg/24hr patch Commonly known as: NICODERM CQ - dosed in mg/24 hr Place 1 patch (7 mg total) onto the skin daily.   ondansetron 4 MG disintegrating tablet Commonly known as: ZOFRAN-ODT TAKE 1 TABLET BY MOUTH EVERY 6 HOURS AS NEEDED FOR NAUSEA   promethazine 25 MG tablet Commonly known as: PHENERGAN Take 1 tablet (25 mg total) by mouth every 6 (six) hours as needed for  nausea or vomiting.        Jorje Guild, NP 03/05/2020  6:48 PM

## 2020-03-07 ENCOUNTER — Telehealth: Payer: Self-pay

## 2020-03-07 NOTE — Telephone Encounter (Signed)
Called pt to follow up on hospital nurse call over the weekend. Pt says she is feeling better. Will follow up with [place] as needed. Says sometime middle Michele Fields, will move her prenatal care to another facility.

## 2020-03-24 ENCOUNTER — Other Ambulatory Visit: Payer: Self-pay

## 2020-03-24 ENCOUNTER — Encounter: Payer: Self-pay | Admitting: Obstetrics and Gynecology

## 2020-03-24 ENCOUNTER — Ambulatory Visit (INDEPENDENT_AMBULATORY_CARE_PROVIDER_SITE_OTHER): Payer: Medicaid Other | Admitting: Obstetrics and Gynecology

## 2020-03-24 VITALS — BP 118/70 | Wt 186.0 lb

## 2020-03-24 DIAGNOSIS — Z1379 Encounter for other screening for genetic and chromosomal anomalies: Secondary | ICD-10-CM

## 2020-03-24 DIAGNOSIS — Z3491 Encounter for supervision of normal pregnancy, unspecified, first trimester: Secondary | ICD-10-CM

## 2020-03-24 DIAGNOSIS — Z3490 Encounter for supervision of normal pregnancy, unspecified, unspecified trimester: Secondary | ICD-10-CM

## 2020-03-24 DIAGNOSIS — Z3A11 11 weeks gestation of pregnancy: Secondary | ICD-10-CM

## 2020-03-24 NOTE — Progress Notes (Signed)
ROB/Maternit 21 No concerns Denies cramping/spotting

## 2020-03-24 NOTE — Patient Instructions (Signed)

## 2020-03-24 NOTE — Progress Notes (Signed)
    Routine Prenatal Care Visit  Subjective  Michele Fields is a 24 y.o. G2P0010 at [redacted]w[redacted]d being seen today for ongoing prenatal care.  She is currently monitored for the following issues for this low-risk pregnancy and has Menorrhagia; Screening examination for venereal disease; Abdominal pain, other specified site; BMI (body mass index), pediatric, > 99% for age; ADD (attention deficit disorder); Presence of subdermal contraceptive device; Headache(784.0); Allergic rhinitis; Thyroid nodule; and Encounter for supervision of low-risk pregnancy, antepartum on their problem list.  ----------------------------------------------------------------------------------- Patient reports no complaints.   Contractions: Not present. Vag. Bleeding: None.  Movement: Absent. Denies leaking of fluid.  ----------------------------------------------------------------------------------- The following portions of the patient's history were reviewed and updated as appropriate: allergies, current medications, past family history, past medical history, past social history, past surgical history and problem list. Problem list updated.   Objective  Blood pressure 118/70, weight 186 lb (84.4 kg), last menstrual period 01/02/2020. Pregravid weight 180 lb (81.6 kg) Total Weight Gain 6 lb (2.722 kg) Urinalysis:      Fetal Status: Fetal Heart Rate (bpm): 156   Movement: Absent     General:  Alert, oriented and cooperative. Patient is in no acute distress.  Skin: Skin is warm and dry. No rash noted.   Cardiovascular: Normal heart rate noted  Respiratory: Normal respiratory effort, no problems with respiration noted  Abdomen: Soft, gravid, appropriate for gestational age. Pain/Pressure: Absent     Pelvic:  Cervical exam deferred        Extremities: Normal range of motion.  Edema: None  Mental Status: Normal mood and affect. Normal behavior. Normal judgment and thought content.     Assessment   24 y.o. G2P0010 at [redacted]w[redacted]d  by  10/08/2020, by Last Menstrual Period presenting for routine prenatal visit  Plan   pregnancy2  Problems (from 01/02/20 to present)    Problem Noted Resolved   Encounter for supervision of low-risk pregnancy, antepartum 02/29/2020 by [name], MD No   Overview Addendum 03/24/2020  9:04 AM by [name], MD    Clinic [facility] Prenatal Labs  Dating  LMP = T1 [place] Blood type: A/Positive/-- (03/08 1054)   Genetic Screen NIPS: Antibody:Negative (03/08 1054)  Anatomic [place]  Rubella: 4.32 (03/08 1054)  Varicella: Immune  GTT   Third trimester:  RPR: Non Reactive (03/08 1054)   Rhogam  HBsAg: Negative (03/08 1054)   TDaP vaccine               Flu Shot: HIV: Non Reactive (03/08 1054)   Baby Food                                GBS:   Contraception  Pap: 10/2019  CBB  No   CS/VBAC N/A   Support Person Sister Leavy Cella             Maternit21 testing today- desires gender envelope  Gestational age appropriate obstetric precautions including but not limited to vaginal bleeding, contractions, leaking of fluid and fetal movement were reviewed in detail with the patient.    Return in about 4 weeks (around 04/21/2020) for ROB in person.  [name] MD [facility], [facility] 03/24/2020, 9:12 AM

## 2020-03-28 LAB — MATERNIT21 PLUS CORE+SCA
Fetal Fraction: 9
Monosomy X (Turner Syndrome): NOT DETECTED
Result (T21): NEGATIVE
Trisomy 13 (Patau syndrome): NEGATIVE
Trisomy 18 (Edwards syndrome): NEGATIVE
Trisomy 21 (Down syndrome): NEGATIVE
XXX (Triple X Syndrome): NOT DETECTED
XXY (Klinefelter Syndrome): NOT DETECTED
XYY (Jacobs Syndrome): NOT DETECTED

## 2020-04-15 ENCOUNTER — Other Ambulatory Visit (HOSPITAL_COMMUNITY): Payer: Self-pay | Admitting: Obstetrics and Gynecology

## 2020-04-15 DIAGNOSIS — Z363 Encounter for antenatal screening for malformations: Secondary | ICD-10-CM

## 2020-04-15 DIAGNOSIS — Z3A18 18 weeks gestation of pregnancy: Secondary | ICD-10-CM

## 2020-04-22 ENCOUNTER — Encounter: Payer: Medicaid Other | Admitting: Certified Nurse Midwife

## 2020-05-02 ENCOUNTER — Encounter (HOSPITAL_COMMUNITY): Payer: Self-pay | Admitting: Obstetrics and Gynecology

## 2020-05-02 ENCOUNTER — Other Ambulatory Visit: Payer: Self-pay

## 2020-05-02 ENCOUNTER — Inpatient Hospital Stay (HOSPITAL_COMMUNITY)
Admission: AD | Admit: 2020-05-02 | Discharge: 2020-05-02 | Disposition: A | Discharge status: Home / Self Care | Payer: Medicaid Other | Attending: Obstetrics and Gynecology | Admitting: Obstetrics and Gynecology

## 2020-05-02 DIAGNOSIS — Z87891 Personal history of nicotine dependence: Secondary | ICD-10-CM | POA: Insufficient documentation

## 2020-05-02 DIAGNOSIS — R109 Unspecified abdominal pain: Secondary | ICD-10-CM | POA: Diagnosis present

## 2020-05-02 DIAGNOSIS — Z3490 Encounter for supervision of normal pregnancy, unspecified, unspecified trimester: Secondary | ICD-10-CM

## 2020-05-02 DIAGNOSIS — R102 Pelvic and perineal pain: Secondary | ICD-10-CM | POA: Diagnosis not present

## 2020-05-02 DIAGNOSIS — O26892 Other specified pregnancy related conditions, second trimester: Secondary | ICD-10-CM

## 2020-05-02 DIAGNOSIS — O26899 Other specified pregnancy related conditions, unspecified trimester: Secondary | ICD-10-CM

## 2020-05-02 DIAGNOSIS — Z3A17 17 weeks gestation of pregnancy: Secondary | ICD-10-CM | POA: Insufficient documentation

## 2020-05-02 LAB — URINALYSIS, ROUTINE W REFLEX MICROSCOPIC
Bilirubin Urine: NEGATIVE
Glucose, UA: NEGATIVE mg/dL
Hgb urine dipstick: NEGATIVE
Ketones, ur: NEGATIVE mg/dL
Leukocytes,Ua: NEGATIVE
Nitrite: NEGATIVE
Protein, ur: 30 mg/dL — AB
Specific Gravity, Urine: 1.025 (ref 1.005–1.030)
pH: 6 (ref 5.0–8.0)

## 2020-05-02 LAB — WET PREP, GENITAL
Clue Cells Wet Prep HPF POC: NONE SEEN
Sperm: NONE SEEN
Trich, Wet Prep: NONE SEEN
Yeast Wet Prep HPF POC: NONE SEEN

## 2020-05-02 NOTE — Discharge Instructions (Signed)
Abdominal Pain During Pregnancy  Belly (abdominal) pain is common during pregnancy. There are many possible causes. Most of the time, it is not a serious problem. Other times, it can be a sign that something is wrong with the pregnancy. Always tell your doctor if you have belly pain. Follow these instructions at home:  Do not have sex or put anything in your vagina until your pain goes away completely.  Get plenty of rest until your pain gets better.  Drink enough fluid to keep your pee (urine) pale yellow.  Take over-the-counter and prescription medicines only as told by your doctor.  Keep all follow-up visits as told by your doctor. This is important. Contact a doctor if:  Your pain continues or gets worse after resting.  You have lower belly pain that: ? Comes and goes at regular times. ? Spreads to your back. ? Feels like menstrual cramps.  You have pain or burning when you pee (urinate). Get help right away if:  You have a fever or chills.  You have vaginal bleeding.  You are leaking fluid from your vagina.  You are passing tissue from your vagina.  You throw up (vomit) for more than 24 hours.  You have watery poop (diarrhea) for more than 24 hours.  Your baby is moving less than usual.  You feel very weak or faint.  You have shortness of breath.  You have very bad pain in your upper belly. Summary  Belly (abdominal) pain is common during pregnancy. There are many possible causes.  If you have belly pain during pregnancy, tell your doctor right away.  Keep all follow-up visits as told by your doctor. This is important. This information is not intended to replace advice given to you by your health care provider. Make sure you discuss any questions you have with your health care provider. Document Revised: 03/30/2019 Document Reviewed: 03/14/2017 Elsevier Patient Education  2020 [employer] Inc[employer]  
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
## 2020-05-03 LAB — GC/CHLAMYDIA PROBE AMP (~~LOC~~) NOT AT ARMC
Chlamydia: NEGATIVE
Comment: NEGATIVE
Comment: NORMAL
Neisseria Gonorrhea: NEGATIVE

## 2020-05-04 ENCOUNTER — Other Ambulatory Visit: Payer: Self-pay | Admitting: Obstetrics & Gynecology

## 2020-05-13 ENCOUNTER — Ambulatory Visit (HOSPITAL_COMMUNITY): Payer: Medicaid Other | Attending: Obstetrics and Gynecology

## 2020-05-13 ENCOUNTER — Other Ambulatory Visit: Payer: Self-pay

## 2020-05-13 DIAGNOSIS — Z3A18 18 weeks gestation of pregnancy: Secondary | ICD-10-CM | POA: Diagnosis present

## 2020-05-13 DIAGNOSIS — F17200 Nicotine dependence, unspecified, uncomplicated: Secondary | ICD-10-CM

## 2020-05-13 DIAGNOSIS — Z3490 Encounter for supervision of normal pregnancy, unspecified, unspecified trimester: Secondary | ICD-10-CM

## 2020-05-13 DIAGNOSIS — O99332 Smoking (tobacco) complicating pregnancy, second trimester: Secondary | ICD-10-CM

## 2020-05-13 DIAGNOSIS — Z363 Encounter for antenatal screening for malformations: Secondary | ICD-10-CM | POA: Insufficient documentation

## 2020-06-29 ENCOUNTER — Inpatient Hospital Stay (HOSPITAL_COMMUNITY)
Admission: AD | Admit: 2020-06-29 | Discharge: 2020-06-29 | Disposition: A | Discharge status: Home / Self Care | Payer: Medicaid Other | Attending: Obstetrics & Gynecology | Admitting: Obstetrics & Gynecology

## 2020-06-29 ENCOUNTER — Encounter (HOSPITAL_COMMUNITY): Payer: Self-pay | Admitting: Obstetrics & Gynecology

## 2020-06-29 ENCOUNTER — Other Ambulatory Visit: Payer: Self-pay

## 2020-06-29 ENCOUNTER — Inpatient Hospital Stay (HOSPITAL_COMMUNITY): Payer: Medicaid Other

## 2020-06-29 DIAGNOSIS — R10825 Periumbilic rebound abdominal tenderness: Secondary | ICD-10-CM

## 2020-06-29 DIAGNOSIS — Z3A25 25 weeks gestation of pregnancy: Secondary | ICD-10-CM | POA: Insufficient documentation

## 2020-06-29 DIAGNOSIS — N1330 Unspecified hydronephrosis: Secondary | ICD-10-CM | POA: Diagnosis not present

## 2020-06-29 DIAGNOSIS — O26899 Other specified pregnancy related conditions, unspecified trimester: Secondary | ICD-10-CM

## 2020-06-29 DIAGNOSIS — Z87891 Personal history of nicotine dependence: Secondary | ICD-10-CM | POA: Diagnosis not present

## 2020-06-29 DIAGNOSIS — O99352 Diseases of the nervous system complicating pregnancy, second trimester: Secondary | ICD-10-CM | POA: Diagnosis not present

## 2020-06-29 DIAGNOSIS — R1031 Right lower quadrant pain: Secondary | ICD-10-CM | POA: Diagnosis not present

## 2020-06-29 DIAGNOSIS — O26892 Other specified pregnancy related conditions, second trimester: Secondary | ICD-10-CM | POA: Diagnosis not present

## 2020-06-29 DIAGNOSIS — G43909 Migraine, unspecified, not intractable, without status migrainosus: Secondary | ICD-10-CM | POA: Diagnosis not present

## 2020-06-29 DIAGNOSIS — R519 Headache, unspecified: Secondary | ICD-10-CM

## 2020-06-29 DIAGNOSIS — Z3490 Encounter for supervision of normal pregnancy, unspecified, unspecified trimester: Secondary | ICD-10-CM

## 2020-06-29 LAB — CBC
HCT: 31.1 % — ABNORMAL LOW (ref 36.0–46.0)
Hemoglobin: 10.6 g/dL — ABNORMAL LOW (ref 12.0–15.0)
MCH: 31.5 pg (ref 26.0–34.0)
MCHC: 34.1 g/dL (ref 30.0–36.0)
MCV: 92.3 fL (ref 80.0–100.0)
Platelets: 199 10*3/uL (ref 150–400)
RBC: 3.37 MIL/uL — ABNORMAL LOW (ref 3.87–5.11)
RDW: 13.7 % (ref 11.5–15.5)
WBC: 13.7 10*3/uL — ABNORMAL HIGH (ref 4.0–10.5)
nRBC: 0 % (ref 0.0–0.2)

## 2020-06-29 LAB — COMPREHENSIVE METABOLIC PANEL
ALT: 15 U/L (ref 0–44)
AST: 13 U/L — ABNORMAL LOW (ref 15–41)
Albumin: 3.1 g/dL — ABNORMAL LOW (ref 3.5–5.0)
Alkaline Phosphatase: 46 U/L (ref 38–126)
Anion gap: 11 (ref 5–15)
BUN: 7 mg/dL (ref 6–20)
CO2: 20 mmol/L — ABNORMAL LOW (ref 22–32)
Calcium: 9.4 mg/dL (ref 8.9–10.3)
Chloride: 107 mmol/L (ref 98–111)
Creatinine, Ser: 0.53 mg/dL (ref 0.44–1.00)
GFR calc Af Amer: >60 mL/min (ref >60)
GFR calc non Af Amer: >60 mL/min (ref >60)
Glucose, Bld: 111 mg/dL — ABNORMAL HIGH (ref 70–99)
Potassium: 3.3 mmol/L — ABNORMAL LOW (ref 3.5–5.1)
Sodium: 138 mmol/L (ref 135–145)
Total Bilirubin: 0.4 mg/dL (ref 0.3–1.2)
Total Protein: 6.2 g/dL — ABNORMAL LOW (ref 6.5–8.1)

## 2020-06-29 LAB — URINALYSIS, ROUTINE W REFLEX MICROSCOPIC
Bilirubin Urine: NEGATIVE
Glucose, UA: NEGATIVE mg/dL
Hgb urine dipstick: NEGATIVE
Ketones, ur: NEGATIVE mg/dL
Leukocytes,Ua: NEGATIVE
Nitrite: NEGATIVE
Protein, ur: NEGATIVE mg/dL
Specific Gravity, Urine: 1.013 (ref 1.005–1.030)
pH: 7 (ref 5.0–8.0)

## 2020-06-29 MED ORDER — COMFORT FIT MATERNITY SUPP LG MISC: 1.0000 [IU] | Freq: Every day | 0 refills | Status: DC

## 2020-06-29 MED ORDER — HYDROMORPHONE HCL 1 MG/ML IJ SOLN
1.0000 mg | Freq: Once | INTRAMUSCULAR | Status: AC
  Administered 2020-06-29: 1 mg via INTRAVENOUS
  Filled 2020-06-29: qty 1

## 2020-06-29 MED ORDER — LACTATED RINGERS IV SOLN: Freq: Once | INTRAVENOUS | Status: DC

## 2020-06-29 MED ORDER — DIPHENHYDRAMINE HCL 50 MG/ML IJ SOLN
25.0000 mg | Freq: Once | INTRAMUSCULAR | Status: AC
  Administered 2020-06-29: 25 mg via INTRAVENOUS
  Filled 2020-06-29: qty 1

## 2020-06-29 MED ORDER — BUTALBITAL-APAP-CAFFEINE 50-325-40 MG PO TABS
2.0000 | ORAL_TABLET | Freq: Once | ORAL | Status: AC
  Administered 2020-06-29: 2 via ORAL
  Filled 2020-06-29: qty 2

## 2020-06-29 MED ORDER — LACTATED RINGERS IV SOLN: INTRAVENOUS | Status: DC

## 2020-06-29 MED ORDER — HYDROMORPHONE HCL 1 MG/ML IJ SOLN
0.5000 mg | Freq: Once | INTRAMUSCULAR | Status: AC
  Administered 2020-06-29: 0.5 mg via INTRAVENOUS
  Filled 2020-06-29: qty 1

## 2020-06-29 MED ORDER — LACTATED RINGERS IV BOLUS
1000.0000 mL | Freq: Once | INTRAVENOUS | Status: AC
  Administered 2020-06-29: 1000 mL via INTRAVENOUS

## 2020-06-29 MED ORDER — CYCLOBENZAPRINE HCL 5 MG PO TABS
10.0000 mg | ORAL_TABLET | Freq: Once | ORAL | Status: AC
  Administered 2020-06-29: 10 mg via ORAL
  Filled 2020-06-29: qty 2

## 2020-06-29 MED ORDER — HYOSCYAMINE SULFATE 0.125 MG SL SUBL
0.1250 mg | SUBLINGUAL_TABLET | Freq: Once | SUBLINGUAL | Status: AC
  Administered 2020-06-29: 0.125 mg via SUBLINGUAL
  Filled 2020-06-29: qty 1

## 2020-06-29 MED ORDER — SIMETHICONE 80 MG PO CHEW
80.0000 mg | CHEWABLE_TABLET | Freq: Four times a day (QID) | ORAL | Status: DC | PRN
  Administered 2020-06-29: 80 mg via ORAL
  Filled 2020-06-29: qty 1

## 2020-06-29 MED ORDER — METOCLOPRAMIDE HCL 5 MG/ML IJ SOLN
5.0000 mg | Freq: Once | INTRAMUSCULAR | Status: AC
  Administered 2020-06-29: 5 mg via INTRAVENOUS
  Filled 2020-06-29: qty 2

## 2020-06-29 MED ORDER — DEXAMETHASONE SODIUM PHOSPHATE 10 MG/ML IJ SOLN
10.0000 mg | Freq: Once | INTRAMUSCULAR | Status: AC
  Administered 2020-06-29: 10 mg via INTRAVENOUS
  Filled 2020-06-29: qty 1

## 2020-06-29 NOTE — MAU Provider Note (Addendum)
Chief Complaint:  Abdominal Pain   First Provider Initiated Contact with Patient 06/29/20 0126     HPI: Michele Fields is a 24 y.o. G2P0010 at 54w4dho presents to maternity admissions reporting right sided abdominal pain, just to right of umbilicus, not quite flank.  Has been there off and on for weeks but more constant for past few days.  Also has a migraine, not common for her, but persistent for that past 4 days. No visual changes except some blurriness. She reports good fetal movement, denies LOF, vaginal bleeding, vaginal itching/burning, urinary symptoms, dizziness, n/v, diarrhea, constipation or fever/chills.   Abdominal Pain This is a new problem. The current episode started 1 to 4 weeks ago. The onset quality is gradual. The problem occurs intermittently. The problem has been unchanged. The quality of the pain is cramping and dull. The abdominal pain does not radiate. Associated symptoms include headaches. Pertinent negatives include no anorexia, constipation, diarrhea, dysuria, fever, frequency, myalgias, nausea or vomiting. The pain is aggravated by palpation. The pain is relieved by nothing. She has tried nothing for the symptoms.  Headache  This is a new problem. The current episode started in the past 7 days. The problem occurs constantly. The problem has been unchanged. Associated symptoms include abdominal pain, blurred vision and photophobia. Pertinent negatives include no anorexia, coughing, dizziness, fever, muscle aches, nausea, numbness, sore throat, vomiting or weakness. The symptoms are aggravated by bright light. She has tried nothing for the symptoms.   RN Note: Michele Fields a 24y.o. at 264w4dere in MAU reporting: sharp right middle abdominal pain that has been occurring for "3-4 weeks that went away" but began again on 06/27/20. Pt reports the pain has been constant. +FM. No VB or LOF. Pt is also reporting a migraine that has lasted for four consecutive days with no  relief. Pain score: 8/10 for abdominal pain and 10/10 for migraine  Past Medical History: Past Medical History:  Diagnosis Date  . ADHD (attention deficit hyperactivity disorder)   . Bacterial vaginosis   . BRCA negative 10/2019   MyRisk neg  . Family history of breast cancer   . Increased risk of breast cancer 10/2019   IBIS=25.3%/riskscore=20.2%  . Yeast infection     Past obstetric history: OB History  Gravida Para Term Preterm AB Living  2 0 0 0 1 0  SAB TAB Ectopic Multiple Live Births  1 0 0 0 0    # Outcome Date GA Lbr Len/2nd Weight Sex Delivery Anes PTL Lv  2 Current           1 SAB 01/2019            Past Surgical History: Past Surgical History:  Procedure Laterality Date  . CHOLECYSTECTOMY    . NO PAST SURGERIES      Family History: Family History  Problem Relation Age of Onset  . Thyroid disease Paternal Grandmother   . Ovarian cancer Paternal Grandmother 243. Breast cancer Maternal Aunt 32  . Breast cancer Other 6235. Cervical cancer Paternal Aunt 2836  Social History: Social History   Tobacco Use  . Smoking status: Former Smoker    Packs/day: 0.50    Types: Cigarettes    Quit date: 03/24/2020    Years since quitting: 0.2  . Smokeless tobacco: Never Used  Vaping Use  . Vaping Use: Former  Substance Use Topics  . Alcohol use: Not Currently  . Drug use: No  Allergies:  Allergies  Allergen Reactions  . Other Other (See Comments)    Seasonal Allergies    Meds:  Medications Prior to Admission  Medication Sig Dispense Refill Last Dose  . Prenat w/o A Vit-FeFum-FePo-FA (CONCEPT OB) 130-92.4-1 MG CAPS Take 1 capsule by mouth daily. 30 capsule 11 06/29/2020 at Unknown time    I have reviewed patient's Past Medical Hx, Surgical Hx, Family Hx, Social Hx, medications and allergies.   ROS:  Review of Systems  Constitutional: Negative for fever.  HENT: Negative for sore throat.   Eyes: Positive for blurred vision and photophobia.   Respiratory: Negative for cough.   Gastrointestinal: Positive for abdominal pain. Negative for anorexia, constipation, diarrhea, nausea and vomiting.  Genitourinary: Negative for dysuria and frequency.  Musculoskeletal: Negative for myalgias.  Neurological: Positive for headaches. Negative for dizziness, weakness and numbness.   Other systems negative  Physical Exam   Patient Vitals for the past 24 hrs:  Weight  06/29/20 0100 79.5 kg   Constitutional: Well-developed, well-nourished female in no acute distress.  Cardiovascular: normal rate and rhythm Respiratory: normal effort, clear to auscultation bilaterally GI: Abd soft, non-tender, gravid appropriate for gestational age.   No rebound or guarding. MS: Extremities nontender, no edema, normal ROM Neurologic: Alert and oriented x 4.  GU: Neg CVAT.  PELVIC EXAM: deferred  FHT:  Baseline 145 , moderate variability, accelerations present, no decelerations Contractions: Rare   Labs: Results for orders placed or performed during the hospital encounter of 06/29/20 (from the past 24 hour(s))  Urinalysis, Routine w reflex microscopic     Status: Abnormal   Collection Time: 06/29/20  1:02 AM  Result Value Ref Range   Color, Urine YELLOW YELLOW   APPearance CLOUDY (A) CLEAR   Specific Gravity, Urine 1.013 1.005 - 1.030   pH 7.0 5.0 - 8.0   Glucose, UA NEGATIVE NEGATIVE mg/dL   Hgb urine dipstick NEGATIVE NEGATIVE   Bilirubin Urine NEGATIVE NEGATIVE   Ketones, ur NEGATIVE NEGATIVE mg/dL   Protein, ur NEGATIVE NEGATIVE mg/dL   Nitrite NEGATIVE NEGATIVE   Leukocytes,Ua NEGATIVE NEGATIVE  CBC     Status: Abnormal   Collection Time: 06/29/20  1:48 AM  Result Value Ref Range   WBC 13.7 (H) 4.0 - 10.5 K/uL   RBC 3.37 (L) 3.87 - 5.11 MIL/uL   Hemoglobin 10.6 (L) 12.0 - 15.0 g/dL   HCT 31.1 (L) 36 - 46 %   MCV 92.3 80.0 - 100.0 fL   MCH 31.5 26.0 - 34.0 pg   MCHC 34.1 30.0 - 36.0 g/dL   RDW 13.7 11.5 - 15.5 %   Platelets 199 150  - 400 K/uL   nRBC 0.0 0.0 - 0.2 %  Comprehensive metabolic panel     Status: Abnormal   Collection Time: 06/29/20  1:48 AM  Result Value Ref Range   Sodium 138 135 - 145 mmol/L   Potassium 3.3 (L) 3.5 - 5.1 mmol/L   Chloride 107 98 - 111 mmol/L   CO2 20 (L) 22 - 32 mmol/L   Glucose, Bld 111 (H) 70 - 99 mg/dL   BUN 7 6 - 20 mg/dL   Creatinine, Ser 0.53 0.44 - 1.00 mg/dL   Calcium 9.4 8.9 - 10.3 mg/dL   Total Protein 6.2 (L) 6.5 - 8.1 g/dL   Albumin 3.1 (L) 3.5 - 5.0 g/dL   AST 13 (L) 15 - 41 U/L   ALT 15 0 - 44 U/L   Alkaline Phosphatase 46  38 - 126 U/L   Total Bilirubin 0.4 0.3 - 1.2 mg/dL   GFR calc non Af Amer >60 >60 mL/min   GFR calc Af Amer >60 >60 mL/min   Anion gap 11 5 - 15    A/Positive/-- (03/08 1054)  Imaging:  MRI pending  MAU Course/MDM: I have ordered labs and reviewed results. There is mild leukocytosis.  This combined with rebound tenderness, leads to the need to evaluate for appendicitis via MRI NST reviewed, reassuring for gestational age Consult Dr Ilda Basset with presentation, exam findings and test results.  Treatments in MAU included Headache cocktail (did not help headache), gave Dilaudid which relieved headache and most of her abdominal pain.  Also gave Levsin, Mylicon and Reglan.    Assessment: Single IUP at 79w4dRight middle abdominal pain with rebound Mild leukocytosis Migraine headache  Plan: Awaiting MRI results Transferred care to oncoming provider   MHansel FeinsteinCNM, MSN Certified Nurse-Midwife 06/29/2020 1:26 AM   Care Assumed from [place] MSN at 0815 hours  Patient reports that her headache is a 6/10 and abdominal pain is 5/10 after coming back from MRI.  0.5 mg Dilaudid given.  MRI Results pending.  RECHECK 0925:  MR PELVIS WO CONTRAST  Result Date: 06/29/2020 CLINICAL DATA:  Quadrant abdominal pain in pregnancy EXAM: MRI ABDOMEN AND PELVIS WITHOUT CONTRAST TECHNIQUE: Multiplanar multisequence MR imaging of the  abdomen and pelvis was performed. No intravenous contrast was administered. COMPARISON:  None. FINDINGS: COMBINED FINDINGS FOR BOTH MR ABDOMEN AND PELVIS Lower chest: No acute findings. Hepatobiliary: No mass or other parenchymal abnormality identified.Status post cholecystectomy. Pancreas: No mass, inflammatory changes, or other parenchymal abnormality identified. Spleen:  Within normal limits in size and appearance. Adrenals/Urinary Tract: No masses identified. Mild right hydronephrosis, likely physiologic in the setting of pregnancy. Stomach/Bowel: Visualized portions within the abdomen are unremarkable. Normal appendix (e.g. Series 4, image 11). Vascular/Lymphatic: No pathologically enlarged lymph nodes identified. No abdominal aortic aneurysm demonstrated. Reproductive: Gravid uterus with advanced gestation. No obvious abnormality of the fetus on this non tailored examination. Other:  None. Musculoskeletal: No suspicious bone lesions identified. IMPRESSION: 1. No acute MR findings of the abdomen or pelvis to explain right lower quadrant abdominal pain in the setting of pregnancy. 2.  Normal appendix. 3. Mild right hydronephrosis, likely physiologic in the setting of pregnancy. 4. Gravid uterus with advanced gestation. No obvious abnormality of the fetus on this non tailored examination. Electronically Signed   By: AEddie CandleM.D.   On: 06/29/2020 08:48   MR ABDOMEN WO CONTRAST  Result Date: 06/29/2020 CLINICAL DATA:  Quadrant abdominal pain in pregnancy EXAM: MRI ABDOMEN AND PELVIS WITHOUT CONTRAST TECHNIQUE: Multiplanar multisequence MR imaging of the abdomen and pelvis was performed. No intravenous contrast was administered. COMPARISON:  None. FINDINGS: COMBINED FINDINGS FOR BOTH MR ABDOMEN AND PELVIS Lower chest: No acute findings. Hepatobiliary: No mass or other parenchymal abnormality identified.Status post cholecystectomy. Pancreas: No mass, inflammatory changes, or other parenchymal abnormality  identified. Spleen:  Within normal limits in size and appearance. Adrenals/Urinary Tract: No masses identified. Mild right hydronephrosis, likely physiologic in the setting of pregnancy. Stomach/Bowel: Visualized portions within the abdomen are unremarkable. Normal appendix (e.g. Series 4, image 11). Vascular/Lymphatic: No pathologically enlarged lymph nodes identified. No abdominal aortic aneurysm demonstrated. Reproductive: Gravid uterus with advanced gestation. No obvious abnormality of the fetus on this non tailored examination. Other:  None. Musculoskeletal: No suspicious bone lesions identified. IMPRESSION: 1. No acute MR findings of the abdomen or  pelvis to explain right lower quadrant abdominal pain in the setting of pregnancy. 2.  Normal appendix. 3. Mild right hydronephrosis, likely physiologic in the setting of pregnancy. 4. Gravid uterus with advanced gestation. No obvious abnormality of the fetus on this non tailored examination. Electronically Signed   By: Eddie Candle M.D.   On: 06/29/2020 08:48   Headache continues despite dilaudid. Will give Fioricet and 1 L LR bolus. She can have something to drink. No tightness to her neck muscles palpated. Abdominal pain is dull and achy, located mostly on the right side. No guarding or rebound. She is feeling her baby move.  RECHECK 1035: Headache 2-3/10 after Fioricet. Abdominal pain 5/10. Patient reports she hasn't eaten anything since 8:30 PM last night, will feed her and see if that improves her headache/abdominal pain or makes it worse. Will also give flexeril  RECHECK 1200: Mild improvement in headache and abdominal pain Wants to go home now.  PLAN:  -Appendicitis ruled out, gall bladder surgically absent; abdominal pain likely MSK in origin -HA improved with fioricet and HA cocktail -DC to home with strict return precautions -Pregnancy belt RXed -Follow up with OBGYN provider as scheduled  Merilyn Baba, DO OB Fellow, Faculty  Practice 06/29/2020 12:23 PM

## 2020-06-29 NOTE — MAU Note (Signed)
...  Michele Fields is a 24 y.o. at [redacted]w[redacted]d here in MAU reporting: sharp right middle abdominal pain that has been occurring for "3-4 weeks that went away" but began again on 06/27/20. Pt reports the pain has been constant. +FM. No VB or LOF. Pt is also reporting a migraine that has lasted for four consecutive days with no relief.  Pain score: 8/10 for abdominal pain and 10/10 for migraine/  FHT: 150 initial FHR

## 2020-06-29 NOTE — Discharge Instructions (Signed)
Abdominal Pain During Pregnancy  Belly (abdominal) pain is common during pregnancy. There are many possible causes. Most of the time, it is not a serious problem. Other times, it can be a sign that something is wrong with the pregnancy. Always tell your doctor if you have belly pain. Follow these instructions at home:  Do not have sex or put anything in your vagina until your pain goes away completely.  Get plenty of rest until your pain gets better.  Drink enough fluid to keep your pee (urine) pale yellow.  Take over-the-counter and prescription medicines only as told by your doctor.  Keep all follow-up visits as told by your doctor. This is important. Contact a doctor if:  Your pain continues or gets worse after resting.  You have lower belly pain that: ? Comes and goes at regular times. ? Spreads to your back. ? Feels like menstrual cramps.  You have pain or burning when you pee (urinate). Get help right away if:  You have a fever or chills.  You have vaginal bleeding.  You are leaking fluid from your vagina.  You are passing tissue from your vagina.  You throw up (vomit) for more than 24 hours.  You have watery poop (diarrhea) for more than 24 hours.  Your baby is moving less than usual.  You feel very weak or faint.  You have shortness of breath.  You have very bad pain in your upper belly. Summary  Belly (abdominal) pain is common during pregnancy. There are many possible causes.  If you have belly pain during pregnancy, tell your doctor right away.  Keep all follow-up visits as told by your doctor. This is important. This information is not intended to replace advice given to you by your health care provider. Make sure you discuss any questions you have with your health care provider. Document Revised: 03/30/2019 Document Reviewed: 03/14/2017 Elsevier Patient Education  2020 [employer]   PREGNANCY SUPPORT BELT: You are not alone, Seventy-five  percent of women have some sort of abdominal or back pain at some point in their pregnancy. Your baby is growing at a fast pace, which means that your whole body is rapidly trying to adjust to the changes. As your uterus grows, your back may start feeling a bit under stress and this can result in back or abdominal pain that can go from mild, and therefore bearable, to severe pains that will not allow you to sit or lay down comfortably, When it comes to dealing with pregnancy-related pains and cramps, some pregnant women usually prefer natural remedies, which the market is filled with nowadays. For example, wearing a pregnancy support belt can help ease and lessen your discomfort and pain. WHAT ARE THE BENEFITS OF WEARING A PREGNANCY SUPPORT BELT? A pregnancy support belt provides support to the lower portion of the belly taking some of the weight of the growing uterus and distributing to the other parts of your body. It is designed make you comfortable and gives you extra support. Over the years, the pregnancy apparel market has been studying the needs and wants of pregnant women and they have come up with the most comfortable pregnancy support belts that woman could ever ask for. In fact, you will no longer have to wear a stretched-out or bulky pregnancy belt that is visible underneath your clothes and makes you feel even more uncomfortable. Nowadays, a pregnancy support belt is made of comfortable and stretchy materials that will not irritate your skin but   will actually make you feel at ease and you will not even notice you are wearing it. They are easy to put on and adjust during the day and can be worn at night for additional support.  BENEFITS: . Relives Back pain . Relieves Abdominal Muscle and Leg Pain . Stabilizes the Pelvic Ring . Offers a Cushioned Abdominal Lift Pad . Relieves pressure on the Sciatic Nerve Within Minutes WHERE TO GET YOUR PREGNANCY BELT: [employer] [phone]  @[address] [place], [place] [address]   

## 2020-07-26 ENCOUNTER — Inpatient Hospital Stay (HOSPITAL_COMMUNITY)
Admission: AD | Admit: 2020-07-26 | Discharge: 2020-07-26 | Disposition: A | Discharge status: Home / Self Care | Payer: Medicaid Other | Attending: Obstetrics & Gynecology | Admitting: Obstetrics & Gynecology

## 2020-07-26 ENCOUNTER — Other Ambulatory Visit: Payer: Self-pay

## 2020-07-26 ENCOUNTER — Encounter (HOSPITAL_COMMUNITY): Payer: Self-pay | Admitting: Obstetrics & Gynecology

## 2020-07-26 DIAGNOSIS — O479 False labor, unspecified: Secondary | ICD-10-CM | POA: Diagnosis not present

## 2020-07-26 DIAGNOSIS — M545 Low back pain: Secondary | ICD-10-CM | POA: Insufficient documentation

## 2020-07-26 DIAGNOSIS — O26893 Other specified pregnancy related conditions, third trimester: Secondary | ICD-10-CM | POA: Insufficient documentation

## 2020-07-26 DIAGNOSIS — O4703 False labor before 37 completed weeks of gestation, third trimester: Secondary | ICD-10-CM | POA: Diagnosis not present

## 2020-07-26 DIAGNOSIS — Z3A29 29 weeks gestation of pregnancy: Secondary | ICD-10-CM | POA: Insufficient documentation

## 2020-07-26 DIAGNOSIS — F909 Attention-deficit hyperactivity disorder, unspecified type: Secondary | ICD-10-CM | POA: Insufficient documentation

## 2020-07-26 DIAGNOSIS — Z3689 Encounter for other specified antenatal screening: Secondary | ICD-10-CM

## 2020-07-26 DIAGNOSIS — M549 Dorsalgia, unspecified: Secondary | ICD-10-CM

## 2020-07-26 DIAGNOSIS — O99891 Other specified diseases and conditions complicating pregnancy: Secondary | ICD-10-CM

## 2020-07-26 DIAGNOSIS — O99343 Other mental disorders complicating pregnancy, third trimester: Secondary | ICD-10-CM | POA: Insufficient documentation

## 2020-07-26 DIAGNOSIS — Z3490 Encounter for supervision of normal pregnancy, unspecified, unspecified trimester: Secondary | ICD-10-CM

## 2020-07-26 DIAGNOSIS — Z87891 Personal history of nicotine dependence: Secondary | ICD-10-CM | POA: Insufficient documentation

## 2020-07-26 LAB — URINALYSIS, ROUTINE W REFLEX MICROSCOPIC
Bilirubin Urine: NEGATIVE
Glucose, UA: NEGATIVE mg/dL
Hgb urine dipstick: NEGATIVE
Ketones, ur: NEGATIVE mg/dL
Leukocytes,Ua: NEGATIVE
Nitrite: NEGATIVE
Protein, ur: NEGATIVE mg/dL
Specific Gravity, Urine: 1.02 (ref 1.005–1.030)
pH: 6.5 (ref 5.0–8.0)

## 2020-07-26 LAB — WET PREP, GENITAL
Clue Cells Wet Prep HPF POC: NONE SEEN
Sperm: NONE SEEN
Trich, Wet Prep: NONE SEEN
Yeast Wet Prep HPF POC: NONE SEEN

## 2020-07-26 MED ORDER — ACETAMINOPHEN 500 MG PO TABS
1000.0000 mg | ORAL_TABLET | Freq: Four times a day (QID) | ORAL | Status: DC | PRN
  Administered 2020-07-26: 1000 mg via ORAL
  Filled 2020-07-26: qty 2

## 2020-07-26 MED ORDER — CYCLOBENZAPRINE HCL 5 MG PO TABS
10.0000 mg | ORAL_TABLET | Freq: Three times a day (TID) | ORAL | Status: DC | PRN
  Administered 2020-07-26: 10 mg via ORAL
  Filled 2020-07-26: qty 2

## 2020-07-26 MED ORDER — CYCLOBENZAPRINE HCL 10 MG PO TABS: 10.0000 mg | ORAL_TABLET | Freq: Three times a day (TID) | ORAL | 0 refills | Status: DC | PRN

## 2020-07-26 NOTE — MAU Provider Note (Signed)
History     CSN: [id number]  Arrival date and time: 07/26/20 1038   First Provider Initiated Contact with Patient 07/26/20 1117      Chief Complaint  Patient presents with  . Abdominal Pain   24 y.o. G2P0010 @29.3 wks presenting with ctx. Reports onset yesterday. Frequency is about once an hour. Rates pain 10/10. Has not tried anything for it. Associated sx is bilateral LBP. Denies urinary sx. Denies VB or vaginal discharge. Reports +FM. No GI sx.   OB History    Gravida  2   Para  0   Term  0   Preterm  0   AB  1   Living  0     SAB  1   TAB  0   Ectopic  0   Multiple  0   Live Births  0           Past Medical History:  Diagnosis Date  . ADHD (attention deficit hyperactivity disorder)   . Bacterial vaginosis   . BRCA negative 10/2019   MyRisk neg  . Family history of breast cancer   . Increased risk of breast cancer 10/2019   IBIS=25.3%/riskscore=20.2%  . Yeast infection     Past Surgical History:  Procedure Laterality Date  . CHOLECYSTECTOMY    . NO PAST SURGERIES      Family History  Problem Relation Age of Onset  . Thyroid disease Paternal Grandmother   . Ovarian cancer Paternal Grandmother 24  . Breast cancer Maternal Aunt 32  . Breast cancer Other 62  . Cervical cancer Paternal Aunt 28    Social History   Tobacco Use  . Smoking status: Former Smoker    Packs/day: 0.50    Types: Cigarettes    Quit date: 03/24/2020    Years since quitting: 0.3  . Smokeless tobacco: Never Used  Vaping Use  . Vaping Use: Former  Substance Use Topics  . Alcohol use: Not Currently  . Drug use: No    Allergies:  Allergies  Allergen Reactions  . Other Other (See Comments)    Seasonal Allergies    No medications prior to admission.    Review of Systems  Gastrointestinal: Positive for abdominal pain (ctx). Negative for constipation, diarrhea, nausea and vomiting.  Genitourinary: Negative for dysuria, hematuria, urgency, vaginal bleeding and  vaginal discharge.  Musculoskeletal: Positive for back pain.   Physical Exam   Blood pressure 107/63, pulse 79, temperature 98.7 F (37.1 C), temperature source Oral, resp. rate 20, height 5' 3" (1.6 m), weight 82.1 kg, last menstrual period 01/02/2020, SpO2 100 %.  Physical Exam Vitals and nursing note reviewed. Exam conducted with a chaperone present.  Constitutional:      General: She is not in acute distress. HENT:     Head: Normocephalic and atraumatic.  Pulmonary:     Effort: Pulmonary effort is normal. No respiratory distress.  Abdominal:     General: There is no distension.     Palpations: Abdomen is soft.     Tenderness: There is no abdominal tenderness.     Comments: gravid  Genitourinary:    Comments: VE: closed/thick Musculoskeletal:        General: Normal range of motion.  Skin:    General: Skin is warm and dry.  Neurological:     General: No focal deficit present.     Mental Status: She is alert.  Psychiatric:        Mood and Affect: Mood normal.     EFM: 140 bpm, mod variability, + accels, no decels Toco: none  Results for orders placed or performed during the hospital encounter of 07/26/20 (from the past 24 hour(s))  Urinalysis, Routine w reflex microscopic     Status: Abnormal   Collection Time: 07/26/20 10:58 AM  Result Value Ref Range   Color, Urine YELLOW YELLOW   APPearance CLOUDY (A) CLEAR   Specific Gravity, Urine 1.020 1.005 - 1.030   pH 6.5 5.0 - 8.0   Glucose, UA NEGATIVE NEGATIVE mg/dL   Hgb urine dipstick NEGATIVE NEGATIVE   Bilirubin Urine NEGATIVE NEGATIVE   Ketones, ur NEGATIVE NEGATIVE mg/dL   Protein, ur NEGATIVE NEGATIVE mg/dL   Nitrite NEGATIVE NEGATIVE   Leukocytes,Ua NEGATIVE NEGATIVE  Wet prep, genital     Status: Abnormal   Collection Time: 07/26/20 11:28 AM  Result Value Ref Range   Yeast Wet Prep HPF POC NONE SEEN NONE SEEN   Trich, Wet Prep NONE SEEN NONE SEEN   Clue Cells Wet Prep HPF POC NONE SEEN NONE SEEN   WBC, Wet  Prep HPF POC MANY (A) NONE SEEN   Sperm NONE SEEN    MAU Course  Procedures Flexeril Tylenol Po hydration  MDM Labs ordered and reviewed. No evidence of UTI or PTL. Pain improved after meds. Stable for discharge home.    Assessment and Plan   1. [redacted] weeks gestation of pregnancy   2. Encounter for supervision of low-risk pregnancy, antepartum   3. NST (non-stress test) reactive   4. Braxton Hicks contractions   5. Back pain affecting pregnancy in third trimester    Discharge home Follow up at [facility] this week as scheduled PTL precautions Rx Flexeril  Allergies as of 07/26/2020      Reactions   Other Other (See Comments)   Seasonal Allergies      Medication List    TAKE these medications   Comfort Fit Maternity Supp Lg Misc 1 Units by Does not apply route daily.   Concept OB 130-92.4-1 MG Caps Take 1 capsule by mouth daily.   cyclobenzaprine 10 MG tablet Commonly known as: FLEXERIL Take 1 tablet (10 mg total) by mouth 3 (three) times daily as needed (back pain).      Julianne Handler, CNM 07/26/2020, 1:02 PM

## 2020-07-26 NOTE — MAU Note (Signed)
Presents with c/o abdominal pain, reports she thinks it's [employer].  States pain became stronger last night, but pain has been since yesterday.  Denies VB or LOF.  Reports FM last night, none today.

## 2020-07-26 NOTE — Discharge Instructions (Signed)
Braxton Hicks Contractions °Contractions of the uterus can occur throughout pregnancy, but they are not always a sign that you are in labor. You may have practice contractions called Braxton Hicks contractions. These false labor contractions are sometimes confused with true labor. °What are Braxton Hicks contractions? °Braxton Hicks contractions are tightening movements that occur in the muscles of the uterus before labor. Unlike true labor contractions, these contractions do not result in opening (dilation) and thinning of the cervix. Toward the end of pregnancy (32-34 weeks), Braxton Hicks contractions can happen more often and may become stronger. These contractions are sometimes difficult to tell apart from true labor because they can be very uncomfortable. You should not feel embarrassed if you go to the hospital with false labor. °Sometimes, the only way to tell if you are in true labor is for your health care provider to look for changes in the cervix. The health care provider will do a physical exam and may monitor your contractions. If you are not in true labor, the exam should show that your cervix is not dilating and your water has not broken. °If there are no other health problems associated with your pregnancy, it is completely safe for you to be sent home with false labor. You may continue to have Braxton Hicks contractions until you go into true labor. °How to tell the difference between true labor and false labor °True labor °· Contractions last 30-70 seconds. °· Contractions become very regular. °· Discomfort is usually felt in the top of the uterus, and it spreads to the lower abdomen and low back. °· Contractions do not go away with walking. °· Contractions usually become more intense and increase in frequency. °· The cervix dilates and gets thinner. °False labor °· Contractions are usually shorter and not as strong as true labor contractions. °· Contractions are usually irregular. °· Contractions  are often felt in the front of the lower abdomen and in the groin. °· Contractions may go away when you walk around or change positions while lying down. °· Contractions get weaker and are shorter-lasting as time goes on. °· The cervix usually does not dilate or become thin. °Follow these instructions at home: ° °· Take over-the-counter and prescription medicines only as told by your health care provider. °· Keep up with your usual exercises and follow other instructions from your health care provider. °· Eat and drink lightly if you think you are going into labor. °· If Braxton Hicks contractions are making you uncomfortable: °? Change your position from lying down or resting to walking, or change from walking to resting. °? Sit and rest in a tub of warm water. °? Drink enough fluid to keep your urine pale yellow. Dehydration may cause these contractions. °? Do slow and deep breathing several times an hour. °· Keep all follow-up prenatal visits as told by your health care provider. This is important. °Contact a health care provider if: °· You have a fever. °· You have continuous pain in your abdomen. °Get help right away if: °· Your contractions become stronger, more regular, and closer together. °· You have fluid leaking or gushing from your vagina. °· You pass blood-tinged mucus (bloody show). °· You have bleeding from your vagina. °· You have low back pain that you never had before. °· You feel your baby’s head pushing down and causing pelvic pressure. °· Your baby is not moving inside you as much as it used to. °Summary °· Contractions that occur before labor are   called Braxton Hicks contractions, false labor, or practice contractions.  Braxton Hicks contractions are usually shorter, weaker, farther apart, and less regular than true labor contractions. True labor contractions usually become progressively stronger and regular, and they become more frequent.  Manage discomfort from Braxton Hicks contractions  by changing position, resting in a warm bath, drinking plenty of water, or practicing deep breathing. This information is not intended to replace advice given to you by your health care provider. Make sure you discuss any questions you have with your health care provider. Document Revised: 11/22/2017 Document Reviewed: 04/25/2017 Elsevier Patient Education  2020 [employer]   Back Pain in Pregnancy Back pain during pregnancy is common. Back pain may be caused by several factors that are related to changes during your pregnancy. Follow these instructions at home: Managing pain, stiffness, and swelling      If directed, for sudden (acute) back pain, put ice on the painful area. ? Put ice in a plastic bag. ? Place a towel between your skin and the bag. ? Leave the ice on for 20 minutes, 2-3 times per day.  If directed, apply heat to the affected area before you exercise. Use the heat source that your health care provider recommends, such as a moist heat pack or a heating pad. ? Place a towel between your skin and the heat source. ? Leave the heat on for 20-30 minutes. ? Remove the heat if your skin turns bright red. This is especially important if you are unable to feel pain, heat, or cold. You may have a greater risk of getting burned.  If directed, massage the affected area. Activity  Exercise as told by your health care provider. Gentle exercise is the best way to prevent or manage back pain.  Listen to your body when lifting. If lifting hurts, ask for help or bend your knees. This uses your leg muscles instead of your back muscles.  Squat down when picking up something from the floor. Do not bend over.  Only use bed rest for short periods as told by your health care provider. Bed rest should only be used for the most severe episodes of back pain. Standing, sitting, and lying down  Do not stand in one place for long periods of time.  Use good posture when sitting. Make sure  your head rests over your shoulders and is not hanging forward. Use a pillow on your lower back if necessary.  Try sleeping on your side, preferably the left side, with a pregnancy support pillow or 1-2 regular pillows between your legs. ? If you have back pain after a night's rest, your bed may be too soft. ? A firm mattress may provide more support for your back during pregnancy. General instructions  Do not wear high heels.  Eat a healthy diet. Try to gain weight within your health care provider's recommendations.  Use a maternity girdle, elastic sling, or back brace as told by your health care provider.  Take over-the-counter and prescription medicines only as told by your health care provider.  Work with a physical therapist or massage therapist to find ways to manage back pain. Acupuncture or massage therapy may be helpful.  Keep all follow-up visits as told by your health care provider. This is important. Contact a health care provider if:  Your back pain interferes with your daily activities.  You have increasing pain in other parts of your body. Get help right away if:  You develop numbness, tingling, weakness,   or problems with the use of your arms or legs.  You develop severe back pain that is not controlled with medicine.  You have a change in bowel or bladder control.  You develop shortness of breath, dizziness, or you faint.  You develop nausea, vomiting, or sweating.  You have back pain that is a rhythmic, cramping pain similar to labor pains. Labor pain is usually 1-2 minutes apart, lasts for about 1 minute, and involves a bearing down feeling or pressure in your pelvis.  You have back pain and your water breaks or you have vaginal bleeding.  You have back pain or numbness that travels down your leg.  Your back pain developed after you fell.  You develop pain on one side of your back.  You see blood in your urine.  You develop skin blisters in the area of  your back pain. Summary  Back pain may be caused by several factors that are related to changes during your pregnancy.  Follow instructions as told by your health care provider for managing pain, stiffness, and swelling.  Exercise as told by your health care provider. Gentle exercise is the best way to prevent or manage back pain.  Take over-the-counter and prescription medicines only as told by your health care provider.  Keep all follow-up visits as told by your health care provider. This is important. This information is not intended to replace advice given to you by your health care provider. Make sure you discuss any questions you have with your health care provider. Document Revised: 03/31/2019 Document Reviewed: 05/28/2018 Elsevier Patient Education  2020 Elsevier Inc.  

## 2020-07-27 LAB — GC/CHLAMYDIA PROBE AMP (~~LOC~~) NOT AT ARMC
Chlamydia: NEGATIVE
Comment: NEGATIVE
Comment: NORMAL
Neisseria Gonorrhea: NEGATIVE

## 2020-08-21 ENCOUNTER — Inpatient Hospital Stay (HOSPITAL_COMMUNITY)
Admission: AD | Admit: 2020-08-21 | Discharge: 2020-08-21 | Disposition: A | Discharge status: Home / Self Care | Payer: Medicaid Other | Source: Ambulatory Visit | Attending: Obstetrics & Gynecology | Admitting: Obstetrics & Gynecology

## 2020-08-21 ENCOUNTER — Other Ambulatory Visit: Payer: Self-pay

## 2020-08-21 ENCOUNTER — Encounter (HOSPITAL_COMMUNITY): Payer: Self-pay | Admitting: Obstetrics & Gynecology

## 2020-08-21 DIAGNOSIS — O99283 Endocrine, nutritional and metabolic diseases complicating pregnancy, third trimester: Secondary | ICD-10-CM | POA: Diagnosis not present

## 2020-08-21 DIAGNOSIS — Z3490 Encounter for supervision of normal pregnancy, unspecified, unspecified trimester: Secondary | ICD-10-CM

## 2020-08-21 DIAGNOSIS — R030 Elevated blood-pressure reading, without diagnosis of hypertension: Secondary | ICD-10-CM | POA: Diagnosis not present

## 2020-08-21 DIAGNOSIS — Z8759 Personal history of other complications of pregnancy, childbirth and the puerperium: Secondary | ICD-10-CM | POA: Diagnosis not present

## 2020-08-21 DIAGNOSIS — Z3A33 33 weeks gestation of pregnancy: Secondary | ICD-10-CM

## 2020-08-21 DIAGNOSIS — O26893 Other specified pregnancy related conditions, third trimester: Secondary | ICD-10-CM | POA: Diagnosis not present

## 2020-08-21 DIAGNOSIS — R110 Nausea: Secondary | ICD-10-CM | POA: Diagnosis not present

## 2020-08-21 DIAGNOSIS — Z87891 Personal history of nicotine dependence: Secondary | ICD-10-CM | POA: Diagnosis not present

## 2020-08-21 DIAGNOSIS — O99891 Other specified diseases and conditions complicating pregnancy: Secondary | ICD-10-CM

## 2020-08-21 DIAGNOSIS — Z9049 Acquired absence of other specified parts of digestive tract: Secondary | ICD-10-CM | POA: Insufficient documentation

## 2020-08-21 DIAGNOSIS — M549 Dorsalgia, unspecified: Secondary | ICD-10-CM | POA: Insufficient documentation

## 2020-08-21 DIAGNOSIS — E860 Dehydration: Secondary | ICD-10-CM | POA: Insufficient documentation

## 2020-08-21 LAB — CBC
HCT: 32.4 % — ABNORMAL LOW (ref 36.0–46.0)
Hemoglobin: 10.9 g/dL — ABNORMAL LOW (ref 12.0–15.0)
MCH: 30.4 pg (ref 26.0–34.0)
MCHC: 33.6 g/dL (ref 30.0–36.0)
MCV: 90.5 fL (ref 80.0–100.0)
Platelets: 178 10*3/uL (ref 150–400)
RBC: 3.58 MIL/uL — ABNORMAL LOW (ref 3.87–5.11)
RDW: 13.6 % (ref 11.5–15.5)
WBC: 16.9 10*3/uL — ABNORMAL HIGH (ref 4.0–10.5)
nRBC: 0 % (ref 0.0–0.2)

## 2020-08-21 LAB — COMPREHENSIVE METABOLIC PANEL
ALT: 11 U/L (ref 0–44)
AST: 12 U/L — ABNORMAL LOW (ref 15–41)
Albumin: 3.1 g/dL — ABNORMAL LOW (ref 3.5–5.0)
Alkaline Phosphatase: 79 U/L (ref 38–126)
Anion gap: 9 (ref 5–15)
BUN: 7 mg/dL (ref 6–20)
CO2: 20 mmol/L — ABNORMAL LOW (ref 22–32)
Calcium: 8.8 mg/dL — ABNORMAL LOW (ref 8.9–10.3)
Chloride: 106 mmol/L (ref 98–111)
Creatinine, Ser: 0.59 mg/dL (ref 0.44–1.00)
GFR calc Af Amer: >60 mL/min (ref >60)
GFR calc non Af Amer: >60 mL/min (ref >60)
Glucose, Bld: 111 mg/dL — ABNORMAL HIGH (ref 70–99)
Potassium: 3.7 mmol/L (ref 3.5–5.1)
Sodium: 135 mmol/L (ref 135–145)
Total Bilirubin: 0.2 mg/dL — ABNORMAL LOW (ref 0.3–1.2)
Total Protein: 6.4 g/dL — ABNORMAL LOW (ref 6.5–8.1)

## 2020-08-21 LAB — URINALYSIS, ROUTINE W REFLEX MICROSCOPIC
Bilirubin Urine: NEGATIVE
Glucose, UA: 50 mg/dL — AB
Hgb urine dipstick: NEGATIVE
Ketones, ur: 80 mg/dL — AB
Leukocytes,Ua: NEGATIVE
Nitrite: NEGATIVE
Protein, ur: NEGATIVE mg/dL
Specific Gravity, Urine: 1.026 (ref 1.005–1.030)
pH: 5 (ref 5.0–8.0)

## 2020-08-21 MED ORDER — LACTATED RINGERS IV SOLN: INTRAVENOUS | Status: DC

## 2020-08-21 MED ORDER — ONDANSETRON 4 MG PO TBDP
8.0000 mg | ORAL_TABLET | Freq: Once | ORAL | Status: AC
  Administered 2020-08-21: 8 mg via ORAL
  Filled 2020-08-21: qty 2

## 2020-08-21 MED ORDER — ACETAMINOPHEN 500 MG PO TABS
1000.0000 mg | ORAL_TABLET | Freq: Once | ORAL | Status: AC
  Administered 2020-08-21: 1000 mg via ORAL
  Filled 2020-08-21: qty 2

## 2020-08-21 MED ORDER — CYCLOBENZAPRINE HCL 5 MG PO TABS
10.0000 mg | ORAL_TABLET | Freq: Once | ORAL | Status: AC
  Administered 2020-08-21: 10 mg via ORAL
  Filled 2020-08-21: qty 2

## 2020-08-21 MED ORDER — LACTATED RINGERS IV SOLN: Freq: Once | INTRAVENOUS | Status: AC

## 2020-08-21 NOTE — MAU Note (Signed)
Ice applied to lower back.

## 2020-08-21 NOTE — Discharge Instructions (Signed)
Continue with Flexeril that you have at home up to three times a day. Use ice to your back as discussed at least twice a day. Keep your appointment in the office on Wednesday. Return to MAU if your symptoms worsen.

## 2020-08-21 NOTE — MAU Provider Note (Signed)
History     CSN: [id number]  Arrival date and time: 08/21/20 1517   First Provider Initiated Contact with Patient 08/21/20 1558      Chief Complaint  Patient presents with  . Contractions   HPI Michele Fields 24 y.o. [redacted]w[redacted]d Comes to MAU with low back pain in the midline and she lost her mucus plug.   Her first pregnancy was a miscarriage.  Had intercourse last night and now is having intermittent low back pain.  Her BP is elevated and she has tachycardia.  Is feeling hot but does not have fever.  States she feels like it is hard to breathe.  Also feeling the baby move in the LLQ and it is uncomfortable.  Has had nausea throughout the pregnancy and is nauseated with pain today.  Did try a flexeril tablet earlier and it did not help her pain   She was standing in the room leaning over the bed with a friend giving hip squeeze and some low back massage.   OB History    Gravida  2   Para  0   Term  0   Preterm  0   AB  1   Living  0     SAB  1   TAB  0   Ectopic  0   Multiple  0   Live Births  0           Past Medical History:  Diagnosis Date  . ADHD (attention deficit hyperactivity disorder)   . Bacterial vaginosis   . BRCA negative 10/2019   MyRisk neg  . Family history of breast cancer   . Increased risk of breast cancer 10/2019   IBIS=25.3%/riskscore=20.2%  . Yeast infection     Past Surgical History:  Procedure Laterality Date  . CHOLECYSTECTOMY    . NO PAST SURGERIES      Family History  Problem Relation Age of Onset  . Thyroid disease Paternal Grandmother   . Ovarian cancer Paternal Grandmother 23  . Breast cancer Maternal Aunt 32  . Breast cancer Other 60  . Cervical cancer Paternal Aunt 86    Social History   Tobacco Use  . Smoking status: Former Smoker    Packs/day: 0.50    Types: Cigarettes    Quit date: 03/24/2020    Years since quitting: 0.4  . Smokeless tobacco: Never Used  Vaping Use  . Vaping Use: Former  Substance Use Topics   . Alcohol use: Not Currently  . Drug use: No    Allergies:  Allergies  Allergen Reactions  . Other Other (See Comments)    Seasonal Allergies    Medications Prior to Admission  Medication Sig Dispense Refill Last Dose  . cyclobenzaprine (FLEXERIL) 10 MG tablet Take 1 tablet (10 mg total) by mouth 3 (three) times daily as needed (back pain). 30 tablet 0   . Elastic Bandages & Supports (COMFORT FIT MATERNITY SUPP LG) MISC 1 Units by Does not apply route daily. 1 each 0   . Prenat w/o A Vit-FeFum-FePo-FA (CONCEPT OB) 130-92.4-1 MG CAPS Take 1 capsule by mouth daily. 30 capsule 11     Review of Systems Physical Exam   Blood pressure (!) 149/95, pulse (!) 141, temperature 98.9 F (37.2 C), temperature source Oral, resp. rate 18, last menstrual period 01/02/2020, SpO2 100 %.  Physical Exam Vitals and nursing note reviewed.  Constitutional:      Appearance: She is well-developed.  HENT:  Head: Normocephalic.  Cardiovascular:     Rate and Rhythm: Tachycardia present.  Abdominal:     Palpations: Abdomen is soft.     Tenderness: There is abdominal tenderness. There is no guarding or rebound.     Comments: Tenderness in LLQ with baby moving FHT 140 with moderate variability and 15x15 accels noted No decelerations Contractions irregular Category 1 tracing  Genitourinary:    Comments: Bimanual exam Cervix soft but closed Vertex high and ballotable Musculoskeletal:        General: Normal range of motion.     Cervical back: Neck supple.     Comments: Pain in low back - sacral area  Skin:    General: Skin is warm and dry.  Neurological:     Mental Status: She is alert and oriented to person, place, and time.     MAU Course  Procedures  MDM BP elevated - will draw pre eclampsia baseline Will start IVF infusion to see if pain will lessen with fluids.    She had flexeril earlier today.  Will give Zofran for nausea - has not had that today.  Last BP was today. 6 pm -  Reports her back pain is still there.  Is lying comfortably in bed and her friend is sitting in the room.  With the IVF she no longer needs back massage with any contractions.  She reports she is having more contractions but few are on the strip and none are palpated by the nurse.  Will give a second dose of flexeril for today and tylenol.  Heart rate is much better and client is not hot at this time.  Breathing easily and no reports of distress with breathing. 7:30 pm  Continues to have periodic back pain in the lower sacral area just above the gluteal crest.  Also having a "shocking pain" in the vagina.  Provider sat by her bed for 10 minutes and no contractions palpated and no contractions on her strip even though periodically she would have this pain in her back.  Discussed normal aches and pains in pregnancy - vaginal pain is very normal.  Ice pack to her back while bag of fluids is infusing.  Currently BP is normal and pulse is normal. 8:50 pm Ice to her back helped while in place but then back pain returned.  Discussed some of her pain is likely due to normal aches and pains in pregnancy.  Reviewed flexeril, ice, Biofreeze and stretching exercises to use daily to help with back pain.  Advised to continue to use these as sometimes more than once is needed for the musculoskeletal pain to begin to resolve. BP normal and pulse is normal.  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 Reviewed how to stretch out lower back as likely her pain is musculoskeletal as is common in late pregnancy. Advised ice BID for 15 minutes to lower back. Plan to keep appointments as scheduled in the office. Advised no sex for now as prior to having intercourse, she was not having this pain. Cervix was closed on exam.  No labor contractions palpated. Keep appointment in the office on Wednesday.  Brian Kocourek L Miguel Christiana 08/21/2020, 4:27 PM

## 2020-08-21 NOTE — MAU Note (Signed)
Michele Fields is a 24 y.o. at [redacted]w[redacted]d here in MAU reporting: states she had IC last night and it caused abdominal pain. Lost her mucus plug this morning. Been having contractions all day, states they are frequent and have gotten worse the past hour. No bright red bleeding, no LOF. +FM  Onset of complaint: today  Pain score: 10/10  Vitals:   08/21/20 1532  BP: (!) 140/104  Pulse: (!) 123  Resp: 18  Temp: 98.9 F (37.2 C)  SpO2: 100%     FHT: +FM  Lab orders placed from triage: [facility]

## 2020-09-08 LAB — OB RESULTS CONSOLE GBS: GBS: NEGATIVE

## 2020-09-11 ENCOUNTER — Inpatient Hospital Stay (HOSPITAL_COMMUNITY)
Admission: AD | Admit: 2020-09-11 | Discharge: 2020-09-12 | Disposition: A | Discharge status: Home / Self Care | Payer: Medicaid Other | Attending: Obstetrics and Gynecology | Admitting: Obstetrics and Gynecology

## 2020-09-11 ENCOUNTER — Other Ambulatory Visit: Payer: Self-pay

## 2020-09-11 ENCOUNTER — Ambulatory Visit
Admission: EM | Admit: 2020-09-11 | Discharge: 2020-09-11 | Disposition: A | Discharge status: Home / Self Care | Payer: Medicaid Other

## 2020-09-11 ENCOUNTER — Encounter (HOSPITAL_COMMUNITY): Payer: Self-pay | Admitting: Obstetrics and Gynecology

## 2020-09-11 DIAGNOSIS — F909 Attention-deficit hyperactivity disorder, unspecified type: Secondary | ICD-10-CM | POA: Diagnosis not present

## 2020-09-11 DIAGNOSIS — O99343 Other mental disorders complicating pregnancy, third trimester: Secondary | ICD-10-CM | POA: Diagnosis not present

## 2020-09-11 DIAGNOSIS — O4703 False labor before 37 completed weeks of gestation, third trimester: Secondary | ICD-10-CM | POA: Diagnosis not present

## 2020-09-11 DIAGNOSIS — Z3490 Encounter for supervision of normal pregnancy, unspecified, unspecified trimester: Secondary | ICD-10-CM

## 2020-09-11 DIAGNOSIS — Z87891 Personal history of nicotine dependence: Secondary | ICD-10-CM | POA: Insufficient documentation

## 2020-09-11 DIAGNOSIS — M542 Cervicalgia: Secondary | ICD-10-CM

## 2020-09-11 DIAGNOSIS — Z3A36 36 weeks gestation of pregnancy: Secondary | ICD-10-CM | POA: Diagnosis not present

## 2020-09-11 DIAGNOSIS — M25511 Pain in right shoulder: Secondary | ICD-10-CM

## 2020-09-11 LAB — URINALYSIS, ROUTINE W REFLEX MICROSCOPIC
Bilirubin Urine: NEGATIVE
Glucose, UA: 150 mg/dL — AB
Hgb urine dipstick: NEGATIVE
Ketones, ur: 20 mg/dL — AB
Nitrite: NEGATIVE
Protein, ur: NEGATIVE mg/dL
Specific Gravity, Urine: 1.015 (ref 1.005–1.030)
pH: 5 (ref 5.0–8.0)

## 2020-09-11 LAB — COMPREHENSIVE METABOLIC PANEL
ALT: 13 U/L (ref 0–44)
AST: 19 U/L (ref 15–41)
Albumin: 2.9 g/dL — ABNORMAL LOW (ref 3.5–5.0)
Alkaline Phosphatase: 105 U/L (ref 38–126)
Anion gap: 11 (ref 5–15)
BUN: <5 mg/dL — ABNORMAL LOW (ref 6–20)
CO2: 17 mmol/L — ABNORMAL LOW (ref 22–32)
Calcium: 9.1 mg/dL (ref 8.9–10.3)
Chloride: 108 mmol/L (ref 98–111)
Creatinine, Ser: 0.61 mg/dL (ref 0.44–1.00)
GFR calc Af Amer: >60 mL/min (ref >60)
GFR calc non Af Amer: >60 mL/min (ref >60)
Glucose, Bld: 147 mg/dL — ABNORMAL HIGH (ref 70–99)
Potassium: 3.9 mmol/L (ref 3.5–5.1)
Sodium: 136 mmol/L (ref 135–145)
Total Bilirubin: 0.3 mg/dL (ref 0.3–1.2)
Total Protein: 6.3 g/dL — ABNORMAL LOW (ref 6.5–8.1)

## 2020-09-11 LAB — CBC
HCT: 34 % — ABNORMAL LOW (ref 36.0–46.0)
Hemoglobin: 11.3 g/dL — ABNORMAL LOW (ref 12.0–15.0)
MCH: 31.2 pg (ref 26.0–34.0)
MCHC: 33.2 g/dL (ref 30.0–36.0)
MCV: 93.9 fL (ref 80.0–100.0)
Platelets: 152 10*3/uL (ref 150–400)
RBC: 3.62 MIL/uL — ABNORMAL LOW (ref 3.87–5.11)
RDW: 14.2 % (ref 11.5–15.5)
WBC: 15.3 10*3/uL — ABNORMAL HIGH (ref 4.0–10.5)
nRBC: 0 % (ref 0.0–0.2)

## 2020-09-11 MED ORDER — LACTATED RINGERS IV BOLUS
1000.0000 mL | Freq: Once | INTRAVENOUS | Status: AC
  Administered 2020-09-11: 1000 mL via INTRAVENOUS

## 2020-09-11 MED ORDER — NIFEDIPINE 10 MG PO CAPS
10.0000 mg | ORAL_CAPSULE | ORAL | Status: AC | PRN
  Administered 2020-09-11 (×3): 10 mg via ORAL
  Filled 2020-09-11 (×3): qty 1

## 2020-09-11 MED ORDER — LIDOCAINE 5 % EX PTCH: 1.0000 | MEDICATED_PATCH | CUTANEOUS | 0 refills | Status: DC

## 2020-09-11 NOTE — ED Triage Notes (Signed)
Pt c/o right shoulder and neck pain that has lasted over the past 4 days.  She states no relief from home interventions.

## 2020-09-11 NOTE — MAU Provider Note (Addendum)
Patient Michele Fields is a 24 y.o. G2P0010  at [redacted]w[redacted]d here with complaints of pain in her vagina and tightness in her belly. These pains started this morning but got worse around 8:30. She called her provider at 9;30 and they told her to come in and been evaluated. She denies blurry vision, floating spots, decreased fetal movements, HA, VB, LOF, RUQ pain. She reports that her provider told her the baby is measuring "big" and that she won't "make it" to 40 weeks.   She reports that she only has high blood pressure at the hospital, but it is normal in the clinic. She thinks her elevated BPs today are due to pain.   She has had an uncomplicated pregnancy otherwise.  History     CSN: [id number]  Arrival date and time: 09/11/20 2116   First Provider Initiated Contact with Patient 09/11/20 2225      Chief Complaint  Patient presents with   Pelvic Pain   HPI The patient's primary symptoms include pelvic pain. The patient's pertinent negatives include no genital itching or genital lesions. This is a new problem. The problem occurs constantly. Pertinent negatives include no constipation, diarrhea, dysuria, fever, urgency or vomiting. Nothing aggravates the symptoms.  Her tightening comes and goes; it started this morning but has continued this evening and now she feels very uncomfortable because of her vaginal pain as well.  OB History    Gravida  2   Para  0   Term  0   Preterm  0   AB  1   Living  0     SAB  1   TAB  0   Ectopic  0   Multiple  0   Live Births  0           Past Medical History:  Diagnosis Date   ADHD (attention deficit hyperactivity disorder)    Bacterial vaginosis    BRCA negative 10/2019   MyRisk neg   Family history of breast cancer    Increased risk of breast cancer 10/2019   IBIS=25.3%/riskscore=20.2%   Yeast infection     Past Surgical History:  Procedure Laterality Date   CHOLECYSTECTOMY     NO PAST SURGERIES      Family  History  Problem Relation Age of Onset   Thyroid disease Paternal Grandmother    Ovarian cancer Paternal Grandmother 24   Breast cancer Maternal Aunt 32   Breast cancer Other 62   Cervical cancer Paternal Aunt 47    Social History   Tobacco Use   Smoking status: Former Smoker    Packs/day: 0.50    Types: Cigarettes    Quit date: 03/24/2020    Years since quitting: 0.4   Smokeless tobacco: Never Used  Vaping Use   Vaping Use: Former  Substance Use Topics   Alcohol use: Not Currently   Drug use: No    Allergies:  Allergies  Allergen Reactions   Other Other (See Comments)    Seasonal Allergies    Medications Prior to Admission  Medication Sig Dispense Refill Last Dose   cyclobenzaprine (FLEXERIL) 10 MG tablet Take 1 tablet (10 mg total) by mouth 3 (three) times daily as needed (back pain). 30 tablet 0 09/11/2020 at 6-7pm   Prenat w/o A Vit-FeFum-FePo-FA (CONCEPT OB) 130-92.4-1 MG CAPS Take 1 capsule by mouth daily. 30 capsule 11 09/11/2020 at Unknown time   Elastic Bandages & Supports (COMFORT FIT MATERNITY SUPP LG) MISC 1 Units by Does  not apply route daily. 1 each 0    lidocaine (LIDODERM) 5 % Place 1 patch onto the skin daily. Remove & Discard patch within 12 hours or as directed by MD 30 patch 0    promethazine (PHENERGAN) 12.5 MG tablet Take 12.5 mg by mouth every 6 (six) hours as needed.       Review of Systems  Constitutional: Negative.   HENT: Negative.   Respiratory: Negative.   Cardiovascular: Negative.   Gastrointestinal: Negative.   Genitourinary: Negative.   Musculoskeletal: Negative.   Neurological: Negative.    Physical Exam   Blood pressure (!) 147/80, pulse (!) 121, temperature 98.7 F (37.1 C), temperature source Oral, resp. rate 17, height $RemoveBe'5\' 3"'ljBFxnoQJ$  (1.6 m), weight 92.4 kg, last menstrual period 01/02/2020.  Physical Exam Constitutional:      Appearance: Normal appearance. She is normal weight.  Abdominal:     General: There is no  distension.     Tenderness: There is no abdominal tenderness. There is no guarding.  Genitourinary:    General: Normal vulva.  Musculoskeletal:        General: Normal range of motion.  Skin:    General: Skin is warm.  Neurological:     General: No focal deficit present.     Mental Status: She is alert.     MAU Course  Procedures  MDM NST: 130 bpm, mod var, present acel, no decels, freq contractions Will draw CBC, CMp and PCR as patient's BP is elevated.  Will give IV LR bolus x 2 Will try procardia for symptoms x 3> patient reports no relief although looks much more comfortable in bed. Is watching TV and using her phone.  CBC, CMP, PCR are all normal.  Patient's cervix is closed x2 during MAU course.   Patient Vitals for the past 24 hrs:  BP Temp Temp src Pulse Resp Height Weight  09/12/20 0000 140/84 -- -- (!) 120 -- -- --  09/11/20 2345 133/82 -- -- (!) 113 -- -- --  09/11/20 2330 140/89 -- -- (!) 116 -- -- --  09/11/20 2315 (!) 147/80 -- -- (!) 121 -- -- --  09/11/20 2300 (!) 143/93 -- -- (!) 119 -- -- --  09/11/20 2245 (!) 127/93 -- -- (!) 118 -- -- --  09/11/20 2215 (!) 146/100 -- -- (!) 123 -- -- --  09/11/20 2200 (!) 156/90 -- -- (!) 104 -- -- --  09/11/20 2150 (!) 139/91 -- -- (!) 117 -- -- --  09/11/20 2130 (!) 138/94 98.7 F (37.1 C) Oral (!) 121 17 $Remo'5\' 3"'regBp$  (1.6 m) 92.4 kg   Patient denies any s/s of pre-e (HA, blurry vision, floating spots, sudden swelling, RUQ pain). Patient is using phone and does not appear distressed while in MAU.   Assessment and Plan   1. Preterm labor in third trimester without delivery   2. Encounter for supervision of low-risk pregnancy, antepartum    2. Patient stable for discharge with plans to keep appt on Thursday. She does not know who she is seeing on Thursday. Strict pre-e precautions reviewed and when to return to MAU.   [facility] 09/11/2020, 11:21 PM

## 2020-09-11 NOTE — ED Provider Notes (Signed)
[facility]    CSN: [id number] Arrival date & time: 09/11/20  0913      History   Chief Complaint Chief Complaint  Patient presents with  . Shoulder Pain  . Neck Pain    HPI Michele Fields is a 24 y.o. female.   24 year old female who is [redacted] weeks pregnant comes in for 4 day history of right shoulder/neck pain. Denies injury/trauma. Thinks this is due to sleeping position. Pain worse with ROM. Has been trying tylenol, flexeril without relief.      Past Medical History:  Diagnosis Date  . ADHD (attention deficit hyperactivity disorder)   . Bacterial vaginosis   . BRCA negative 10/2019   MyRisk neg  . Family history of breast cancer   . Increased risk of breast cancer 10/2019   IBIS=25.3%/riskscore=20.2%  . Yeast infection     Patient Active Problem List   Diagnosis Date Noted  . Encounter for supervision of low-risk pregnancy, antepartum 02/29/2020  . Thyroid nodule 10/19/2014  . BMI (body mass index), pediatric, > 99% for age 67/23/2014  . ADD (attention deficit disorder) 07/15/2013  . Headache(784.0) 07/15/2013  . Allergic rhinitis 07/15/2013    Past Surgical History:  Procedure Laterality Date  . CHOLECYSTECTOMY    . NO PAST SURGERIES      OB History    Gravida  2   Para  0   Term  0   Preterm  0   AB  1   Living  0     SAB  1   TAB  0   Ectopic  0   Multiple  0   Live Births  0            Home Medications    Prior to Admission medications   Medication Sig Start Date End Date Taking? Authorizing Provider  cyclobenzaprine (FLEXERIL) 10 MG tablet Take 1 tablet (10 mg total) by mouth 3 (three) times daily as needed (back pain). 07/26/20   Julianne Handler, CNM  Elastic Bandages & Supports (COMFORT FIT MATERNITY SUPP LG) MISC 1 Units by Does not apply route daily. 06/29/20   Sparacino, Hailey L, DO  lidocaine (LIDODERM) 5 % Place 1 patch onto the skin daily. Remove & Discard patch within 12 hours or as directed by MD 09/11/20    Ok Edwards, PA-C  Prenat w/o A Vit-FeFum-FePo-FA (CONCEPT OB) 130-92.4-1 MG CAPS Take 1 capsule by mouth daily. 02/29/20   Gae Dry, MD  promethazine (PHENERGAN) 12.5 MG tablet Take 12.5 mg by mouth every 6 (six) hours as needed. 07/26/20   [provider]    Family History Family History  Problem Relation Age of Onset  . Thyroid disease Paternal Grandmother   . Ovarian cancer Paternal Grandmother 64  . Breast cancer Maternal Aunt 32  . Breast cancer Other 50  . Cervical cancer Paternal Aunt 52    Social History Social History   Tobacco Use  . Smoking status: Former Smoker    Packs/day: 0.50    Types: Cigarettes    Quit date: 03/24/2020    Years since quitting: 0.4  . Smokeless tobacco: Never Used  Vaping Use  . Vaping Use: Former  Substance Use Topics  . Alcohol use: Not Currently  . Drug use: No     Allergies   Other   Review of Systems Review of Systems  Reason unable to perform ROS: See HPI as above.     Physical Exam  Triage Vital Signs ED Triage Vitals  Enc Vitals Group     BP 09/11/20 0915 128/87     Pulse Rate 09/11/20 0915 97     Resp 09/11/20 0915 16     Temp 09/11/20 0915 97.7 F (36.5 C)     Temp Source 09/11/20 0915 Oral     SpO2 --      Weight --      Height --      Head Circumference --      Peak Flow --      Pain Score 09/11/20 0928 10     Pain Loc --      Pain Edu? --      Excl. in [place]? --    No data found.  Updated Vital Signs BP 128/87 (BP Location: Left Arm)   Pulse 97   Temp 97.7 F (36.5 C) (Oral)   Resp 16   LMP 01/02/2020 (Exact Date)   SpO2 99%   Physical Exam Constitutional:      General: She is not in acute distress.    Appearance: She is well-developed. She is not diaphoretic.  HENT:     Head: Normocephalic and atraumatic.  Eyes:     Conjunctiva/sclera: Conjunctivae normal.     Pupils: Pupils are equal, round, and reactive to light.  Neck:     Comments: No tenderness to spinous processes.  Tenderness along right neck. Full ROM Cardiovascular:     Rate and Rhythm: Normal rate and regular rhythm.     Heart sounds: Normal heart sounds. No murmur heard.  No friction rub. No gallop.   Pulmonary:     Effort: Pulmonary effort is normal. No accessory muscle usage or respiratory distress.     Breath sounds: Normal breath sounds. No stridor. No decreased breath sounds, wheezing, rhonchi or rales.  Musculoskeletal:     Cervical back: Normal range of motion and neck supple.     Comments: No tenderness on palpation of the spinous processes. Tenderness to palpation along right middle trapezius. No thoracic tenderness to palpation. Decreased ROM of shoulder. NVI  Skin:    General: Skin is warm and dry.  Neurological:     Mental Status: She is alert and oriented to person, place, and time.      UC Treatments / Results  Labs (all labs ordered are listed, but only abnormal results are displayed) Labs Reviewed - No data to display  EKG   Radiology No results found.  Procedures Procedures (including critical care time)  Medications Ordered in UC Medications - No data to display  Initial Impression / Assessment and Plan / UC Course  I have reviewed the triage vital signs and the nursing notes.  Pertinent labs & imaging results that were available during my care of the patient were reviewed by me and considered in my medical decision making (see chart for details).    Patient already on tylenol/flexeril per OB for back pain. Discussed limited treatment options due to pregnancy. Will add lidoderm for now. Patient to discuss with OB of current symptoms, follow up as scheduled for reevaluation. Return precautions given.  Final Clinical Impressions(s) / UC Diagnoses   Final diagnoses:  Neck pain on right side  Acute pain of right shoulder    ED Prescriptions    Medication Sig Dispense Auth. Provider   lidocaine (LIDODERM) 5 % Place 1 patch onto the skin daily. Remove &  Discard patch within 12 hours or as directed by MD 30  patch Ok Edwards, PA-C     PDMP not reviewed this encounter.   Ok Edwards, PA-C 09/11/20 1002

## 2020-09-11 NOTE — Discharge Instructions (Signed)
Continue flexeril and tylenol as directed. Can add lidoderm patches. Ice compress daily. Discuss with OB of current symptoms and follow up as scheduled for reevaluation.

## 2020-09-11 NOTE — MAU Note (Signed)
..  Michele Fields is a 24 y.o. at [redacted]w[redacted]d here in MAU reporting: Constant pelvic pain. Pt denies vaginal bleeding/CTX/LOF. Decreased movement the past hour.  Onset of complaint: All day with pain but it has increased in the last hour Pain score: 10/10 Vitals:   09/11/20 2130  BP: (!) 138/94  Pulse: (!) 121  Resp: 17  Temp: 98.7 F (37.1 C)     FHT: doppler 156 Lab orders placed from triage: UA

## 2020-09-12 LAB — PROTEIN / CREATININE RATIO, URINE
Creatinine, Urine: 66.47 mg/dL
Protein Creatinine Ratio: 0.18 mg/mg{Cre} — ABNORMAL HIGH (ref 0.00–0.15)
Total Protein, Urine: 12 mg/dL

## 2020-09-13 ENCOUNTER — Ambulatory Visit: Payer: Medicaid Other | Admitting: Neurology

## 2020-09-13 ENCOUNTER — Other Ambulatory Visit: Payer: Self-pay

## 2020-09-13 ENCOUNTER — Encounter: Payer: Self-pay | Admitting: Neurology

## 2020-09-13 VITALS — BP 130/68 | HR 108 | Ht 63.0 in | Wt 205.0 lb

## 2020-09-13 DIAGNOSIS — M545 Low back pain, unspecified: Secondary | ICD-10-CM

## 2020-09-13 DIAGNOSIS — G43709 Chronic migraine without aura, not intractable, without status migrainosus: Secondary | ICD-10-CM | POA: Diagnosis not present

## 2020-09-13 DIAGNOSIS — G8929 Other chronic pain: Secondary | ICD-10-CM | POA: Diagnosis not present

## 2020-09-13 DIAGNOSIS — Z3A37 37 weeks gestation of pregnancy: Secondary | ICD-10-CM

## 2020-09-13 DIAGNOSIS — IMO0002 Reserved for concepts with insufficient information to code with codable children: Secondary | ICD-10-CM | POA: Insufficient documentation

## 2020-09-13 LAB — OB RESULTS CONSOLE GC/CHLAMYDIA: Gonorrhea: NEGATIVE

## 2020-09-13 NOTE — Progress Notes (Signed)
[facility]  PATIENT: Michele Fields DOB: 10/11/1996  REFERRING DOCTOR OR PCP:  Crawford Givens, MD SOURCE: Patient, notes from primary care  _________________________________   HISTORICAL  CHIEF COMPLAINT:  Chief Complaint  Patient presents with  . New Patient (Initial Visit)    RM 12, alone. History of migraines. Worse when pregnant. Due 10/08/20.    HISTORY OF PRESENT ILLNESS:  I had the pleasure of seeing your patient, Michele Fields, at [facility] for neurologic consultation regarding her headaches.  She is a 24 year old woman who is currently [redacted] weeks pregnant.  She had migraine headaches since her teen years and saw Child Neurology.   She ws placed on amitriptyline, magnesium and riboflavin.   Migraines improved over the last couple years but have worsened over the past couple months, while pregnant.    HA's are bilateral and involve the entire head.   She has constant throbbing.    Moving intensifies the pain.   She has nausea but no vomiting.   She notes photophobia and phonophobia.   For the current headaches, she has tried promethazine and Tylenol without benefit.     She has no other medical issues.    She has had more LBP while pregnant.   REVIEW OF SYSTEMS: Constitutional: No fevers, chills, sweats, or change in appetite.  Currently 37 weeks pregnanat Eyes: No visual changes, double vision, eye pain Ear, nose and throat: No hearing loss, ear pain, nasal congestion, sore throat Cardiovascular: No chest pain, palpitations Respiratory: No shortness of breath at rest or with exertion.   No wheezes GastrointestinaI: No nausea, vomiting, diarrhea, abdominal pain, fecal incontinence Genitourinary: No dysuria, urinary retention or frequency.  No nocturia. Musculoskeletal:She has back pain, worse during pregnancy.  Integumentary: No rash, pruritus, skin lesions Neurological: as above Psychiatric: No depression at this time.  No  anxiety Endocrine: No palpitations, diaphoresis, change in appetite, change in weigh or increased thirst Hematologic/Lymphatic: No anemia, purpura, petechiae. Allergic/Immunologic: No itchy/runny eyes, nasal congestion, recent allergic reactions, rashes  ALLERGIES: Allergies  Allergen Reactions  . Other Other (See Comments)    Seasonal Allergies    HOME MEDICATIONS:  Current Outpatient Medications:  .  cyclobenzaprine (FLEXERIL) 10 MG tablet, Take 1 tablet (10 mg total) by mouth 3 (three) times daily as needed (back pain)., Disp: 30 tablet, Rfl: 0 .  Elastic Bandages & Supports (COMFORT FIT MATERNITY SUPP LG) MISC, 1 Units by Does not apply route daily., Disp: 1 each, Rfl: 0 .  lidocaine (LIDODERM) 5 %, Place 1 patch onto the skin daily. Remove & Discard patch within 12 hours or as directed by MD, Disp: 30 patch, Rfl: 0 .  Prenat w/o A Vit-FeFum-FePo-FA (CONCEPT OB) 130-92.4-1 MG CAPS, Take 1 capsule by mouth daily., Disp: 30 capsule, Rfl: 11 .  promethazine (PHENERGAN) 12.5 MG tablet, Take 12.5 mg by mouth every 6 (six) hours as needed., Disp: , Rfl:   PAST MEDICAL HISTORY: Past Medical History:  Diagnosis Date  . ADHD (attention deficit hyperactivity disorder)   . Bacterial vaginosis   . BRCA negative 10/2019   MyRisk neg  . Family history of breast cancer   . Increased risk of breast cancer 10/2019   IBIS=25.3%/riskscore=20.2%  . Yeast infection     PAST SURGICAL HISTORY: Past Surgical History:  Procedure Laterality Date  . CHOLECYSTECTOMY    . NO PAST SURGERIES      FAMILY HISTORY: Family History  Problem Relation Age of Onset  . Thyroid disease  Paternal Grandmother   . Ovarian cancer Paternal Grandmother 85  . Breast cancer Maternal Aunt 32  . Breast cancer Other 60  . Cervical cancer Paternal Aunt 91    SOCIAL HISTORY:  Social History   Socioeconomic History  . Marital status: Single    Spouse name: Not on file  . Number of children: Not on file  .  Years of education: Not on file  . Highest education level: Not on file  Occupational History  . Not on file  Tobacco Use  . Smoking status: Former Smoker    Packs/day: 0.50    Types: Cigarettes    Quit date: 03/24/2020    Years since quitting: 0.4  . Smokeless tobacco: Never Used  Vaping Use  . Vaping Use: Former  Substance and Sexual Activity  . Alcohol use: Not Currently  . Drug use: No  . Sexual activity: Not Currently    Birth control/protection: None  Other Topics Concern  . Not on file  Social History Narrative  . Not on file   Social Determinants of Health   Financial Resource Strain:   . Difficulty of Paying Living Expenses: Not on file  Food Insecurity:   . Worried About Charity fundraiser in the Last Year: Not on file  . Ran Out of Food in the Last Year: Not on file  Transportation Needs:   . Lack of Transportation (Medical): Not on file  . Lack of Transportation (Non-Medical): Not on file  Physical Activity:   . Days of Exercise per Week: Not on file  . Minutes of Exercise per Session: Not on file  Stress:   . Feeling of Stress : Not on file  Social Connections:   . Frequency of Communication with Friends and Family: Not on file  . Frequency of Social Gatherings with Friends and Family: Not on file  . Attends Religious Services: Not on file  . Active Member of Clubs or Organizations: Not on file  . Attends Archivist Meetings: Not on file  . Marital Status: Not on file  Intimate Partner Violence:   . Fear of Current or Ex-Partner: Not on file  . Emotionally Abused: Not on file  . Physically Abused: Not on file  . Sexually Abused: Not on file     PHYSICAL EXAM  Vitals:   09/13/20 1046  BP: 130/68  Pulse: (!) 108  Weight: 205 lb (93 kg)  Height: $Remove'5\' 3"'kfCySNJ$  (1.6 m)    Body mass index is 36.31 kg/m.   General: The patient is well-developed and well-nourished and in no acute distress  HEENT:  Head is [place]/AT.  Sclera are anicteric.   Funduscopic exam shows normal optic discs and retinal vessels.  Neck: No carotid bruits are noted.  The neck is nontender.  Cardiovascular: The heart has a regular rate and rhythm with a normal S1 and S2. There were no murmurs, gallops or rubs.    Skin: Extremities are without rash or  edema.   Neurologic Exam  Mental status: The patient is alert and oriented x 3 at the time of the examination. The patient has apparent normal recent and remote memory, with an apparently normal attention span and concentration ability.   Speech is normal.  Cranial nerves: Extraocular movements are full. Pupils are equal, round, and reactive to light and accomodation.  Visual fields are full.  Facial symmetry is present. There is good facial sensation to soft touch bilaterally.Facial strength is normal.  Trapezius and  sternocleidomastoid strength is normal. No dysarthria is noted.  The tongue is midline, and the patient has symmetric elevation of the soft palate. No obvious hearing deficits are noted.  Motor:  Muscle bulk is normal.   Tone is normal. Strength is  5 / 5 in all 4 extremities.   Sensory: Sensory testing is intact to pinprick, soft touch and vibration sensation in all 4 extremities.  Coordination: Cerebellar testing reveals good finger-nose-finger and heel-to-shin bilaterally.  Gait and station: Station is normal.   Gait is normal. Tandem gait is normal. Romberg is negative.   Reflexes: Deep tendon reflexes are symmetric and normal bilaterally.        DIAGNOSTIC DATA (LABS, IMAGING, TESTING) - I reviewed patient records, labs, notes, testing and imaging myself where available.  Lab Results  Component Value Date   WBC 15.3 (H) 09/11/2020   HGB 11.3 (L) 09/11/2020   HCT 34.0 (L) 09/11/2020   MCV 93.9 09/11/2020   PLT 152 09/11/2020      Component Value Date/Time   NA 136 09/11/2020 2242   NA 140 11/27/2014 2301   K 3.9 09/11/2020 2242   K 3.9 11/27/2014 2301   CL 108 09/11/2020  2242   CL 105 11/27/2014 2301   CO2 17 (L) 09/11/2020 2242   CO2 27 (H) 11/27/2014 2301   GLUCOSE 147 (H) 09/11/2020 2242   GLUCOSE 96 11/27/2014 2301   BUN <5 (L) 09/11/2020 2242   BUN 16 11/27/2014 2301   CREATININE 0.61 09/11/2020 2242   CREATININE 0.76 11/27/2014 2301   CALCIUM 9.1 09/11/2020 2242   CALCIUM 8.7 (L) 11/27/2014 2301   PROT 6.3 (L) 09/11/2020 2242   PROT 7.7 11/27/2014 2301   ALBUMIN 2.9 (L) 09/11/2020 2242   ALBUMIN 4.2 11/27/2014 2301   AST 19 09/11/2020 2242   AST 11 11/27/2014 2301   ALT 13 09/11/2020 2242   ALT 21 11/27/2014 2301   ALKPHOS 105 09/11/2020 2242   ALKPHOS 65 11/27/2014 2301   BILITOT 0.3 09/11/2020 2242   BILITOT 0.3 11/27/2014 2301   GFRNONAA >60 09/11/2020 2242   GFRAA >60 09/11/2020 2242    Lab Results  Component Value Date   TSH 0.693 10/28/2019       ASSESSMENT AND PLAN  Chronic migraine  Pregnancy with 37 weeks completed gestation  Chronic midline low back pain without sciatica  In summary, Ms. Solanki is a 24 year old woman who is currently [redacted] weeks pregnant who has had worsening migraine headaches over the last couple months. I will have her try Nurtec 75 mg. Although not much is known about the anti-CGRP agents during pregnancy, there have not been reports of worse outcomes. For prophylaxis, I will have her take Magnesium 400-500 mg daily and riboflavin 200 mg po twice a day. If not effective, will consider a beta-blocker or one of the anti-CGRP injections.  I will see her back as needed. She will call if migraines persist or she has new or worsening neurologic symptoms.  Thank you for asking me to see Ms. Linton Rump. Please let me know if I can be of further assistance with her or other patients in the future.   Devinn Voshell A. Felecia Shelling, MD, [facility] 8/85/0277, 41:28 PM Certified in Neurology, Clinical Neurophysiology, Sleep Medicine and Neuroimaging  [facility] [address] [place], [place]  [address] [phone]

## 2020-09-13 NOTE — Patient Instructions (Addendum)
MAGNESIUM OXIDE  400 to 500 mg daily  RIBOFLAVIN (B2)  200 mg twice a day

## 2020-09-30 ENCOUNTER — Other Ambulatory Visit: Payer: Self-pay

## 2020-09-30 ENCOUNTER — Inpatient Hospital Stay (HOSPITAL_COMMUNITY): Payer: Medicaid Other | Admitting: Anesthesiology

## 2020-09-30 ENCOUNTER — Inpatient Hospital Stay (HOSPITAL_COMMUNITY)
Admission: AD | Admit: 2020-09-30 | Discharge: 2020-10-04 | DRG: 787 | Disposition: A | Discharge status: Home / Self Care | Payer: Medicaid Other | Attending: Obstetrics & Gynecology | Admitting: Obstetrics & Gynecology

## 2020-09-30 ENCOUNTER — Encounter (HOSPITAL_COMMUNITY): Payer: Self-pay | Admitting: Obstetrics and Gynecology

## 2020-09-30 DIAGNOSIS — Z3A38 38 weeks gestation of pregnancy: Secondary | ICD-10-CM | POA: Diagnosis not present

## 2020-09-30 DIAGNOSIS — Z87891 Personal history of nicotine dependence: Secondary | ICD-10-CM | POA: Diagnosis not present

## 2020-09-30 DIAGNOSIS — D62 Acute posthemorrhagic anemia: Secondary | ICD-10-CM | POA: Diagnosis not present

## 2020-09-30 DIAGNOSIS — O134 Gestational [pregnancy-induced] hypertension without significant proteinuria, complicating childbirth: Secondary | ICD-10-CM | POA: Diagnosis present

## 2020-09-30 DIAGNOSIS — A6000 Herpesviral infection of urogenital system, unspecified: Secondary | ICD-10-CM | POA: Diagnosis present

## 2020-09-30 DIAGNOSIS — Z68.41 Body mass index (BMI) pediatric, greater than or equal to 95th percentile for age: Secondary | ICD-10-CM

## 2020-09-30 DIAGNOSIS — O323XX0 Maternal care for face, brow and chin presentation, not applicable or unspecified: Secondary | ICD-10-CM | POA: Diagnosis present

## 2020-09-30 DIAGNOSIS — O9081 Anemia of the puerperium: Secondary | ICD-10-CM | POA: Diagnosis not present

## 2020-09-30 DIAGNOSIS — IMO0002 Reserved for concepts with insufficient information to code with codable children: Secondary | ICD-10-CM

## 2020-09-30 DIAGNOSIS — O9902 Anemia complicating childbirth: Secondary | ICD-10-CM | POA: Diagnosis present

## 2020-09-30 DIAGNOSIS — O4292 Full-term premature rupture of membranes, unspecified as to length of time between rupture and onset of labor: Secondary | ICD-10-CM | POA: Diagnosis present

## 2020-09-30 DIAGNOSIS — O139 Gestational [pregnancy-induced] hypertension without significant proteinuria, unspecified trimester: Secondary | ICD-10-CM | POA: Diagnosis present

## 2020-09-30 DIAGNOSIS — O9832 Other infections with a predominantly sexual mode of transmission complicating childbirth: Secondary | ICD-10-CM | POA: Diagnosis present

## 2020-09-30 DIAGNOSIS — Z20822 Contact with and (suspected) exposure to covid-19: Secondary | ICD-10-CM | POA: Diagnosis present

## 2020-09-30 DIAGNOSIS — Z23 Encounter for immunization: Secondary | ICD-10-CM

## 2020-09-30 DIAGNOSIS — F988 Other specified behavioral and emotional disorders with onset usually occurring in childhood and adolescence: Secondary | ICD-10-CM | POA: Diagnosis present

## 2020-09-30 DIAGNOSIS — Z3490 Encounter for supervision of normal pregnancy, unspecified, unspecified trimester: Secondary | ICD-10-CM

## 2020-09-30 LAB — URINALYSIS, ROUTINE W REFLEX MICROSCOPIC
Bilirubin Urine: NEGATIVE
Glucose, UA: NEGATIVE mg/dL
Hgb urine dipstick: NEGATIVE
Ketones, ur: NEGATIVE mg/dL
Leukocytes,Ua: NEGATIVE
Nitrite: NEGATIVE
Protein, ur: NEGATIVE mg/dL
Specific Gravity, Urine: 1.012 (ref 1.005–1.030)
pH: 6 (ref 5.0–8.0)

## 2020-09-30 LAB — CBC
HCT: 35.3 % — ABNORMAL LOW (ref 36.0–46.0)
HCT: 34.9 % — ABNORMAL LOW (ref 36.0–46.0)
Hemoglobin: 11.6 g/dL — ABNORMAL LOW (ref 12.0–15.0)
Hemoglobin: 11.4 g/dL — ABNORMAL LOW (ref 12.0–15.0)
MCH: 30.3 pg (ref 26.0–34.0)
MCH: 30 pg (ref 26.0–34.0)
MCHC: 32.9 g/dL (ref 30.0–36.0)
MCHC: 32.7 g/dL (ref 30.0–36.0)
MCV: 92.2 fL (ref 80.0–100.0)
MCV: 91.8 fL (ref 80.0–100.0)
Platelets: 170 10*3/uL (ref 150–400)
Platelets: 151 10*3/uL (ref 150–400)
RBC: 3.83 MIL/uL — ABNORMAL LOW (ref 3.87–5.11)
RBC: 3.8 MIL/uL — ABNORMAL LOW (ref 3.87–5.11)
RDW: 13.9 % (ref 11.5–15.5)
RDW: 13.9 % (ref 11.5–15.5)
WBC: 16.2 10*3/uL — ABNORMAL HIGH (ref 4.0–10.5)
WBC: 16.6 10*3/uL — ABNORMAL HIGH (ref 4.0–10.5)
nRBC: 0 % (ref 0.0–0.2)
nRBC: 0 % (ref 0.0–0.2)

## 2020-09-30 LAB — COMPREHENSIVE METABOLIC PANEL
ALT: 17 U/L (ref 0–44)
AST: 19 U/L (ref 15–41)
Albumin: 2.9 g/dL — ABNORMAL LOW (ref 3.5–5.0)
Alkaline Phosphatase: 121 U/L (ref 38–126)
Anion gap: 10 (ref 5–15)
BUN: 11 mg/dL (ref 6–20)
CO2: 19 mmol/L — ABNORMAL LOW (ref 22–32)
Calcium: 9.3 mg/dL (ref 8.9–10.3)
Chloride: 105 mmol/L (ref 98–111)
Creatinine, Ser: 0.55 mg/dL (ref 0.44–1.00)
GFR calc non Af Amer: >60 mL/min (ref >60)
Glucose, Bld: 122 mg/dL — ABNORMAL HIGH (ref 70–99)
Potassium: 3.8 mmol/L (ref 3.5–5.1)
Sodium: 134 mmol/L — ABNORMAL LOW (ref 135–145)
Total Bilirubin: 0.3 mg/dL (ref 0.3–1.2)
Total Protein: 6.3 g/dL — ABNORMAL LOW (ref 6.5–8.1)

## 2020-09-30 LAB — TYPE AND SCREEN
ABO/RH(D): A POS
Antibody Screen: NEGATIVE

## 2020-09-30 LAB — RESPIRATORY PANEL BY RT PCR (FLU A&B, COVID)
Influenza A by PCR: NEGATIVE
Influenza B by PCR: NEGATIVE
SARS Coronavirus 2 by RT PCR: NEGATIVE

## 2020-09-30 LAB — PROTEIN / CREATININE RATIO, URINE
Creatinine, Urine: 53.99 mg/dL
Protein Creatinine Ratio: 0.13 mg/mg{Cre} (ref 0.00–0.15)
Total Protein, Urine: 7 mg/dL

## 2020-09-30 MED ORDER — OXYTOCIN-SODIUM CHLORIDE 30-0.9 UT/500ML-% IV SOLN: 2.5000 [IU]/h | INTRAVENOUS | Status: DC

## 2020-09-30 MED ORDER — FENTANYL-BUPIVACAINE-NACL 0.5-0.125-0.9 MG/250ML-% EP SOLN
12.0000 mL/h | EPIDURAL | Status: DC | PRN
  Administered 2020-10-01: 12 mL/h via EPIDURAL
  Filled 2020-09-30 (×2): qty 250

## 2020-09-30 MED ORDER — ACETAMINOPHEN 500 MG PO TABS
1000.0000 mg | ORAL_TABLET | ORAL | Status: DC | PRN
  Administered 2020-09-30: 1000 mg via ORAL
  Filled 2020-09-30 (×2): qty 2

## 2020-09-30 MED ORDER — SODIUM CHLORIDE 0.9% FLUSH: 3.0000 mL | Freq: Two times a day (BID) | INTRAVENOUS | Status: DC

## 2020-09-30 MED ORDER — SODIUM CHLORIDE 0.9 % IV SOLN: 250.0000 mL | INTRAVENOUS | Status: DC | PRN

## 2020-09-30 MED ORDER — OXYTOCIN BOLUS FROM INFUSION: 333.0000 mL | Freq: Once | INTRAVENOUS | Status: DC

## 2020-09-30 MED ORDER — PHENYLEPHRINE 40 MCG/ML (10ML) SYRINGE FOR IV PUSH (FOR BLOOD PRESSURE SUPPORT)
80.0000 ug | PREFILLED_SYRINGE | INTRAVENOUS | Status: DC | PRN
  Filled 2020-09-30: qty 10

## 2020-09-30 MED ORDER — LACTATED RINGERS IV SOLN
500.0000 mL | INTRAVENOUS | Status: DC | PRN
  Administered 2020-10-01: 300 mL via INTRAVENOUS

## 2020-09-30 MED ORDER — FENTANYL CITRATE (PF) 100 MCG/2ML IJ SOLN
50.0000 ug | Freq: Once | INTRAMUSCULAR | Status: AC
  Administered 2020-09-30: 50 ug via INTRAVENOUS
  Filled 2020-09-30: qty 2

## 2020-09-30 MED ORDER — ACETAMINOPHEN 325 MG PO TABS: 650.0000 mg | ORAL_TABLET | ORAL | Status: DC | PRN

## 2020-09-30 MED ORDER — EPHEDRINE 5 MG/ML INJ: 10.0000 mg | INTRAVENOUS | Status: DC | PRN

## 2020-09-30 MED ORDER — BUTORPHANOL TARTRATE 1 MG/ML IJ SOLN
1.0000 mg | INTRAMUSCULAR | Status: DC | PRN
  Administered 2020-09-30: 1 mg via INTRAVENOUS
  Filled 2020-09-30: qty 1

## 2020-09-30 MED ORDER — LACTATED RINGERS IV SOLN
500.0000 mL | Freq: Once | INTRAVENOUS | Status: AC
  Administered 2020-09-30: 500 mL via INTRAVENOUS

## 2020-09-30 MED ORDER — SODIUM CHLORIDE 0.9% FLUSH: 3.0000 mL | INTRAVENOUS | Status: DC | PRN

## 2020-09-30 MED ORDER — TERBUTALINE SULFATE 1 MG/ML IJ SOLN: 0.2500 mg | Freq: Once | INTRAMUSCULAR | Status: DC | PRN

## 2020-09-30 MED ORDER — ONDANSETRON HCL 4 MG/2ML IJ SOLN
4.0000 mg | Freq: Four times a day (QID) | INTRAMUSCULAR | Status: DC | PRN
  Administered 2020-09-30 – 2020-10-01 (×2): 4 mg via INTRAVENOUS
  Filled 2020-09-30 (×3): qty 2

## 2020-09-30 MED ORDER — OXYTOCIN-SODIUM CHLORIDE 30-0.9 UT/500ML-% IV SOLN
1.0000 m[IU]/min | INTRAVENOUS | Status: DC
  Administered 2020-09-30: 2 m[IU]/min via INTRAVENOUS
  Filled 2020-09-30: qty 500

## 2020-09-30 MED ORDER — SOD CITRATE-CITRIC ACID 500-334 MG/5ML PO SOLN
30.0000 mL | ORAL | Status: DC | PRN
  Administered 2020-10-01: 30 mL via ORAL
  Filled 2020-09-30: qty 15

## 2020-09-30 MED ORDER — PHENYLEPHRINE 40 MCG/ML (10ML) SYRINGE FOR IV PUSH (FOR BLOOD PRESSURE SUPPORT): 80.0000 ug | PREFILLED_SYRINGE | INTRAVENOUS | Status: DC | PRN

## 2020-09-30 MED ORDER — CYCLOBENZAPRINE HCL 10 MG PO TABS: 10.0000 mg | ORAL_TABLET | Freq: Three times a day (TID) | ORAL | Status: DC | PRN

## 2020-09-30 MED ORDER — BUPIVACAINE HCL (PF) 0.75 % IJ SOLN
INTRAMUSCULAR | Status: DC | PRN
Start: 2020-09-30 — End: 2020-10-01
  Administered 2020-09-30: 12 mL/h via EPIDURAL

## 2020-09-30 MED ORDER — DIPHENHYDRAMINE HCL 50 MG/ML IJ SOLN: 12.5000 mg | INTRAMUSCULAR | Status: DC | PRN

## 2020-09-30 MED ORDER — LIDOCAINE HCL (PF) 1 % IJ SOLN: 30.0000 mL | INTRAMUSCULAR | Status: DC | PRN

## 2020-09-30 MED ORDER — LACTATED RINGERS IV SOLN: INTRAVENOUS | Status: DC

## 2020-09-30 MED ORDER — LIDOCAINE HCL (PF) 1 % IJ SOLN
INTRAMUSCULAR | Status: DC | PRN
  Administered 2020-09-30 (×2): 6 mL via EPIDURAL

## 2020-09-30 MED ORDER — LIDOCAINE 5 % EX PTCH
1.0000 | MEDICATED_PATCH | CUTANEOUS | Status: DC
  Administered 2020-09-30: 1 via TRANSDERMAL
  Filled 2020-09-30 (×2): qty 1

## 2020-09-30 NOTE — Progress Notes (Signed)
S: Comfortable with epidural. Family at the bedside.   O: Vitals:   09/30/20 1700 09/30/20 1730 09/30/20 1731 09/30/20 1800  BP: 127/80 125/80  114/66  Pulse: (!) 108  (!) 107 (!) 105  Resp:      Temp: 98.5 F (36.9 C)     TempSrc: Oral     SpO2:      Weight:      Height:       FHT:  FHR: 135 bpm, variability: moderate,  accelerations:  Present,  decelerations:  Absent  UC:   irregular, every 1.5-3, MVUs 190-225 SVE:   Dilation: 6-7 Effacement (%): 70 Station: -2 Exam by: Janeice Robinson, RN  A / P: Induction of labor due to gestational hypertension, s/p foley balloon, Pitocin infusing  Fetal Wellbeing:  Category I Pain Control:  Epidural  Preeclampsia: PEC labs WNL, mild range BP, asymptomatic I/D: Afebrile Anticipated MOD:  NSVD   Continue to increase Pitocin to adequate MVUs.   Dr. Connye Burkitt updated on plan of care.    June Leap, CNM, MSN 09/30/2020, 6:19 PM   MD Addendum: I reviewed chart and agree with above findings, assessment and plan as outlined above by CNM  June Leap, CNM, MSN. EFM now shows category 1 tracing.  Dr. Sallye Ober. 09/30/20. 1715.
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
## 2020-09-30 NOTE — MAU Note (Addendum)
Ctxs since 12n THurs but closer since 2300. Denies LOF or VB. Membranes stripped in office yesterday. SVE 1cm. Having a lot of pelvic pressure and lower back pain

## 2020-09-30 NOTE — Anesthesia Preprocedure Evaluation (Addendum)
Anesthesia Evaluation  Patient identified by MRN, date of birth, ID band Patient awake    Reviewed: Allergy & Precautions, NPO status , Patient's Chart, lab work & pertinent test results  Airway Mallampati: I       Dental no notable dental hx.    Pulmonary former smoker,    Pulmonary exam normal        Cardiovascular hypertension, Normal cardiovascular exam     Neuro/Psych  Headaches,    GI/Hepatic negative GI ROS, Neg liver ROS,   Endo/Other  negative endocrine ROS  Renal/GU negative Renal ROS  negative genitourinary   Musculoskeletal negative musculoskeletal ROS (+)   Abdominal Normal abdominal exam  (+)   Peds  Hematology  (+) anemia ,   Anesthesia Other Findings   Reproductive/Obstetrics                             Anesthesia Physical Anesthesia Plan  ASA: II  Anesthesia Plan: Epidural   Post-op Pain Management:    Induction:   PONV Risk Score and Plan:   Airway Management Planned:   Additional Equipment:   Intra-op Plan:   Post-operative Plan:   Informed Consent: I have reviewed the patients History and Physical, chart, labs and discussed the procedure including the risks, benefits and alternatives for the proposed anesthesia with the patient or authorized representative who has indicated his/her understanding and acceptance.       Plan Discussed with:   Anesthesia Plan Comments:         Anesthesia Quick Evaluation

## 2020-09-30 NOTE — Progress Notes (Signed)
LD charge called-report given-pt to go to bed 202 after orders recieved

## 2020-09-30 NOTE — MAU Provider Note (Signed)
Chief Complaint:  Contractions   First Provider Initiated Contact with Patient 09/30/20 0441     HPI: Michele Fields is a 24 y.o. G2P0010 at 34w6dho presents to maternity admissions reporting painful contractions.  While being assessed for labor, BP was noted to be elevated. . She reports good fetal movement, denies LOF, vaginal bleeding, vaginal itching/burning, urinary symptoms, h/a, dizziness, n/v, diarrhea, constipation or fever/chills.  She denies headache, visual changes or RUQ abdominal pain.  Abdominal Pain This is a new problem. The current episode started today. The onset quality is gradual. The problem occurs intermittently. The problem has been unchanged. The quality of the pain is cramping. The abdominal pain does not radiate. Pertinent negatives include no constipation, diarrhea, dysuria, fever, frequency, headaches, nausea or vomiting. Nothing aggravates the pain. The pain is relieved by nothing. She has tried nothing for the symptoms.    RN Note: Ctxs since 12n THurs but closer since 2300. Denies LOF or VB. Membranes stripped in office yesterday. SVE 1cm. Having a lot of pelvic pressure and lower back pain  Past Medical History: Past Medical History:  Diagnosis Date  . ADHD (attention deficit hyperactivity disorder)   . Bacterial vaginosis   . BRCA negative 10/2019   MyRisk neg  . Family history of breast cancer   . Increased risk of breast cancer 10/2019   IBIS=25.3%/riskscore=20.2%  . Yeast infection     Past obstetric history: OB History  Gravida Para Term Preterm AB Living  2 0 0 0 1 0  SAB TAB Ectopic Multiple Live Births  1 0 0 0 0    # Outcome Date GA Lbr Len/2nd Weight Sex Delivery Anes PTL Lv  2 Current           1 SAB 01/2019            Past Surgical History: Past Surgical History:  Procedure Laterality Date  . CHOLECYSTECTOMY    . NO PAST SURGERIES      Family History: Family History  Problem Relation Age of Onset  . Thyroid disease Paternal  Grandmother   . Ovarian cancer Paternal Grandmother 272 . Breast cancer Maternal Aunt 32  . Breast cancer Other 679 . Cervical cancer Paternal Aunt 229   Social History: Social History   Tobacco Use  . Smoking status: Former Smoker    Packs/day: 0.50    Types: Cigarettes    Quit date: 03/24/2020    Years since quitting: 0.5  . Smokeless tobacco: Never Used  Vaping Use  . Vaping Use: Former  Substance Use Topics  . Alcohol use: Not Currently  . Drug use: No    Allergies:  Allergies  Allergen Reactions  . Other Other (See Comments)    Seasonal Allergies    Meds:  Medications Prior to Admission  Medication Sig Dispense Refill Last Dose  . lidocaine (LIDODERM) 5 % Place 1 patch onto the skin daily. Remove & Discard patch within 12 hours or as directed by MD 30 patch 0 09/29/2020 at Unknown time  . cyclobenzaprine (FLEXERIL) 10 MG tablet Take 1 tablet (10 mg total) by mouth 3 (three) times daily as needed (back pain). 30 tablet 0 09/29/2020  . Elastic Bandages & Supports (COMFORT FIT MATERNITY SUPP LG) MISC 1 Units by Does not apply route daily. 1 each 0   . Prenat w/o A Vit-FeFum-FePo-FA (CONCEPT OB) 130-92.4-1 MG CAPS Take 1 capsule by mouth daily. 30 capsule 11 09/29/2020  . promethazine (PHENERGAN) 12.5 MG  tablet Take 12.5 mg by mouth every 6 (six) hours as needed.   Unknown at Unknown time    I have reviewed patient's Past Medical Hx, Surgical Hx, Family Hx, Social Hx, medications and allergies.   ROS:  Review of Systems  Constitutional: Negative for fever.  Gastrointestinal: Positive for abdominal pain. Negative for constipation, diarrhea, nausea and vomiting.  Genitourinary: Negative for dysuria and frequency.  Neurological: Negative for headaches.   Other systems negative  Physical Exam   Patient Vitals for the past 24 hrs:  BP Temp Pulse Resp Height Weight  09/30/20 0437 (!) 145/101 -- (!) 113 -- -- --  09/30/20 0418 130/90 -- (!) 112 -- -- --  09/30/20 0413  -- 98.3 F (36.8 C) -- 18 5' 3" (1.6 m) 95.7 kg   Vitals:   09/30/20 0545 09/30/20 0546 09/30/20 0601 09/30/20 0616  BP:  128/85 (!) 120/98 (!) 139/97  Pulse:  (!) 111 (!) 121 (!) 120  Resp:      Temp:      SpO2: 100%     Weight:      Height:       Vitals:   09/30/20 0630 09/30/20 0654 09/30/20 0700 09/30/20 0715  BP: (!) 144/110 (!) 136/99 131/89 118/76  Pulse: (!) 114 (!) 118 (!) 122 (!) 108  Resp:      Temp:      SpO2: 100%   100%  Weight:      Height:       Constitutional: Well-developed, well-nourished female in no acute distress.  Cardiovascular: normal rate and rhythm Respiratory: normal effort, clear to auscultation bilaterally GI: Abd soft, non-tender, gravid appropriate for gestational age.   No rebound or guarding. MS: Extremities nontender, no edema, normal ROM Neurologic: Alert and oriented x 4. DTRs 3+ GU: Neg CVAT.  PELVIC EXAM:  Dilation: 1 Effacement (%): 50 Station: -3 Presentation: Vertex Exam by:: fisher,rn  FHT:  Baseline 135 , moderate variability, accelerations present, no decelerations Contractions: q 2-4 mins Irregular    Labs: Results for orders placed or performed during the hospital encounter of 09/30/20 (from the past 24 hour(s))  Urinalysis, Routine w reflex microscopic Urine, Clean Catch     Status: Abnormal   Collection Time: 09/30/20  4:27 AM  Result Value Ref Range   Color, Urine YELLOW YELLOW   APPearance HAZY (A) CLEAR   Specific Gravity, Urine 1.012 1.005 - 1.030   pH 6.0 5.0 - 8.0   Glucose, UA NEGATIVE NEGATIVE mg/dL   Hgb urine dipstick NEGATIVE NEGATIVE   Bilirubin Urine NEGATIVE NEGATIVE   Ketones, ur NEGATIVE NEGATIVE mg/dL   Protein, ur NEGATIVE NEGATIVE mg/dL   Nitrite NEGATIVE NEGATIVE   Leukocytes,Ua NEGATIVE NEGATIVE  Protein / creatinine ratio, urine     Status: None   Collection Time: 09/30/20  4:27 AM  Result Value Ref Range   Creatinine, Urine 53.99 mg/dL   Total Protein, Urine 7 mg/dL   Protein  Creatinine Ratio 0.13 0.00 - 0.15 mg/mg[Cre]  CBC     Status: Abnormal   Collection Time: 09/30/20  4:43 AM  Result Value Ref Range   WBC 16.2 (H) 4.0 - 10.5 K/uL   RBC 3.83 (L) 3.87 - 5.11 MIL/uL   Hemoglobin 11.6 (L) 12.0 - 15.0 g/dL   HCT 35.3 (L) 36 - 46 %   MCV 92.2 80.0 - 100.0 fL   MCH 30.3 26.0 - 34.0 pg   MCHC 32.9 30.0 - 36.0 g/dL   RDW  13.9 11.5 - 15.5 %   Platelets 170 150 - 400 K/uL   nRBC 0.0 0.0 - 0.2 %  Comprehensive metabolic panel     Status: Abnormal   Collection Time: 09/30/20  4:43 AM  Result Value Ref Range   Sodium 134 (L) 135 - 145 mmol/L   Potassium 3.8 3.5 - 5.1 mmol/L   Chloride 105 98 - 111 mmol/L   CO2 19 (L) 22 - 32 mmol/L   Glucose, Bld 122 (H) 70 - 99 mg/dL   BUN 11 6 - 20 mg/dL   Creatinine, Ser 0.55 0.44 - 1.00 mg/dL   Calcium 9.3 8.9 - 10.3 mg/dL   Total Protein 6.3 (L) 6.5 - 8.1 g/dL   Albumin 2.9 (L) 3.5 - 5.0 g/dL   AST 19 15 - 41 U/L   ALT 17 0 - 44 U/L   Alkaline Phosphatase 121 38 - 126 U/L   Total Bilirubin 0.3 0.3 - 1.2 mg/dL   GFR calc non Af Amer >60 >60 mL/min   Anion gap 10 5 - 15    A/Positive/-- (03/08 1054)  Imaging:  No results found.  MAU Course/MDM: I have ordered labs and reviewed results. Labs are within normal limits.  BPs are elevated NST reviewed Consult Dr Roselie Awkward who recommends admission for Gestational Hypertension.  Consult Dr Simona Huh (on call for [place]) with presentation, exam findings and test results. She will discuss with [facility] CNM re: admission for Gestational HTN/IOL vs discharge home   They will consult [facility] doctor at 7am  Treatments in MAU included EFM.    Assessment: Single IUP at 70w6dGestational Hypertension   Plan: Plan per [facility] provider   MHansel FeinsteinCNM, MSN Certified Nurse-Midwife 09/30/2020 4:42 AM

## 2020-09-30 NOTE — Progress Notes (Addendum)
S: Comfortable with epidural. Family at the bedside. Discussed the R/B/A of AROM and IUPC placement. Pt consents to procedures.   O: Vitals:   09/30/20 1300 09/30/20 1305 09/30/20 1310 09/30/20 1315  BP: (!) 139/93 136/89 140/86 135/84  Pulse: (!) 113 (!) 107 (!) 110 (!) 110  Resp:   (!) 22   Temp:      TempSrc:      SpO2: 100% 99% 99% 98%  Weight:      Height:       FHT:  FHR: 135 bpm, variability: moderate,  accelerations:  Present,  decelerations:  Present prolonged decel to 90s at 1320 for 5 minutes following epidural with to baseline of 135. Occasional variable decelerations. + scalp stim UC:   irregular, every 2-5 minutes SVE:   Dilation: 4 Effacement (%): 50 Station: -2 Exam by:: Jones, CNM   AROM of copious amount of clear fluid @ 1335  IUPC placed without difficulty.   A / P: Induction of labor due to gestational hypertension, s/p foley balloon, Pitocin infusing  Fetal Wellbeing:  Category II Pain Control:  Epidural  Preeclampsia: PEC labs WNL, mild range BP, asymptomatic I/D: Transient maternal tachycardia with last temp 99.7, Tylenol 1000mg  PO ordered Anticipated MOD:  NSVD   Will restart Pitocin at 60mu in 30 minutes and increase until adequate MVUs.   3m, CNM, MSN 09/30/2020, 1:41 PM   MD Addendum: I reviewed chart and agree with above findings, assessment and plan as outlined above by CNM  11/30/2020, CNM, MSN. EFM now shows category 1 tracing.  Dr. June Leap. 09/30/20. 1715.

## 2020-09-30 NOTE — Progress Notes (Signed)
Pt to wheelchair-discussed plan of care with pt-pt agreeable with admission and voices understanding or reasoning for induction due to hypertension. Pt transferred in stable condition without current CNS complaints of headache, visual changes or epigastric pain. FHR reactive and reassuring.

## 2020-09-30 NOTE — Anesthesia Procedure Notes (Signed)
Epidural Patient location during procedure: OB Start time: 09/30/2020 12:45 PM End time: 09/30/2020 12:49 PM  Staffing Anesthesiologist: Leilani Able, MD Performed: anesthesiologist   Preanesthetic Checklist Completed: patient identified, IV checked, site marked, risks and benefits discussed, surgical consent, monitors and equipment checked, pre-op evaluation and timeout performed  Epidural Patient position: sitting Prep: DuraPrep and site prepped and draped Patient monitoring: continuous pulse ox and blood pressure Approach: midline Location: L3-L4 Injection technique: LOR air  Needle:  Needle type: Tuohy  Needle gauge: 17 G Needle length: 9 cm and 9 Needle insertion depth: 5 cm cm Catheter type: closed end flexible Catheter size: 19 Gauge Catheter at skin depth: 12 cm Test dose: negative and Other  Assessment Events: blood not aspirated, injection not painful, no injection resistance, no paresthesia and negative IV test  Additional Notes Reason for block:procedure for pain

## 2020-09-30 NOTE — Progress Notes (Signed)
Jefferey Pica -called orders requested for admission

## 2020-10-01 ENCOUNTER — Encounter (HOSPITAL_COMMUNITY)
Admission: AD | Disposition: A | Discharge status: Home / Self Care | Payer: Self-pay | Source: Home / Self Care | Attending: Obstetrics & Gynecology

## 2020-10-01 ENCOUNTER — Encounter (HOSPITAL_COMMUNITY): Payer: Self-pay | Admitting: *Deleted

## 2020-10-01 LAB — CBC
HCT: 32.8 % — ABNORMAL LOW (ref 36.0–46.0)
Hemoglobin: 10.8 g/dL — ABNORMAL LOW (ref 12.0–15.0)
MCH: 30 pg (ref 26.0–34.0)
MCHC: 32.9 g/dL (ref 30.0–36.0)
MCV: 91.1 fL (ref 80.0–100.0)
Platelets: 156 10*3/uL (ref 150–400)
RBC: 3.6 MIL/uL — ABNORMAL LOW (ref 3.87–5.11)
RDW: 13.7 % (ref 11.5–15.5)
WBC: 26.5 10*3/uL — ABNORMAL HIGH (ref 4.0–10.5)
nRBC: 0 % (ref 0.0–0.2)

## 2020-10-01 LAB — RPR: RPR Ser Ql: NONREACTIVE

## 2020-10-01 SURGERY — Surgical Case
Anesthesia: Epidural

## 2020-10-01 MED ORDER — LACTATED RINGERS IV SOLN: INTRAVENOUS | Status: DC

## 2020-10-01 MED ORDER — DIBUCAINE (PERIANAL) 1 % EX OINT: 1.0000 "application " | TOPICAL_OINTMENT | CUTANEOUS | Status: DC | PRN

## 2020-10-01 MED ORDER — KETOROLAC TROMETHAMINE 30 MG/ML IJ SOLN
30.0000 mg | Freq: Four times a day (QID) | INTRAMUSCULAR | Status: AC | PRN
  Administered 2020-10-01 – 2020-10-02 (×3): 30 mg via INTRAVENOUS
  Filled 2020-10-01 (×2): qty 1

## 2020-10-01 MED ORDER — ONDANSETRON HCL 4 MG/2ML IJ SOLN
INTRAMUSCULAR | Status: AC
  Filled 2020-10-01: qty 2

## 2020-10-01 MED ORDER — KETOROLAC TROMETHAMINE 30 MG/ML IJ SOLN
30.0000 mg | Freq: Four times a day (QID) | INTRAMUSCULAR | Status: AC | PRN
  Filled 2020-10-01: qty 1

## 2020-10-01 MED ORDER — PRENATAL MULTIVITAMIN CH
1.0000 | ORAL_TABLET | Freq: Every day | ORAL | Status: DC
  Administered 2020-10-02 – 2020-10-03 (×2): 1 via ORAL
  Filled 2020-10-01 (×2): qty 1

## 2020-10-01 MED ORDER — NALOXONE HCL 4 MG/10ML IJ SOLN
1.0000 ug/kg/h | INTRAVENOUS | Status: DC | PRN
  Filled 2020-10-01: qty 5

## 2020-10-01 MED ORDER — MENTHOL 3 MG MT LOZG: 1.0000 | LOZENGE | OROMUCOSAL | Status: DC | PRN

## 2020-10-01 MED ORDER — LACTATED RINGERS IV SOLN: INTRAVENOUS | Status: DC | PRN

## 2020-10-01 MED ORDER — ACETAMINOPHEN 500 MG PO TABS
1000.0000 mg | ORAL_TABLET | Freq: Four times a day (QID) | ORAL | Status: AC
  Administered 2020-10-01 – 2020-10-02 (×3): 1000 mg via ORAL
  Filled 2020-10-01 (×3): qty 2

## 2020-10-01 MED ORDER — AMISULPRIDE (ANTIEMETIC) 5 MG/2ML IV SOLN
10.0000 mg | Freq: Once | INTRAVENOUS | Status: AC
  Administered 2020-10-01: 10 mg via INTRAVENOUS
  Filled 2020-10-01 (×2): qty 4

## 2020-10-01 MED ORDER — DIPHENHYDRAMINE HCL 25 MG PO CAPS: 25.0000 mg | ORAL_CAPSULE | ORAL | Status: DC | PRN

## 2020-10-01 MED ORDER — SIMETHICONE 80 MG PO CHEW: 80.0000 mg | CHEWABLE_TABLET | ORAL | Status: DC | PRN

## 2020-10-01 MED ORDER — SIMETHICONE 80 MG PO CHEW
80.0000 mg | CHEWABLE_TABLET | Freq: Three times a day (TID) | ORAL | Status: DC
  Administered 2020-10-02 – 2020-10-03 (×5): 80 mg via ORAL
  Filled 2020-10-01 (×6): qty 1

## 2020-10-01 MED ORDER — NALBUPHINE HCL 10 MG/ML IJ SOLN: 5.0000 mg | Freq: Once | INTRAMUSCULAR | Status: DC | PRN

## 2020-10-01 MED ORDER — KETOROLAC TROMETHAMINE 30 MG/ML IJ SOLN
30.0000 mg | Freq: Once | INTRAMUSCULAR | Status: AC | PRN
  Administered 2020-10-01: 30 mg via INTRAVENOUS

## 2020-10-01 MED ORDER — ZOLPIDEM TARTRATE 5 MG PO TABS: 5.0000 mg | ORAL_TABLET | Freq: Every evening | ORAL | Status: DC | PRN

## 2020-10-01 MED ORDER — ONDANSETRON HCL 4 MG/2ML IJ SOLN
INTRAMUSCULAR | Status: DC | PRN
  Administered 2020-10-01: 4 mg via INTRAVENOUS

## 2020-10-01 MED ORDER — SENNOSIDES-DOCUSATE SODIUM 8.6-50 MG PO TABS
2.0000 | ORAL_TABLET | ORAL | Status: DC
  Administered 2020-10-01 – 2020-10-03 (×3): 2 via ORAL
  Filled 2020-10-01 (×3): qty 2

## 2020-10-01 MED ORDER — OXYTOCIN-SODIUM CHLORIDE 30-0.9 UT/500ML-% IV SOLN
INTRAVENOUS | Status: DC | PRN
  Administered 2020-10-01: 400 mL via INTRAVENOUS

## 2020-10-01 MED ORDER — COCONUT OIL OIL: 1.0000 "application " | TOPICAL_OIL | Status: DC | PRN

## 2020-10-01 MED ORDER — CARBOPROST TROMETHAMINE 250 MCG/ML IM SOLN
INTRAMUSCULAR | Status: DC | PRN
  Administered 2020-10-01: 250 ug via INTRAMUSCULAR

## 2020-10-01 MED ORDER — OXYTOCIN-SODIUM CHLORIDE 30-0.9 UT/500ML-% IV SOLN: 2.5000 [IU]/h | INTRAVENOUS | Status: AC

## 2020-10-01 MED ORDER — DIPHENHYDRAMINE HCL 50 MG/ML IJ SOLN
12.5000 mg | INTRAMUSCULAR | Status: DC | PRN
  Administered 2020-10-02: 12.5 mg via INTRAVENOUS
  Filled 2020-10-01: qty 1

## 2020-10-01 MED ORDER — SODIUM CHLORIDE 0.9 % IV SOLN
INTRAVENOUS | Status: AC
  Filled 2020-10-01: qty 500

## 2020-10-01 MED ORDER — DIPHENOXYLATE-ATROPINE 2.5-0.025 MG PO TABS
ORAL_TABLET | ORAL | Status: AC
  Filled 2020-10-01: qty 1

## 2020-10-01 MED ORDER — SIMETHICONE 80 MG PO CHEW
80.0000 mg | CHEWABLE_TABLET | ORAL | Status: DC
  Administered 2020-10-01 – 2020-10-03 (×3): 80 mg via ORAL
  Filled 2020-10-01 (×3): qty 1

## 2020-10-01 MED ORDER — OXYTOCIN-SODIUM CHLORIDE 30-0.9 UT/500ML-% IV SOLN
INTRAVENOUS | Status: AC
  Filled 2020-10-01: qty 500

## 2020-10-01 MED ORDER — ACETAMINOPHEN 500 MG PO TABS
1000.0000 mg | ORAL_TABLET | Freq: Once | ORAL | Status: AC
  Administered 2020-10-01: 1000 mg via ORAL

## 2020-10-01 MED ORDER — ACETAMINOPHEN 10 MG/ML IV SOLN: 1000.0000 mg | Freq: Once | INTRAVENOUS | Status: DC | PRN

## 2020-10-01 MED ORDER — SODIUM CHLORIDE 0.9 % IR SOLN
Status: DC | PRN
  Administered 2020-10-01: 1

## 2020-10-01 MED ORDER — DIPHENOXYLATE-ATROPINE 2.5-0.025 MG PO TABS
1.0000 | ORAL_TABLET | Freq: Once | ORAL | Status: AC
  Administered 2020-10-01: 1 via ORAL
  Filled 2020-10-01: qty 1

## 2020-10-01 MED ORDER — NALBUPHINE HCL 10 MG/ML IJ SOLN: 5.0000 mg | INTRAMUSCULAR | Status: DC | PRN

## 2020-10-01 MED ORDER — STERILE WATER FOR IRRIGATION IR SOLN
Status: DC | PRN
  Administered 2020-10-01: 1

## 2020-10-01 MED ORDER — WITCH HAZEL-GLYCERIN EX PADS: 1.0000 "application " | MEDICATED_PAD | CUTANEOUS | Status: DC | PRN

## 2020-10-01 MED ORDER — ONDANSETRON HCL 4 MG/2ML IJ SOLN: 4.0000 mg | Freq: Three times a day (TID) | INTRAMUSCULAR | Status: DC | PRN

## 2020-10-01 MED ORDER — SCOPOLAMINE 1 MG/3DAYS TD PT72: 1.0000 | MEDICATED_PATCH | Freq: Once | TRANSDERMAL | Status: DC

## 2020-10-01 MED ORDER — NALOXONE HCL 0.4 MG/ML IJ SOLN: 0.4000 mg | INTRAMUSCULAR | Status: DC | PRN

## 2020-10-01 MED ORDER — ACETAMINOPHEN 10 MG/ML IV SOLN
INTRAVENOUS | Status: AC
  Filled 2020-10-01: qty 100

## 2020-10-01 MED ORDER — SODIUM CHLORIDE 0.9 % IV SOLN
500.0000 mg | Freq: Once | INTRAVENOUS | Status: AC
  Administered 2020-10-01: 500 mg via INTRAVENOUS

## 2020-10-01 MED ORDER — CEFAZOLIN SODIUM-DEXTROSE 2-4 GM/100ML-% IV SOLN
INTRAVENOUS | Status: AC
  Filled 2020-10-01: qty 100

## 2020-10-01 MED ORDER — DEXAMETHASONE SODIUM PHOSPHATE 10 MG/ML IJ SOLN
INTRAMUSCULAR | Status: DC | PRN
  Administered 2020-10-01: 10 mg via INTRAVENOUS

## 2020-10-01 MED ORDER — MORPHINE SULFATE (PF) 0.5 MG/ML IJ SOLN
INTRAMUSCULAR | Status: DC | PRN
Start: 2020-10-01 — End: 2020-10-01
  Administered 2020-10-01: 3 mg via EPIDURAL

## 2020-10-01 MED ORDER — SODIUM CHLORIDE 0.9% FLUSH: 3.0000 mL | INTRAVENOUS | Status: DC | PRN

## 2020-10-01 MED ORDER — OXYCODONE HCL 5 MG PO TABS
5.0000 mg | ORAL_TABLET | ORAL | Status: DC | PRN
  Administered 2020-10-02: 5 mg via ORAL
  Administered 2020-10-02 – 2020-10-03 (×4): 10 mg via ORAL
  Filled 2020-10-01 (×3): qty 2
  Filled 2020-10-01: qty 1
  Filled 2020-10-01: qty 2

## 2020-10-01 MED ORDER — MORPHINE SULFATE (PF) 2 MG/ML IV SOLN: 1.0000 mg | INTRAVENOUS | Status: DC | PRN

## 2020-10-01 MED ORDER — NALBUPHINE HCL 10 MG/ML IJ SOLN
5.0000 mg | INTRAMUSCULAR | Status: DC | PRN
  Administered 2020-10-01 – 2020-10-02 (×2): 5 mg via INTRAVENOUS
  Filled 2020-10-01 (×2): qty 1

## 2020-10-01 MED ORDER — CEFAZOLIN SODIUM-DEXTROSE 2-4 GM/100ML-% IV SOLN
2.0000 g | INTRAVENOUS | Status: AC
  Administered 2020-10-01: 2 g via INTRAVENOUS

## 2020-10-01 MED ORDER — DIPHENOXYLATE-ATROPINE 2.5-0.025 MG/5ML PO LIQD
5.0000 mL | Freq: Once | ORAL | Status: DC
  Filled 2020-10-01: qty 5

## 2020-10-01 MED ORDER — DIPHENHYDRAMINE HCL 25 MG PO CAPS: 25.0000 mg | ORAL_CAPSULE | Freq: Four times a day (QID) | ORAL | Status: DC | PRN

## 2020-10-01 MED ORDER — MORPHINE SULFATE (PF) 0.5 MG/ML IJ SOLN
INTRAMUSCULAR | Status: AC
Start: 2020-10-01 — End: ?
  Filled 2020-10-01: qty 10

## 2020-10-01 MED ORDER — PROMETHAZINE HCL 25 MG/ML IJ SOLN
12.5000 mg | Freq: Once | INTRAMUSCULAR | Status: AC
  Administered 2020-10-01: 12.5 mg via INTRAVENOUS
  Filled 2020-10-01: qty 1

## 2020-10-01 MED ORDER — INFLUENZA VAC SPLIT QUAD 0.5 ML IM SUSY
0.5000 mL | PREFILLED_SYRINGE | INTRAMUSCULAR | Status: AC
  Administered 2020-10-04: 0.5 mL via INTRAMUSCULAR
  Filled 2020-10-01: qty 0.5

## 2020-10-01 MED ORDER — KETOROLAC TROMETHAMINE 30 MG/ML IJ SOLN
INTRAMUSCULAR | Status: AC
  Filled 2020-10-01: qty 1

## 2020-10-01 MED ORDER — LIDOCAINE-EPINEPHRINE (PF) 2 %-1:200000 IJ SOLN
INTRAMUSCULAR | Status: DC | PRN
  Administered 2020-10-01: 3 mL via EPIDURAL
  Administered 2020-10-01: 10 mL via EPIDURAL
  Administered 2020-10-01: 5 mL via EPIDURAL

## 2020-10-01 MED ORDER — FENTANYL CITRATE (PF) 100 MCG/2ML IJ SOLN: 25.0000 ug | INTRAMUSCULAR | Status: DC | PRN

## 2020-10-01 SURGICAL SUPPLY — 38 items
APL SKNCLS STERI-STRIP NONHPOA (GAUZE/BANDAGES/DRESSINGS) ×1
BENZOIN TINCTURE PRP APPL 2/3 (GAUZE/BANDAGES/DRESSINGS) ×2 IMPLANT
CHLORAPREP W/TINT 26ML (MISCELLANEOUS) ×2 IMPLANT
CLAMP CORD UMBIL (MISCELLANEOUS) IMPLANT
CLOSURE STERI-STRIP 1/4X4 (GAUZE/BANDAGES/DRESSINGS) ×1 IMPLANT
CLOTH BEACON ORANGE TIMEOUT ST (SAFETY) ×2 IMPLANT
DRAPE C SECTION CLR SCREEN (DRAPES) ×1 IMPLANT
DRSG OPSITE POSTOP 4X10 (GAUZE/BANDAGES/DRESSINGS) ×2 IMPLANT
ELECT REM PT RETURN 9FT ADLT (ELECTROSURGICAL) ×2
ELECTRODE REM PT RTRN 9FT ADLT (ELECTROSURGICAL) ×1 IMPLANT
EXTRACTOR VACUUM KIWI (MISCELLANEOUS) IMPLANT
GLOVE BIOGEL M 7.0 STRL (GLOVE) ×2 IMPLANT
GLOVE BIOGEL PI IND STRL 7.0 (GLOVE) ×3 IMPLANT
GLOVE BIOGEL PI INDICATOR 7.0 (GLOVE) ×3
GOWN STRL REUS W/TWL LRG LVL3 (GOWN DISPOSABLE) ×6 IMPLANT
KIT ABG SYR 3ML LUER SLIP (SYRINGE) IMPLANT
NDL HYPO 25X5/8 SAFETYGLIDE (NEEDLE) IMPLANT
NEEDLE HYPO 25X5/8 SAFETYGLIDE (NEEDLE) IMPLANT
NS IRRIG 1000ML POUR BTL (IV SOLUTION) ×2 IMPLANT
PACK C SECTION WH (CUSTOM PROCEDURE TRAY) ×2 IMPLANT
PAD OB MATERNITY 4.3X12.25 (PERSONAL CARE ITEMS) ×2 IMPLANT
PENCIL SMOKE EVAC W/HOLSTER (ELECTROSURGICAL) ×2 IMPLANT
RTRCTR C-SECT PINK 25CM LRG (MISCELLANEOUS) IMPLANT
STRIP CLOSURE SKIN 1/2X4 (GAUZE/BANDAGES/DRESSINGS) ×2 IMPLANT
SUT MNCRL 0 VIOLET CTX 36 (SUTURE) IMPLANT
SUT MONOCRYL 0 CTX 36 (SUTURE) ×4
SUT PDS AB 0 CT1 27 (SUTURE) ×4 IMPLANT
SUT PLAIN 0 NONE (SUTURE) IMPLANT
SUT VIC AB 0 CTX 36 (SUTURE) ×6
SUT VIC AB 0 CTX36XBRD ANBCTRL (SUTURE) ×3 IMPLANT
SUT VIC AB 2-0 CT1 27 (SUTURE) ×2
SUT VIC AB 2-0 CT1 TAPERPNT 27 (SUTURE) ×1 IMPLANT
SUT VIC AB 3-0 SH 27 (SUTURE)
SUT VIC AB 3-0 SH 27X BRD (SUTURE) IMPLANT
SUT VIC AB 4-0 KS 27 (SUTURE) ×2 IMPLANT
TOWEL OR 17X24 6PK STRL BLUE (TOWEL DISPOSABLE) ×2 IMPLANT
TRAY FOLEY W/BAG SLVR 14FR LF (SET/KITS/TRAYS/PACK) ×2 IMPLANT
WATER STERILE IRR 1000ML POUR (IV SOLUTION) ×2 IMPLANT

## 2020-10-01 NOTE — Progress Notes (Signed)
OB Progress Note  In with Arlan Organ, CNM to evaluate patient after prolonged deceleration to 100s was noted (lasted ~7 minutes).  Recurrent lates also present.  Pitocin turned off and 1L IVF bolus running.  Patient on left side upon arrive.  SVE 8-9/100/-1, significant caput noted and asynclinitic.  Given non-reassuring fetal status, arrest of dilation, and prolonged rupture of membranes, would recommend delivery via cesarean section.  Patient verbalized understanding and agreeable.  I have explained to the patient that this surgery is performed to deliver their baby or babies through an incision in the abdomen and incision in the uterus.  Prior to surgery, the risks and benefits of the surgery, as well as alternative treatments were discussed.  The risks include, but are not limited to, possible need for cesarean delivery for all future pregnancies, bleeding at the time of surgery that could necessitate a blood transfusion and/or hysterectomy, rupture of the uterus during a future pregnancy that could cause a preterm delivery and/or requiring hysterectomy, infection, damage to surrounding organs and tissues, damage to bladder, damage to ureters, causing kidney damage, and requiring additional procedures, damage to bowels, resulting in further surgery, postoperative pain, short-term and long-term, scarring on the abdominal wall and intra-abdominally, need for further surgery, development of an incisional hernia, deep vein thrombosis and/or pulmonary embolism, wound infection and/or separation, painful intercourse, urinary leakage, impact on future pregnancies including but not limited to, abnormal location or attachment of the placenta to the uterus, such as placenta previa or accreta, that may necessitate a blood transfusion and/or hysterectomy, impact on total family size, complications the course of which cannot be predicted or prevented, and death. Patient was consented for blood products.  The patient  is aware that bleeding may result in the need for a blood transfusion which includes risk of transmission of HIV (1:2 million), Hepatitis C (1:2 million), and Hepatitis B (1:200 thousand) and transfusion reaction.  Patient voiced understanding of the above risks as well as understanding of indications for blood transfusion.  Anesthesia and L&D staff aware.  Appreciate assistance.  Steva Ready, DO

## 2020-10-01 NOTE — Anesthesia Postprocedure Evaluation (Signed)
Anesthesia Post Note  Patient: Michele Fields  Procedure(s) Performed: CESAREAN SECTION (N/A )     Patient location during evaluation: PACU Anesthesia Type: Epidural Level of consciousness: awake and alert Pain management: pain level controlled Vital Signs Assessment: post-procedure vital signs reviewed and stable Respiratory status: spontaneous breathing, nonlabored ventilation and respiratory function stable Cardiovascular status: stable Postop Assessment: no headache, no backache and epidural receding Anesthetic complications: no   No complications documented.  Last Vitals:  Vitals:   10/01/20 1620 10/01/20 1649  BP:  129/81  Pulse: (!) 108 86  Resp: (!) 21 18  Temp:  36.8 C  SpO2: 100% 99%    Last Pain:  Vitals:   10/01/20 1746  TempSrc:   PainSc: 0-No pain   Pain Goal: Patients Stated Pain Goal: 0 (09/30/20 0415)              Epidural/Spinal Function Cutaneous sensation: Normal sensation (10/01/20 1746), Patient able to flex knees: Yes (10/01/20 1746), Patient able to lift hips off bed: Yes (10/01/20 1746), Back pain beyond tenderness at insertion site: No (10/01/20 1746), Progressively worsening motor and/or sensory loss: No (10/01/20 1746), Bowel and/or bladder incontinence post epidural: No (10/01/20 1746)  Sian Joles L Ceaser Ebeling

## 2020-10-01 NOTE — Progress Notes (Signed)
Patient ID: Michele Fields, female   DOB: 1996-03-16, 24 y.o.   MRN: [id number] Michele Fields is a 24 y.o. G3P0010 at [redacted]w[redacted]d by LMP admitted for early contractions, gest HTN  Subjective: Tired, has a HA this am. Reports hx of migraines and this feels same. Denies RUQ pain or visual changes. Usually migraine responds to promethazine.  Family at [facility].   Objective: Vitals:   10/01/20 0620 10/01/20 0631 10/01/20 0731 10/01/20 0801  BP:  132/78 (!) 136/97   Pulse:  (!) 110 (!) 123   Resp:      Temp:    98.4 F (36.9 C)  TempSrc:    Oral  SpO2: 98%     Weight:      Height:        I/O last 3 completed shifts: In: -  Out: 2100 [Urine:2050; Emesis/NG output:50] No intake/output data recorded.   FHT:  FHR: 130 bpm, variability: moderate,  accelerations:  Present,  decelerations: occasional early decels with slow return to BL UC:   irregular, every 1.5 - 3.5 minutes SVE:   Dilation: 6 Effacement (%): 90 Station: -1 Exam by:: Colon Flattery, CNM Vertex, + caput Acynclitic, ROP  Labs:   Recent Labs    09/30/20 0443 09/30/20 1144  WBC 16.2* 16.6*  HGB 11.6* 11.4*  HCT 35.3* 34.9*  PLT 170 151   CMP     Component Value Date/Time   NA 134 (L) 09/30/2020 0443   NA 140 11/27/2014 2301   K 3.8 09/30/2020 0443   K 3.9 11/27/2014 2301   CL 105 09/30/2020 0443   CL 105 11/27/2014 2301   CO2 19 (L) 09/30/2020 0443   CO2 27 (H) 11/27/2014 2301   GLUCOSE 122 (H) 09/30/2020 0443   GLUCOSE 96 11/27/2014 2301   BUN 11 09/30/2020 0443   BUN 16 11/27/2014 2301   CREATININE 0.55 09/30/2020 0443   CREATININE 0.76 11/27/2014 2301   CALCIUM 9.3 09/30/2020 0443   CALCIUM 8.7 (L) 11/27/2014 2301   PROT 6.3 (L) 09/30/2020 0443   PROT 7.7 11/27/2014 2301   ALBUMIN 2.9 (L) 09/30/2020 0443   ALBUMIN 4.2 11/27/2014 2301   AST 19 09/30/2020 0443   AST 11 11/27/2014 2301   ALT 17 09/30/2020 0443   ALT 21 11/27/2014 2301   ALKPHOS 121 09/30/2020 0443   ALKPHOS 65 11/27/2014 2301   BILITOT 0.3  09/30/2020 0443   BILITOT 0.3 11/27/2014 2301   GFRNONAA >60 09/30/2020 0443   GFRAA >60 09/11/2020 2242    Assessment / Plan: G3P0010 24 y.o. [redacted]w[redacted]d Induction of labor due to gestational hypertension, protracted labor S/P IF and LDP, AROM 10/8 at 1330, clear AF Pitocin ongoing titrate, break x 2 hours during night, now at 14 mu/min  Labor: continue Pitocin titrate to MVU>200, position changes to facilitate rotation, Gest HTN: Will treat HA, no other PEC sx, labs stable and BP labile to mild range. Continue to monitor closely. Fetal Wellbeing:  Category I Pain Control:  Epidural I/D:  GBS neg, maternal tachy but afebrile, monitor closely for sx of chorio Anticipated MOD:  guarded for NSVB   Dr. Connye Burkitt updated.  [name], CNM, MSN 10/01/2020, 8:40 AM

## 2020-10-01 NOTE — Transfer of Care (Signed)
Immediate Anesthesia Transfer of Care Note  Patient: Michele Fields  Procedure(s) Performed: CESAREAN SECTION (N/A )  Patient Location: PACU  Anesthesia Type:Epidural  Level of Consciousness: awake, alert  and patient cooperative  Airway & Oxygen Therapy: Patient Spontanous Breathing  Post-op Assessment: Report given to RN  Post vital signs: Reviewed and stable  Last Vitals:  Vitals Value Taken Time  BP 119/69 10/01/20 1434  Temp 36.7 C 10/01/20 1434  Pulse 119 10/01/20 1438  Resp 19 10/01/20 1438  SpO2 98 % 10/01/20 1438  Vitals shown include unvalidated device data.  Last Pain:  Vitals:   10/01/20 1434  TempSrc: Oral  PainSc:       Patients Stated Pain Goal: 0 (09/30/20 0415)  Complications: No complications documented.

## 2020-10-01 NOTE — Progress Notes (Signed)
S: Comfortable with epidural. Frustrated with lack of labor progress. Family at the bedside providing support.   O: Vitals:   10/01/20 0331 10/01/20 0401 10/01/20 0431 10/01/20 0501  BP: 120/62 126/69 118/64 110/68  Pulse: (!) 111 (!) 135 (!) 124 (!) 113  Resp:      Temp:      TempSrc:      SpO2:      Weight:      Height:       FHT:  FHR: 135 bpm, variability: moderate,  accelerations:  Present,  decelerations:  Absent UC:   irregular, every 3-6 minutes SVE:   Dilation: 5 Effacement (%): 70 Station: -2 Exam by:: Lowry Bala, CNM  IUPC replaced without difficulty  A / P: Induction of labor due to gestational hypertension, s/p foley balloon, Pitocin infusing  Fetal Wellbeing:  Category I Pain Control:  Epidural Preeclampsia: BPs stable, no signs/symptoms of PEC, labs normal I/D: Afebrile Anticipated MOD:  Guarded   Pitocin was off from 0135 to 0300 and restarted at 63mu. Now infusing at 22mu. MVUs 100. Will continue to increase Pitocin until adequate MVUs. ROT presentation. Encourage frequent position changes to facilitate fetal rotation and descent.   Dr. Connye Burkitt updated on patient status and plan of care.   June Leap, CNM, MSN 10/01/2020, 5:32 AM

## 2020-10-01 NOTE — Lactation Note (Signed)
This note was copied from a baby's chart. Lactation Consultation Note  Patient Name: Boy Minnette Metzli Pollick Date: 10/01/2020  Baby boy Nyzir now 8 hours old. Nyzir born via csection  mom reports after difficult labor.  Nyzir has a bruised face.  Mom reports he did not breastfed after delivery but RN helped her here  and he recently fed. Mom reports she took and online breastfeeding class.  Mom reports she has a DEBP for home use.  Mom reports has not been taught hand expression.  LC asked if she could show her and mom agreed.  Lights dim.  Mom reports she had nipple bars until today that she took them out before delivery.  LC showed mom hand expression on her left breast.  Able to hand express easily.  Mom taught hand expression and able to demo back and get drops as well.  Showed mom how to put drops on her finger and put in his mouth.  Nyzir started cuing and LC assist with latch in cross cradle hold on left breast.  Infant breastfed close to 30 minutes with rythmic sucking.  Came off.  Still cuing.  Lc assist mom in offering the right breast.  He would open but just hold in his mouth.  LC showed mom how to hand express and spoon feed drops from a spoon.  Infant took approximately 3 ml from spoon.  Left mom changing him.  Urged her to retry to latch if he started cuing after diaper change. Urged to feed 8-12 times a day or more based on hunger cues,  Urged hand expression and spoon feeding past breastfeedngs and when infant would not latch due to bruising and csection delivery. Reviewed and left [facility] handout.  Urged mom to call lactation as needed.    Maternal Data    Feeding    LATCH Score                   Interventions    Lactation Tools Discussed/Used     Consult Status      Saloma Cadena Michaelle Copas 10/01/2020, 9:48 PM

## 2020-10-01 NOTE — Progress Notes (Signed)
S: Doing well, pain well controlled with  Epidural, + pelvic pressure, HA resolved with Tylenol and promethazine.    O: Vitals:   10/01/20 1001 10/01/20 1031 10/01/20 1101 10/01/20 1131  BP: 108/70 103/60 97/73 111/70  Pulse: (!) 106 (!) 102 (!) 142 (!) 117  Resp: 16     Temp: 98.2 F (36.8 C)     TempSrc: Oral     SpO2:      Weight:      Height:         FHT:  FHR: 140 bpm, variability: moderate,  accelerations:  Present,  decelerations:  Absent UC:   regular, every 2-3 minutes, MVU 200 SVE:   Dilation: 8.5 Effacement (%): 100 Station: -1, 0 Exam by:: Colon Flattery, CNM  + caput  Pitocin 20 mu/min  A / P: Induction of labor due to gestational hypertension,  progressing well on pitocin  Fetal Wellbeing:  Category I Pain Control:  Epidural  Anticipated MOD:  Working towards NSVB  Continue position changes and maintain Pitocin Monitor closely for chorio  [name], CNM, MSN 10/01/2020, 11:50 AM

## 2020-10-02 ENCOUNTER — Encounter (HOSPITAL_COMMUNITY): Payer: Self-pay | Admitting: Obstetrics and Gynecology

## 2020-10-02 DIAGNOSIS — O9902 Anemia complicating childbirth: Secondary | ICD-10-CM | POA: Diagnosis present

## 2020-10-02 LAB — CBC
HCT: 26.5 % — ABNORMAL LOW (ref 36.0–46.0)
Hemoglobin: 8.6 g/dL — ABNORMAL LOW (ref 12.0–15.0)
MCH: 30.4 pg (ref 26.0–34.0)
MCHC: 32.5 g/dL (ref 30.0–36.0)
MCV: 93.6 fL (ref 80.0–100.0)
Platelets: 141 10*3/uL — ABNORMAL LOW (ref 150–400)
RBC: 2.83 MIL/uL — ABNORMAL LOW (ref 3.87–5.11)
RDW: 14 % (ref 11.5–15.5)
WBC: 19 10*3/uL — ABNORMAL HIGH (ref 4.0–10.5)
nRBC: 0 % (ref 0.0–0.2)

## 2020-10-02 MED ORDER — MAGNESIUM OXIDE 400 (241.3 MG) MG PO TABS
400.0000 mg | ORAL_TABLET | Freq: Every day | ORAL | Status: DC
  Administered 2020-10-02 – 2020-10-04 (×3): 400 mg via ORAL
  Filled 2020-10-02 (×3): qty 1

## 2020-10-02 MED ORDER — POLYSACCHARIDE IRON COMPLEX 150 MG PO CAPS
150.0000 mg | ORAL_CAPSULE | Freq: Every day | ORAL | Status: DC
  Administered 2020-10-02 – 2020-10-04 (×3): 150 mg via ORAL
  Filled 2020-10-02 (×3): qty 1

## 2020-10-02 MED ORDER — IBUPROFEN 800 MG PO TABS
800.0000 mg | ORAL_TABLET | Freq: Three times a day (TID) | ORAL | Status: DC
  Administered 2020-10-02 – 2020-10-04 (×5): 800 mg via ORAL
  Filled 2020-10-02 (×5): qty 1

## 2020-10-02 MED ORDER — ACETAMINOPHEN 500 MG PO TABS
1000.0000 mg | ORAL_TABLET | Freq: Four times a day (QID) | ORAL | Status: DC
  Administered 2020-10-02 – 2020-10-04 (×5): 1000 mg via ORAL
  Filled 2020-10-02 (×6): qty 2

## 2020-10-02 NOTE — Op Note (Signed)
Cesarean Section Procedure Note  Indications: failure to progress: arrest of dilation, non-reassuring fetal status and prolonged rupture of membranes  Pre-operative Diagnosis: 39 week 0 day pregnancy.  Post-operative Diagnosis: same  Surgeon: Steva Ready   Assistants: Arlan Organ, CNM  Anesthesia: Epidural anesthesia  Procedure Details   The patient was seen in the Holding Room. The risks, benefits, complications, treatment options, and expected outcomes were discussed with the patient.  The patient concurred with the proposed plan, giving informed consent.  The site of surgery properly noted/marked. The patient was taken to Operating Room, identified as Michele Fields and the procedure verified as C-Section Delivery. A Time Out was held and the above information confirmed.  After induction of anesthesia, the patient was draped and prepped in the usual sterile manner. A Pfannenstiel incision was made and carried down through the subcutaneous tissue to the fascia. Fascial incision was made and extended transversely. The fascia was separated from the underlying rectus tissue superiorly and inferiorly. The peritoneum was identified and entered. Peritoneal incision was extended longitudinally.  A low transverse uterine incision was made. Delivered from cephalic (brow) presentation was a 3605 gram Female with Apgar scores of 9 at one minute and 9 at five minutes. After the umbilical cord was clamped and cut cord blood was obtained for evaluation. The placenta was removed intact and appeared normal. The uterine outline, tubes and ovaries appeared normal.  Hemabate x 1 dose was given for uterine atony. The uterine incision was closed with running locked sutures of 0-Monocryl in two layers. Hemostasis was observed. Lavage was carried out until clear.  The peritoneum was closed with 2-0 Vicryl. The fascia was then reapproximated with running sutures of 0-PDS.   The subcutaneous tissue was closed with  3-0 Monocryl. The skin was reapproximated with 4-0 Vicryl.  Instrument, sponge, and needle counts were correct prior the abdominal closure and at the conclusion of the case.   Findings: Normal-appearing uterus, fallopian tubes, and bilateral ovaries.  Amniotic fluid clear.  Fetus in cephalic presentation (brow).  Quantitative Blood Loss:  233cc         Drains: Foley         Total IV Fluids:          Specimens: Placenta and Disposition:  Sent to Pathology          Implants: None         Complications:  None; patient tolerated the procedure well.         Disposition: PACU - hemodynamically stable.         Condition: stable  Steva Ready, DO

## 2020-10-02 NOTE — Progress Notes (Signed)
Subjective: POD# 1 Live born female  Birth Weight: 7 lb 15.2 oz (3605 g) APGAR: 9, 9  Newborn Delivery   Birth date/time: 10/01/2020 13:42:00 Delivery type: C-Section, Low Transverse Trial of labor: Yes C-section categorization: Primary     Baby name: Michele Fields Delivering provider: Steva Ready   Circumcision: yes, planning inpatient Feeding: breast  Pain control at delivery: Epidural   Reports feeling well. Pain is well controlled with IV Toradol. Foley is out but has not been up to bathroom. Breastfeeding well but only on left side.   Patient reports tolerating PO.   Pain controlled with prescription NSAID's including Toradol Denies HA/SOB/C/P/N/V/dizziness. Flatus no. She reports vaginal bleeding as normal, without clots. She is ambulating in room. Foley is out but has not urinated.   Objective:  Vitals:   10/01/20 1919 10/01/20 2302 10/02/20 0324 10/02/20 0818  BP: 123/85  (!) 100/47 109/62  Pulse: (!) 114  95 (!) 106  Resp: 18 18 18 18   Temp: 99.1 F (37.3 C) 99.6 F (37.6 C) 98.6 F (37 C) 98.8 F (37.1 C)  TempSrc: Oral Oral Oral Oral  SpO2: 99% 98% 98% 99%  Weight:      Height:         Intake/Output Summary (Last 24 hours) at 10/02/2020 1254 Last data filed at 10/02/2020 1111 Gross per 24 hour  Intake 1688.82 ml  Output 2325 ml  Net -636.18 ml      Recent Labs    10/01/20 1634 10/02/20 0401  WBC 26.5* 19.0*  HGB 10.8* 8.6*  HCT 32.8* 26.5*  PLT 156 141*    Blood type: --/--/A POS (10/08 02-24-1987)  Rubella: 4.32 (03/08 1054)   Physical Exam:  General: alert and cooperative CV: Regular rate and rhythm Resp: clear Abdomen: soft, nontender, normal bowel sounds Incision: pressure dressing on, no drainage Uterine Fundus: firm, below umbilicus, nontender Lochia: moderate Ext: extremities normal, atraumatic, no cyanosis or edema and no edema, redness or tenderness in the calves or thighs  Assessment/Plan: 24 y.o.   POD# 1. 30                   Principal Problem:   Postpartum care following cesarean delivery 10/9 Active Problems:   Gestational hypertension  BPs WNL  Negative PEC labs  Denies symptoms including headache, epigastric pain, or visual changes   Cesarean delivery - AOD and FITL  Encourage rest when baby rests  Breastfeeding support  Encourage to ambulate  Routine post-op care   Maternal anemia, with delivery  Start PO Niferex and mag oxide  Anticipate discharge tomorrow or PO #3.   12/9, CNM, MSN 10/02/2020, 12:54 PM
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
## 2020-10-02 NOTE — Lactation Note (Signed)
This note was copied from a baby's chart. Lactation Consultation Note  Patient Name: Michele Fields Date: 10/02/2020 Reason for consult: Follow-up assessment;Primapara;1st time breastfeeding;Term;Infant weight loss  Visited with mom of 32 hours old FT female, she's a P1 and told LC that baby has been cluster feeding today. Mom nursing when entering the room, LC noticed that latch was shallow, baby was swaddled and only sucking the tip of the nipple.  LC asked permission to unswadle baby; mom agreed and this time he was able to latch much deeper at the breast, but the sucking was inconsistent, only a few audible swallows noted before he fell asleep at the breast. However mom knows how to differentiate between a shallow latch and a deep one.  Weight loss is only 2% and baby has been voiding/stooling/and feeding consistently. Mom aware of the duration of the cluster feeding phase. Reviewed normal newborn behavior, feeding cues, cluster feeding and size of baby's stomach.  Feeding plan:  1. Encouraged mom to keep taking baby to breast 8-12 times/24 hours or sooner if feeding cues are present 2. Hand expression and spoon feeding were also encouraged  Mom's sister was her support person in the room at the time of [facility] consultation. Family reported all questions and concerns were answered, they're both aware of LC OP services and will call PRN.   Maternal Data    Feeding Feeding Type: Breast Fed  LATCH Score Latch: Grasps breast easily, tongue down, lips flanged, rhythmical sucking.  Audible Swallowing: A few with stimulation (baby started to fall asleep when switched positions)  Type of Nipple: Everted at rest and after stimulation  Comfort (Breast/Nipple): Soft / non-tender  Hold (Positioning): Assistance needed to correctly position infant at breast and maintain latch.  LATCH Score: 8  Interventions Interventions: Breast feeding basics reviewed;Assisted with latch;Skin to  skin;Breast massage;Hand express;Breast compression;Adjust position;Support pillows  Lactation Tools Discussed/Used     Consult Status Consult Status: Follow-up Date: 10/03/20 Follow-up type: In-patient    Tamme Mozingo Venetia Constable 10/02/2020, 9:37 PM

## 2020-10-03 ENCOUNTER — Inpatient Hospital Stay: Payer: Medicaid Other

## 2020-10-03 MED ORDER — SIMETHICONE 80 MG PO CHEW: 80.0000 mg | CHEWABLE_TABLET | ORAL | 0 refills | Status: DC | PRN

## 2020-10-03 MED ORDER — POLYSACCHARIDE IRON COMPLEX 150 MG PO CAPS: 150.0000 mg | ORAL_CAPSULE | Freq: Every day | ORAL | Status: DC

## 2020-10-03 MED ORDER — ACETAMINOPHEN 500 MG PO TABS
1000.0000 mg | ORAL_TABLET | Freq: Four times a day (QID) | ORAL | 0 refills | Status: DC
Start: 2020-10-03 — End: 2024-05-30

## 2020-10-03 MED ORDER — OXYCODONE HCL 5 MG PO TABS: 5.0000 mg | ORAL_TABLET | ORAL | 0 refills | Status: DC | PRN

## 2020-10-03 MED ORDER — MAGNESIUM OXIDE -MG SUPPLEMENT 400 (240 MG) MG PO TABS: 400.0000 mg | ORAL_TABLET | Freq: Every day | ORAL | Status: DC

## 2020-10-03 MED ORDER — SENNOSIDES-DOCUSATE SODIUM 8.6-50 MG PO TABS: 2.0000 | ORAL_TABLET | ORAL | Status: DC

## 2020-10-03 MED ORDER — IBUPROFEN 800 MG PO TABS
800.0000 mg | ORAL_TABLET | Freq: Three times a day (TID) | ORAL | 0 refills | Status: DC
Start: 2020-10-03 — End: 2024-03-24

## 2020-10-03 NOTE — Lactation Note (Signed)
This note was copied from a baby's chart. Lactation Consultation Note  Patient Name: Michele Fields Date: 10/03/2020 Reason for consult: Follow-up assessment;1st time breastfeeding;Primapara;Term  Follow up with 42 hours old of a P1 mother. Infant is having a circumsicion upon arrival. Mother states breastfeeding is going well and does not reports any problems at this point. Provided some colostrum vials for EBM storage. Encouraged to used EBM after feeding to top infant up.   Reviewed with mother average size of a NB stomach. Encourage to follow babies' hunger and fullness cues. Reviewed importance to offer the breast 8 to 12 times in a 24-hour period for proper stimulation and to establish good milk supply.     Reviewed breastfeeding basics. Discussed milk coming to volume. Reviewed newborn behavior and expectations with mother and encouraged to contact [facility] for support, questions or concerns.    All questions answered at this time.    Maternal Data Formula Feeding for Exclusion: No Has patient been taught Hand Expression?: Yes  Feeding Feeding Type: Breast Milk  LATCH Score Latch: Grasps breast easily, tongue down, lips flanged, rhythmical sucking.  Audible Swallowing: A few with stimulation  Type of Nipple: Everted at rest and after stimulation  Comfort (Breast/Nipple): Soft / non-tender  Hold (Positioning): No assistance needed to correctly position infant at breast.  LATCH Score: 9  Interventions Interventions: Breast feeding basics reviewed;Hand express;Expressed milk  Lactation Tools Discussed/Used     Consult Status Consult Status: Follow-up Date: 10/04/20 Follow-up type: In-patient    Meredith Kilbride A Higuera Ancidey 10/03/2020, 2:23 PM

## 2020-10-03 NOTE — Discharge Summary (Addendum)
OB Discharge Summary  Patient Name: Michele Fields DOB: [birth year] MRN: [id number]  Date of admission: 09/30/2020 Delivering provider: Steva Ready   Admitting diagnosis: Gestational hypertension [O13.9] Intrauterine pregnancy: [redacted]w[redacted]d     Secondary diagnosis: Patient Active Problem List   Diagnosis Date Noted   Maternal anemia, with delivery - IDA with superimposed ABL anemia 10/02/2020   Postpartum care following cesarean delivery 10/9 10/01/2020   Cesarean delivery - AOD and FITL 10/01/2020   Gestational hypertension 09/30/2020   Chronic migraine 09/13/2020   Additional problems:none   Date of discharge: 10/03/2020   Discharge diagnosis: Principal Problem:   Postpartum care following cesarean delivery 10/9 Active Problems:   Gestational hypertension   Cesarean delivery - AOD and FITL   Maternal anemia, with delivery - IDA with superimposed ABL anemia                                                              Post partum procedures: Patient offered flu and covid vaccines prior to discharge  Augmentation: AROM, Pitocin and IP Foley Pain control: Epidural  Laceration:None  Episiotomy:None  Complications: None  Hospital course:  Induction of Labor With Cesarean Section   24 y.o. yo G3P1011 at [redacted]w[redacted]d was admitted to the hospital 09/30/2020 for induction of labor. Patient had a labor course significant for prolonged labor with slow progress to 9 cm then arrest and non-reassuring fetal heart tones. The patient went for cesarean section due to Arrest of Dilation and Non-Reassuring FHR. Delivery details are as follows: Membrane Rupture Time/Date: 1:30 PM ,09/30/2020   Delivery Method:C-Section, Low Transverse  Details of operation can be found in separate operative Note.  Patient had an uncomplicated postpartum course. She is ambulating, tolerating a regular diet, passing flatus, and urinating well.  Patient is discharged home in stable condition on 10/03/20.      Newborn  Data: Birth date:10/01/2020  Birth time:1:42 PM  Gender:Female  Living status:Living  Apgars:9 ,9  Weight:3605 g                                 Physical exam  Vitals:   10/02/20 0818 10/02/20 1306 10/02/20 2355 10/03/20 0626  BP: 109/62 (!) 94/53 124/77 109/68  Pulse: (!) 106 (!) 115 (!) 107 85  Resp: 18 20 18 18   Temp: 98.8 F (37.1 C) 99 F (37.2 C) 99.7 F (37.6 C) 98.6 F (37 C)  TempSrc: Oral Oral Oral Oral  SpO2: 99%  100% 100%  Weight:      Height:       General: alert, cooperative and no distress Lochia: appropriate Uterine Fundus: firm Incision: Healing well with no significant drainage, Dressing is clean, dry, and intact DVT Evaluation: No cords or calf tenderness. No significant calf/ankle edema. Labs: Lab Results  Component Value Date   WBC 19.0 (H) 10/02/2020   HGB 8.6 (L) 10/02/2020   HCT 26.5 (L) 10/02/2020   MCV 93.6 10/02/2020   PLT 141 (L) 10/02/2020   CMP Latest Ref Rng & Units 09/30/2020  Glucose 70 - 99 mg/dL 11/30/2020)  BUN 6 - 20 mg/dL 11  Creatinine 299(B - 7.16 mg/dL 9.67  Sodium 8.93 - 810 mmol/L 134(L)  Potassium 3.5 - 5.1 mmol/L 3.8  Chloride 98 - 111 mmol/L 105  CO2 22 - 32 mmol/L 19(L)  Calcium 8.9 - 10.3 mg/dL 9.3  Total Protein 6.5 - 8.1 g/dL 6.3(L)  Total Bilirubin 0.3 - 1.2 mg/dL 0.3  Alkaline Phos 38 - 126 U/L 121  AST 15 - 41 U/L 19  ALT 0 - 44 U/L 17   Edinburgh Postnatal Depression Scale Screening Tool 10/01/2020  I have been able to laugh and see the funny side of things. 0  I have looked forward with enjoyment to things. 0  I have blamed myself unnecessarily when things went wrong. 1  I have been anxious or worried for no good reason. 2  I have felt scared or panicky for no good reason. 1  Things have been getting on top of me. 0  I have been so unhappy that I have had difficulty sleeping. 0  I have felt sad or miserable. 0  I have been so unhappy that I have been crying. 0  The thought of harming myself has occurred to  me. 0  Edinburgh Postnatal Depression Scale Total 4   Vaccines: TDaP          UTD         Flu             Prior to discharge                    COVID-19 offered prior to discharge, patient has had Covid illness 9 months ago, recommended she receive at least one dose of vaccine  Discharge instruction:  per After Visit Summary,   "Understanding Mother & Baby Care" hospital booklet  After Visit Meds:  Allergies as of 10/03/2020      Reactions   Other Other (See Comments)   Seasonal Allergies      Medication List    STOP taking these medications   acyclovir 400 MG tablet Commonly known as: ZOVIRAX     TAKE these medications   acetaminophen 500 MG tablet Commonly known as: TYLENOL Take 2 tablets (1,000 mg total) by mouth every 6 (six) hours.   Comfort Fit Maternity Supp Lg Misc 1 Units by Does not apply route daily.   Concept OB 130-92.4-1 MG Caps Take 1 capsule by mouth daily.   cyclobenzaprine 10 MG tablet Commonly known as: FLEXERIL Take 1 tablet (10 mg total) by mouth 3 (three) times daily as needed (back pain).   ibuprofen 800 MG tablet Commonly known as: ADVIL Take 1 tablet (800 mg total) by mouth every 8 (eight) hours.   iron polysaccharides 150 MG capsule Commonly known as: Ferrex 150 Take 1 capsule (150 mg total) by mouth daily.   lidocaine 5 % Commonly known as: Lidoderm Place 1 patch onto the skin daily. Remove & Discard patch within 12 hours or as directed by MD   Magnesium Oxide 400 (240 Mg) MG Tabs Take 1 tablet (400 mg total) by mouth daily. For prevention of constipation.   oxyCODONE 5 MG immediate release tablet Commonly known as: Oxy IR/ROXICODONE Take 1-2 tablets (5-10 mg total) by mouth every 4 (four) hours as needed for moderate pain.   senna-docusate 8.6-50 MG tablet Commonly known as: Senokot-S Take 2 tablets by mouth daily. Start taking on: October 04, 2020   simethicone 80 MG chewable tablet Commonly known as: MYLICON Chew 1 tablet  (80 mg total) by mouth as needed for flatulence.       Diet: iron rich diet  Activity: Advance  as tolerated. Pelvic rest for 6 weeks.   Postpartum contraception: Undecided  Newborn Data: Live born female  Birth Weight: 7 lb 15.2 oz (3605 g) APGAR: 9, 9  Newborn Delivery   Birth date/time: 10/01/2020 13:42:00 Delivery type: C-Section, Low Transverse Trial of labor: Yes C-section categorization: Primary      named Nyzir Baby Feeding: Breast Disposition:home with mother   Delivery Report:  Review the Delivery Report for details.    Follow up:  Follow-up Information    [facility]. Schedule an appointment as soon as possible for a visit in 1 week(s).   Specialty: Obstetrics and Gynecology Why: For blood pressure check Contact information: [address] [place] [address] [phone]                Signed: Cipriano Mile, MSN 10/03/2020, 11:25 AM

## 2020-10-04 LAB — SURGICAL PATHOLOGY

## 2020-10-04 NOTE — Lactation Note (Signed)
This note was copied from a baby's chart. Lactation Consultation Note  Patient Name: Boy Jaydy Kyna Blahnik Date: 10/04/2020 Reason for consult: Follow-up assessment   Mother is a P6, infant is 51 hours old and is now at 7 % wt loss.  .  Mother reports that infant is feeding well.  Mother denies needing any assistance with latching . She reports that she does have some soreness and ask what to do about soreness.  Discuss changing positions frequently and using good firm pillow support.  Mother was observed with infant latched on at the left breast. Observed infant suckling with audible swallows. Infant sustained latch for 20 mins.  Discussed treatment and prevention of engorgement.   Mother to continue to cue base feed infant and feed at least 8-12 times or more in 24 hours and advised to allow for cluster feeding infant as needed.  Mother to continue to due STS. Mother is aware of available LC services at [facility], BFSG'S, OP Dept, and phone # for questions or concerns about breastfeeding.  Mother receptive to all teaching and plan of care.     Maternal Data    Feeding Feeding Type: Breast Fed  LATCH Score Latch: Grasps breast easily, tongue down, lips flanged, rhythmical sucking.  Audible Swallowing: Spontaneous and intermittent  Type of Nipple: Everted at rest and after stimulation  Comfort (Breast/Nipple): Soft / non-tender  Hold (Positioning): No assistance needed to correctly position infant at breast.  LATCH Score: 10  Interventions Interventions: Comfort gels;Hand pump  Lactation Tools Discussed/Used     Consult Status Consult Status: Complete    Michel Bickers 10/04/2020, 10:41 AM

## 2021-03-27 ENCOUNTER — Ambulatory Visit (HOSPITAL_COMMUNITY): Payer: Self-pay

## 2021-03-29 ENCOUNTER — Ambulatory Visit: Payer: Medicaid Other | Admitting: Obstetrics and Gynecology

## 2021-03-29 ENCOUNTER — Ambulatory Visit: Payer: Medicaid Other | Admitting: Advanced Practice Midwife

## 2021-05-23 LAB — OB RESULTS CONSOLE RUBELLA ANTIBODY, IGM: Rubella: IMMUNE

## 2021-05-23 LAB — OB RESULTS CONSOLE HEPATITIS B SURFACE ANTIGEN: Hepatitis B Surface Ag: NEGATIVE

## 2021-05-23 LAB — HEPATITIS C ANTIBODY: HCV Ab: NEGATIVE

## 2021-05-23 LAB — OB RESULTS CONSOLE HIV ANTIBODY (ROUTINE TESTING): HIV: NONREACTIVE

## 2021-10-04 ENCOUNTER — Ambulatory Visit: Payer: Medicaid Other

## 2021-10-05 ENCOUNTER — Other Ambulatory Visit: Payer: Self-pay | Admitting: Obstetrics and Gynecology

## 2021-10-16 ENCOUNTER — Ambulatory Visit: Payer: Medicaid Other | Admitting: Cardiology

## 2021-10-18 ENCOUNTER — Encounter: Payer: Medicaid Other | Attending: Obstetrics and Gynecology | Admitting: Registered"

## 2021-10-18 ENCOUNTER — Other Ambulatory Visit: Payer: Self-pay

## 2021-10-18 DIAGNOSIS — O24419 Gestational diabetes mellitus in pregnancy, unspecified control: Secondary | ICD-10-CM | POA: Diagnosis not present

## 2021-10-20 DIAGNOSIS — O24419 Gestational diabetes mellitus in pregnancy, unspecified control: Secondary | ICD-10-CM | POA: Insufficient documentation

## 2021-10-20 NOTE — Progress Notes (Signed)
Patient was seen on 10/20/21 for Gestational Diabetes self-management class at the [facility]. The following learning objectives were met by the patient during this course:  States the definition of Gestational Diabetes States why dietary management is important in controlling blood glucose Describes the effects each nutrient has on blood glucose levels Demonstrates ability to create a balanced meal plan Demonstrates carbohydrate counting  States when to check blood glucose levels Demonstrates proper blood glucose monitoring techniques States the effect of stress and exercise on blood glucose levels States the importance of limiting caffeine and abstaining from alcohol and smoking  Blood glucose monitor given: Accu-chek Guide Me Lot #[id number] Exp: 01/04/2023 CBG: 90 mg/dL   Patient instructed to monitor glucose levels: FBS: 60 - <95; 1 hour: <140; 2 hour: <120  Patient received handouts: Nutrition Diabetes and Pregnancy, including carb counting list  Patient will be seen for follow-up as needed.

## 2021-11-06 LAB — OB RESULTS CONSOLE GBS: GBS: NEGATIVE

## 2021-11-24 ENCOUNTER — Other Ambulatory Visit: Payer: Self-pay

## 2021-11-24 ENCOUNTER — Other Ambulatory Visit: Payer: Self-pay | Admitting: Obstetrics and Gynecology

## 2021-11-24 ENCOUNTER — Encounter (HOSPITAL_COMMUNITY)
Admission: RE | Admit: 2021-11-24 | Discharge: 2021-11-24 | Disposition: A | Discharge status: Home / Self Care | Payer: Medicaid Other | Source: Ambulatory Visit | Attending: Obstetrics and Gynecology | Admitting: Obstetrics and Gynecology

## 2021-11-24 DIAGNOSIS — Z01812 Encounter for preprocedural laboratory examination: Secondary | ICD-10-CM | POA: Diagnosis present

## 2021-11-24 LAB — CBC
HCT: 34 % — ABNORMAL LOW (ref 36.0–46.0)
Hemoglobin: 11.6 g/dL — ABNORMAL LOW (ref 12.0–15.0)
MCH: 30.6 pg (ref 26.0–34.0)
MCHC: 34.1 g/dL (ref 30.0–36.0)
MCV: 89.7 fL (ref 80.0–100.0)
Platelets: 195 10*3/uL (ref 150–400)
RBC: 3.79 MIL/uL — ABNORMAL LOW (ref 3.87–5.11)
RDW: 13.9 % (ref 11.5–15.5)
WBC: 14 10*3/uL — ABNORMAL HIGH (ref 4.0–10.5)
nRBC: 0 % (ref 0.0–0.2)

## 2021-11-24 LAB — COMPREHENSIVE METABOLIC PANEL
ALT: 11 U/L (ref 0–44)
AST: 13 U/L — ABNORMAL LOW (ref 15–41)
Albumin: 3.1 g/dL — ABNORMAL LOW (ref 3.5–5.0)
Alkaline Phosphatase: 109 U/L (ref 38–126)
Anion gap: 10 (ref 5–15)
BUN: <5 mg/dL — ABNORMAL LOW (ref 6–20)
CO2: 20 mmol/L — ABNORMAL LOW (ref 22–32)
Calcium: 8.9 mg/dL (ref 8.9–10.3)
Chloride: 106 mmol/L (ref 98–111)
Creatinine, Ser: 0.52 mg/dL (ref 0.44–1.00)
GFR, Estimated: >60 mL/min (ref >60)
Glucose, Bld: 85 mg/dL (ref 70–99)
Potassium: 3.8 mmol/L (ref 3.5–5.1)
Sodium: 136 mmol/L (ref 135–145)
Total Bilirubin: 0.5 mg/dL (ref 0.3–1.2)
Total Protein: 6.6 g/dL (ref 6.5–8.1)

## 2021-11-24 LAB — TYPE AND SCREEN
ABO/RH(D): A POS
Antibody Screen: NEGATIVE

## 2021-11-24 LAB — SARS CORONAVIRUS 2 (TAT 6-24 HRS): SARS Coronavirus 2: NEGATIVE

## 2021-11-24 NOTE — Patient Instructions (Signed)
Conswella Devaul  11/24/2021   Your procedure is scheduled on:  11/26/2021  Arrive at 7:15 AM at Entrance C on [address] at [facility]  and [employer]. You are invited to use the FREE valet parking or use the Visitor's parking deck.  Pick up the phone at the desk and dial [phone].  Call this number if you have problems the morning of surgery: [phone]  Remember:   Do not eat food:(After Midnight) Desps de medianoche.  Do not drink clear liquids: (After Midnight) Desps de medianoche.  Take these medicines the morning of surgery with A SIP OF WATER:  none   Do not wear jewelry, make-up or nail polish.  Do not wear lotions, powders, or perfumes. Do not wear deodorant.  Do not shave 48 hours prior to surgery.  Do not bring valuables to the hospital.  [facility] is not   responsible for any belongings or valuables brought to the hospital.  Contacts, dentures or bridgework may not be worn into surgery.  Leave suitcase in the car. After surgery it may be brought to your room.  For patients admitted to the hospital, checkout time is 11:00 AM the day of              discharge.      Please read over the following fact sheets that you were given:     Preparing for Surgery

## 2021-11-25 LAB — RPR: RPR Ser Ql: NONREACTIVE

## 2021-11-26 ENCOUNTER — Encounter (HOSPITAL_COMMUNITY)
Admission: RE | Disposition: A | Discharge status: Home / Self Care | Payer: Self-pay | Source: Home / Self Care | Attending: Obstetrics and Gynecology

## 2021-11-26 ENCOUNTER — Inpatient Hospital Stay (HOSPITAL_COMMUNITY)
Admission: RE | Admit: 2021-11-26 | Discharge: 2021-11-28 | DRG: 787 | Disposition: A | Discharge status: Home / Self Care | Payer: Medicaid Other | Attending: Obstetrics & Gynecology | Admitting: Obstetrics & Gynecology

## 2021-11-26 ENCOUNTER — Inpatient Hospital Stay (HOSPITAL_COMMUNITY): Payer: Medicaid Other | Admitting: Anesthesiology

## 2021-11-26 ENCOUNTER — Other Ambulatory Visit: Payer: Self-pay

## 2021-11-26 ENCOUNTER — Encounter (HOSPITAL_COMMUNITY): Payer: Self-pay | Admitting: Obstetrics and Gynecology

## 2021-11-26 DIAGNOSIS — O24425 Gestational diabetes mellitus in childbirth, controlled by oral hypoglycemic drugs: Secondary | ICD-10-CM | POA: Diagnosis present

## 2021-11-26 DIAGNOSIS — A6000 Herpesviral infection of urogenital system, unspecified: Secondary | ICD-10-CM | POA: Diagnosis present

## 2021-11-26 DIAGNOSIS — Z98891 History of uterine scar from previous surgery: Secondary | ICD-10-CM

## 2021-11-26 DIAGNOSIS — O34211 Maternal care for low transverse scar from previous cesarean delivery: Secondary | ICD-10-CM | POA: Diagnosis present

## 2021-11-26 DIAGNOSIS — Z87891 Personal history of nicotine dependence: Secondary | ICD-10-CM

## 2021-11-26 DIAGNOSIS — Z3A39 39 weeks gestation of pregnancy: Secondary | ICD-10-CM

## 2021-11-26 DIAGNOSIS — O9832 Other infections with a predominantly sexual mode of transmission complicating childbirth: Secondary | ICD-10-CM | POA: Diagnosis present

## 2021-11-26 DIAGNOSIS — O24419 Gestational diabetes mellitus in pregnancy, unspecified control: Secondary | ICD-10-CM | POA: Diagnosis present

## 2021-11-26 DIAGNOSIS — B009 Herpesviral infection, unspecified: Secondary | ICD-10-CM | POA: Diagnosis not present

## 2021-11-26 SURGERY — Surgical Case
Anesthesia: Spinal

## 2021-11-26 MED ORDER — MEDROXYPROGESTERONE ACETATE 150 MG/ML IM SUSP
150.0000 mg | INTRAMUSCULAR | Status: AC | PRN
  Administered 2021-11-28: 150 mg via INTRAMUSCULAR
  Filled 2021-11-26: qty 1

## 2021-11-26 MED ORDER — FENTANYL CITRATE (PF) 100 MCG/2ML IJ SOLN
INTRAMUSCULAR | Status: DC | PRN
  Administered 2021-11-26: 10:00:00 85 ug via INTRAVENOUS

## 2021-11-26 MED ORDER — MORPHINE SULFATE (PF) 0.5 MG/ML IJ SOLN
INTRAMUSCULAR | Status: AC
  Filled 2021-11-26: qty 10

## 2021-11-26 MED ORDER — DIBUCAINE (PERIANAL) 1 % EX OINT: 1.0000 "application " | TOPICAL_OINTMENT | CUTANEOUS | Status: DC | PRN

## 2021-11-26 MED ORDER — CEFAZOLIN SODIUM-DEXTROSE 2-4 GM/100ML-% IV SOLN
INTRAVENOUS | Status: AC
  Filled 2021-11-26: qty 100

## 2021-11-26 MED ORDER — KETOROLAC TROMETHAMINE 30 MG/ML IJ SOLN: 30.0000 mg | Freq: Four times a day (QID) | INTRAMUSCULAR | Status: DC | PRN

## 2021-11-26 MED ORDER — NALBUPHINE HCL 10 MG/ML IJ SOLN: 5.0000 mg | Freq: Once | INTRAMUSCULAR | Status: DC | PRN

## 2021-11-26 MED ORDER — NALBUPHINE HCL 10 MG/ML IJ SOLN: 5.0000 mg | INTRAMUSCULAR | Status: DC | PRN

## 2021-11-26 MED ORDER — KETOROLAC TROMETHAMINE 30 MG/ML IJ SOLN
INTRAMUSCULAR | Status: AC
  Filled 2021-11-26: qty 1

## 2021-11-26 MED ORDER — TETANUS-DIPHTH-ACELL PERTUSSIS 5-2.5-18.5 LF-MCG/0.5 IM SUSY: 0.5000 mL | PREFILLED_SYRINGE | Freq: Once | INTRAMUSCULAR | Status: DC

## 2021-11-26 MED ORDER — OXYCODONE HCL 5 MG PO TABS
5.0000 mg | ORAL_TABLET | ORAL | Status: DC | PRN
  Administered 2021-11-26: 5 mg via ORAL
  Administered 2021-11-27: 10 mg via ORAL
  Administered 2021-11-27 (×2): 5 mg via ORAL
  Administered 2021-11-27 – 2021-11-28 (×2): 10 mg via ORAL
  Filled 2021-11-26 (×3): qty 1
  Filled 2021-11-26 (×3): qty 2

## 2021-11-26 MED ORDER — PRENATAL MULTIVITAMIN CH
1.0000 | ORAL_TABLET | Freq: Every day | ORAL | Status: DC
  Administered 2021-11-27: 1 via ORAL
  Filled 2021-11-26: qty 1

## 2021-11-26 MED ORDER — SENNOSIDES-DOCUSATE SODIUM 8.6-50 MG PO TABS
2.0000 | ORAL_TABLET | Freq: Every day | ORAL | Status: DC
  Administered 2021-11-27 – 2021-11-28 (×2): 2 via ORAL
  Filled 2021-11-26 (×2): qty 2

## 2021-11-26 MED ORDER — ZOLPIDEM TARTRATE 5 MG PO TABS: 5.0000 mg | ORAL_TABLET | Freq: Every evening | ORAL | Status: DC | PRN

## 2021-11-26 MED ORDER — OXYTOCIN-SODIUM CHLORIDE 30-0.9 UT/500ML-% IV SOLN: INTRAVENOUS | Status: DC | PRN

## 2021-11-26 MED ORDER — MENTHOL 3 MG MT LOZG: 1.0000 | LOZENGE | OROMUCOSAL | Status: DC | PRN

## 2021-11-26 MED ORDER — SCOPOLAMINE 1 MG/3DAYS TD PT72
1.0000 | MEDICATED_PATCH | Freq: Once | TRANSDERMAL | Status: DC
  Administered 2021-11-26: 11:00:00 1.5 mg via TRANSDERMAL

## 2021-11-26 MED ORDER — OXYTOCIN-SODIUM CHLORIDE 30-0.9 UT/500ML-% IV SOLN
INTRAVENOUS | Status: AC
  Filled 2021-11-26: qty 500

## 2021-11-26 MED ORDER — OXYTOCIN-SODIUM CHLORIDE 30-0.9 UT/500ML-% IV SOLN: 2.5000 [IU]/h | INTRAVENOUS | Status: AC

## 2021-11-26 MED ORDER — FENTANYL CITRATE (PF) 100 MCG/2ML IJ SOLN
INTRAMUSCULAR | Status: AC
  Filled 2021-11-26: qty 2

## 2021-11-26 MED ORDER — SODIUM CHLORIDE 0.9% FLUSH: 3.0000 mL | INTRAVENOUS | Status: DC | PRN

## 2021-11-26 MED ORDER — OXYTOCIN-SODIUM CHLORIDE 30-0.9 UT/500ML-% IV SOLN
INTRAVENOUS | Status: DC | PRN
  Administered 2021-11-26: 300 mL via INTRAVENOUS

## 2021-11-26 MED ORDER — CEFAZOLIN SODIUM-DEXTROSE 2-4 GM/100ML-% IV SOLN
2.0000 g | INTRAVENOUS | Status: AC
  Administered 2021-11-26: 10:00:00 2 g via INTRAVENOUS

## 2021-11-26 MED ORDER — IBUPROFEN 600 MG PO TABS
600.0000 mg | ORAL_TABLET | Freq: Four times a day (QID) | ORAL | Status: DC
  Administered 2021-11-27 – 2021-11-28 (×4): 600 mg via ORAL
  Filled 2021-11-26 (×4): qty 1

## 2021-11-26 MED ORDER — FENTANYL CITRATE (PF) 100 MCG/2ML IJ SOLN
INTRAMUSCULAR | Status: DC | PRN
  Administered 2021-11-26: 15 ug via INTRATHECAL

## 2021-11-26 MED ORDER — LACTATED RINGERS IV SOLN: INTRAVENOUS | Status: AC

## 2021-11-26 MED ORDER — DIPHENHYDRAMINE HCL 25 MG PO CAPS
25.0000 mg | ORAL_CAPSULE | ORAL | Status: DC | PRN
  Administered 2021-11-27: 25 mg via ORAL
  Filled 2021-11-26: qty 1

## 2021-11-26 MED ORDER — LACTATED RINGERS IV SOLN: INTRAVENOUS | Status: DC

## 2021-11-26 MED ORDER — DIPHENHYDRAMINE HCL 50 MG/ML IJ SOLN
12.5000 mg | INTRAMUSCULAR | Status: DC | PRN
  Administered 2021-11-26: 16:00:00 12.5 mg via INTRAVENOUS
  Filled 2021-11-26: qty 1

## 2021-11-26 MED ORDER — SCOPOLAMINE 1 MG/3DAYS TD PT72
MEDICATED_PATCH | TRANSDERMAL | Status: AC
  Filled 2021-11-26: qty 1

## 2021-11-26 MED ORDER — CHLOROPROCAINE HCL (PF) 3 % IJ SOLN
INTRAMUSCULAR | Status: AC
  Filled 2021-11-26: qty 20

## 2021-11-26 MED ORDER — NALOXONE HCL 4 MG/10ML IJ SOLN
1.0000 ug/kg/h | INTRAVENOUS | Status: DC | PRN
  Filled 2021-11-26: qty 5

## 2021-11-26 MED ORDER — ACETAMINOPHEN 10 MG/ML IV SOLN
INTRAVENOUS | Status: DC | PRN
  Administered 2021-11-26: 1000 mg via INTRAVENOUS

## 2021-11-26 MED ORDER — COCONUT OIL OIL
1.0000 "application " | TOPICAL_OIL | Status: DC | PRN
  Administered 2021-11-27: 1 via TOPICAL

## 2021-11-26 MED ORDER — MORPHINE SULFATE (PF) 0.5 MG/ML IJ SOLN
INTRAMUSCULAR | Status: DC | PRN
  Administered 2021-11-26: 150 ug via INTRATHECAL

## 2021-11-26 MED ORDER — BUPIVACAINE IN DEXTROSE 0.75-8.25 % IT SOLN
INTRATHECAL | Status: DC | PRN
  Administered 2021-11-26: 1.6 mL via INTRATHECAL

## 2021-11-26 MED ORDER — KETOROLAC TROMETHAMINE 30 MG/ML IJ SOLN
30.0000 mg | Freq: Four times a day (QID) | INTRAMUSCULAR | Status: AC
  Administered 2021-11-26 – 2021-11-27 (×3): 30 mg via INTRAVENOUS
  Filled 2021-11-26 (×3): qty 1

## 2021-11-26 MED ORDER — ONDANSETRON HCL 4 MG/2ML IJ SOLN
INTRAMUSCULAR | Status: AC
  Filled 2021-11-26: qty 2

## 2021-11-26 MED ORDER — PHENYLEPHRINE HCL-NACL 20-0.9 MG/250ML-% IV SOLN
INTRAVENOUS | Status: DC | PRN
  Administered 2021-11-26: 60 ug/min via INTRAVENOUS

## 2021-11-26 MED ORDER — KETOROLAC TROMETHAMINE 30 MG/ML IJ SOLN
30.0000 mg | Freq: Four times a day (QID) | INTRAMUSCULAR | Status: DC | PRN
  Administered 2021-11-26: 12:00:00 30 mg via INTRAVENOUS

## 2021-11-26 MED ORDER — NALOXONE HCL 0.4 MG/ML IJ SOLN: 0.4000 mg | INTRAMUSCULAR | Status: DC | PRN

## 2021-11-26 MED ORDER — POVIDONE-IODINE 10 % EX SWAB: 2.0000 "application " | Freq: Once | CUTANEOUS | Status: DC

## 2021-11-26 MED ORDER — PHENYLEPHRINE HCL-NACL 20-0.9 MG/250ML-% IV SOLN
INTRAVENOUS | Status: AC
  Filled 2021-11-26: qty 250

## 2021-11-26 MED ORDER — WITCH HAZEL-GLYCERIN EX PADS: 1.0000 "application " | MEDICATED_PAD | CUTANEOUS | Status: DC | PRN

## 2021-11-26 MED ORDER — SIMETHICONE 80 MG PO CHEW
80.0000 mg | CHEWABLE_TABLET | Freq: Three times a day (TID) | ORAL | Status: DC
  Administered 2021-11-26 – 2021-11-28 (×4): 80 mg via ORAL
  Filled 2021-11-26 (×4): qty 1

## 2021-11-26 MED ORDER — MEPERIDINE HCL 25 MG/ML IJ SOLN: 6.2500 mg | INTRAMUSCULAR | Status: DC | PRN

## 2021-11-26 MED ORDER — ONDANSETRON HCL 4 MG/2ML IJ SOLN
INTRAMUSCULAR | Status: DC | PRN
  Administered 2021-11-26: 4 mg via INTRAVENOUS

## 2021-11-26 MED ORDER — ACETAMINOPHEN 500 MG PO TABS
1000.0000 mg | ORAL_TABLET | Freq: Four times a day (QID) | ORAL | Status: DC | PRN
  Administered 2021-11-26 – 2021-11-28 (×5): 1000 mg via ORAL
  Filled 2021-11-26 (×5): qty 2

## 2021-11-26 MED ORDER — DIPHENHYDRAMINE HCL 25 MG PO CAPS: 25.0000 mg | ORAL_CAPSULE | Freq: Four times a day (QID) | ORAL | Status: DC | PRN

## 2021-11-26 MED ORDER — ACETAMINOPHEN 10 MG/ML IV SOLN
INTRAVENOUS | Status: AC
  Filled 2021-11-26: qty 100

## 2021-11-26 MED ORDER — ONDANSETRON HCL 4 MG/2ML IJ SOLN: 4.0000 mg | Freq: Three times a day (TID) | INTRAMUSCULAR | Status: DC | PRN

## 2021-11-26 MED ORDER — SIMETHICONE 80 MG PO CHEW: 80.0000 mg | CHEWABLE_TABLET | ORAL | Status: DC | PRN

## 2021-11-26 SURGICAL SUPPLY — 35 items
APL SKNCLS STERI-STRIP NONHPOA (GAUZE/BANDAGES/DRESSINGS) ×1
BENZOIN TINCTURE PRP APPL 2/3 (GAUZE/BANDAGES/DRESSINGS) ×2 IMPLANT
CHLORAPREP W/TINT 26ML (MISCELLANEOUS) ×2 IMPLANT
CLAMP CORD UMBIL (MISCELLANEOUS) IMPLANT
CLOTH BEACON ORANGE TIMEOUT ST (SAFETY) ×2 IMPLANT
DRSG OPSITE POSTOP 4X10 (GAUZE/BANDAGES/DRESSINGS) ×2 IMPLANT
ELECT REM PT RETURN 9FT ADLT (ELECTROSURGICAL) ×2
ELECTRODE REM PT RTRN 9FT ADLT (ELECTROSURGICAL) ×1 IMPLANT
EXTRACTOR VACUUM M CUP 4 TUBE (SUCTIONS) IMPLANT
GLOVE BIO SURGEON STRL SZ7.5 (GLOVE) ×2 IMPLANT
GLOVE BIOGEL PI IND STRL 7.0 (GLOVE) ×1 IMPLANT
GLOVE BIOGEL PI IND STRL 7.5 (GLOVE) ×1 IMPLANT
GLOVE BIOGEL PI INDICATOR 7.0 (GLOVE) ×1
GLOVE BIOGEL PI INDICATOR 7.5 (GLOVE) ×1
GOWN STRL REUS W/TWL LRG LVL3 (GOWN DISPOSABLE) ×4 IMPLANT
KIT ABG SYR 3ML LUER SLIP (SYRINGE) IMPLANT
NDL HYPO 25X5/8 SAFETYGLIDE (NEEDLE) IMPLANT
NEEDLE HYPO 25X5/8 SAFETYGLIDE (NEEDLE) IMPLANT
NS IRRIG 1000ML POUR BTL (IV SOLUTION) ×2 IMPLANT
PACK C SECTION WH (CUSTOM PROCEDURE TRAY) ×2 IMPLANT
PAD OB MATERNITY 4.3X12.25 (PERSONAL CARE ITEMS) ×2 IMPLANT
PENCIL SMOKE EVAC W/HOLSTER (ELECTROSURGICAL) ×2 IMPLANT
RTRCTR C-SECT PINK 25CM LRG (MISCELLANEOUS) ×2 IMPLANT
STRIP CLOSURE SKIN 1/2X4 (GAUZE/BANDAGES/DRESSINGS) ×2 IMPLANT
SUT CHROMIC 2 0 CT 1 (SUTURE) ×2 IMPLANT
SUT MNCRL AB 3-0 PS2 27 (SUTURE) ×2 IMPLANT
SUT PLAIN 2 0 XLH (SUTURE) ×2 IMPLANT
SUT VIC AB 0 CT1 36 (SUTURE) ×2 IMPLANT
SUT VIC AB 0 CTX 36 (SUTURE) ×6
SUT VIC AB 0 CTX36XBRD ANBCTRL (SUTURE) ×3 IMPLANT
SUT VIC AB 2-0 SH 27 (SUTURE)
SUT VIC AB 2-0 SH 27XBRD (SUTURE) IMPLANT
TOWEL OR 17X24 6PK STRL BLUE (TOWEL DISPOSABLE) ×2 IMPLANT
TRAY FOLEY W/BAG SLVR 14FR LF (SET/KITS/TRAYS/PACK) ×2 IMPLANT
WATER STERILE IRR 1000ML POUR (IV SOLUTION) ×2 IMPLANT

## 2021-11-26 NOTE — Transfer of Care (Signed)
Immediate Anesthesia Transfer of Care Note  Patient: Michele Fields  Procedure(s) Performed: CESAREAN SECTION  Patient Location: PACU  Anesthesia Type:Spinal  Level of Consciousness: awake, alert  and oriented  Airway & Oxygen Therapy: Patient Spontanous Breathing  Post-op Assessment: Report given to RN and Post -op Vital signs reviewed and stable  Post vital signs: Reviewed and stable  Last Vitals:  Vitals Value Taken Time  BP 110/72 11/26/21 1108  Temp    Pulse 74 11/26/21 1111  Resp 19 11/26/21 1111  SpO2 100 % 11/26/21 1111  Vitals shown include unvalidated device data.  Last Pain:  Vitals:   11/26/21 0755  TempSrc: Oral         Complications: No notable events documented.

## 2021-11-26 NOTE — Op Note (Signed)
Cesarean Section Procedure Note  Indications: P1 at 39wks with h/o c-section presenting for repeat c-section.  Pre-operative Diagnosis: 1.39WKS 2.REPEAT CESAREAN SECTION 3.GDM    Post-operative Diagnosis: 1.39WKS 2.REPEAT CESAREAN SECTION 3.GDM   Procedure: LOW TRANSVERSE CESAREAN SECTION  Surgeon: Osborn Coho, MD    Assistants: Rhea Pink, CNM  Anesthesia: Regional  Anesthesiologist: Achille Rich, MD   Procedure Details  The patient was taken to the operating room secondary to h/o c-section after the risks, benefits, complications, treatment options, and expected outcomes were discussed with the patient.  The patient concurred with the proposed plan, giving informed consent which was signed and witnessed. The patient was taken to Operating Room B, identified as Magdalene Snethen and the procedure verified as C-Section Delivery. A Time Out was held and the above information confirmed.  After induction of anesthesia by obtaining a spinal, the patient was prepped and draped in the usual sterile manner. A Pfannenstiel skin incision was made and carried down through the subcutaneous tissue to the underlying layer of fascia.  The fascia was incised bilaterally and extended transversely bilaterally with the Mayo scissors. Kocher clamps were placed on the inferior aspect of the fascial incision and the underlying rectus muscle was separated from the fascia. The same was done on the superior aspect of the fascial incision.  The peritoneum was identified, entered bluntly and extended manually.  An Alexis self-retaining retractor was placed.  The utero-vesical peritoneal reflection was incised transversely and the bladder flap was bluntly freed from the lower uterine segment. A low transverse uterine incision was made with the scalpel and extended bilaterally with the bandage scissors.  The infant was delivered in vertex position without difficulty.  After the umbilical cord was clamped and cut, the  infant was handed to the awaiting pediatricians.  Cord blood was obtained for evaluation.  The placenta was removed intact and appeared to be within normal limits. The uterus was cleared of all clots and debris. The uterine incision was closed with running interlocking sutures of 0 Vicryl and a second imbricating layer was performed as well.   Bilateral tubes and ovaries appeared to be within normal limits.  Good hemostasis was noted.  Copious irrigation was performed until clear.  The peritoneum was repaired with 2-0 chromic via a running suture.  The fascia was reapproximated with a running suture of 0 Vicryl. The subcutaneous tissue was reapproximated with 3 interrupted sutures of 2-0 plain.  The skin was reapproximated with a subcuticular suture of 3-0 monocryl.  Steristrips were applied.  Instrument, sponge, and needle counts were correct prior to abdominal closure and at the conclusion of the case.  The patient was awaiting transfer to the recovery room in good condition.  Findings: Live female infant with Apgars 8 at one minute and 8 at five minutes.  Normal appearing bilateral ovaries and fallopian tubes were noted.  Estimated Blood Loss:  351 ml         Drains: foley to gravity 100 cc         Total IV Fluids: 1300 ml         Specimens to Pathology: none         Complications:  None; patient tolerated the procedure well.         Disposition: PACU - hemodynamically stable.         Condition: stable  Attending Attestation: I performed the procedure.  I was present and scrubbed and the assistant was required due to complexity of anatomy.

## 2021-11-26 NOTE — Anesthesia Preprocedure Evaluation (Signed)
Anesthesia Evaluation  Patient identified by MRN, date of birth, ID band Patient awake    Reviewed: Allergy & Precautions, H&P , NPO status , Patient's Chart, lab work & pertinent test results  Airway Mallampati: II   Neck ROM: full    Dental   Pulmonary former smoker,    breath sounds clear to auscultation       Cardiovascular negative cardio ROS   Rhythm:regular Rate:Normal     Neuro/Psych  Headaches,    GI/Hepatic   Endo/Other  diabetes, Gestational  Renal/GU      Musculoskeletal   Abdominal   Peds  Hematology   Anesthesia Other Findings   Reproductive/Obstetrics                             Anesthesia Physical Anesthesia Plan  ASA: 2  Anesthesia Plan: Spinal   Post-op Pain Management:    Induction: Intravenous  PONV Risk Score and Plan: 2 and Treatment may vary due to age or medical condition  Airway Management Planned: Nasal Cannula  Additional Equipment:   Intra-op Plan:   Post-operative Plan:   Informed Consent: I have reviewed the patients History and Physical, chart, labs and discussed the procedure including the risks, benefits and alternatives for the proposed anesthesia with the patient or authorized representative who has indicated his/her understanding and acceptance.     Dental advisory given  Plan Discussed with: CRNA, Anesthesiologist and Surgeon  Anesthesia Plan Comments:         Anesthesia Quick Evaluation

## 2021-11-26 NOTE — H&P (Addendum)
OB ADMISSION/ HISTORY & PHYSICAL:  Admission Date: 11/26/2021  7:44 AM  Admit Diagnosis: Repeat Cesarean Section  Michele Fields is a 25 y.o. female [id number] 64w0dpresenting for repeat C/S. Endorses active FM, denies LOF and vaginal bleeding. Pregnancy complicated by gestational DM controlled by Glyburide 2.5 mg PO BID. Hx of HSV and taking Valtrex for suppression since 36 wks, denies prodromal symptoms today.  History of current pregnancy: G4P1011  Patient entered care with [facility] at 15 wks.   EDC 12/03/21 by [place]@ 6+5 wks for uncertain LMP   Anatomy scan:  20+2 wks, complete w/ posterior placenta.   Antenatal testing: for GDM started at 32 weeks Last evaluation: 36+4  wks vertex/ posterior placenta. AFI 16.8/ BPP 8/8  Significant prenatal events:  Patient Active Problem List   Diagnosis Date Noted   Encounter for maternal care for low transverse scar from repeat cesarean delivery 11/26/2021   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   Gestational diabetes 10/20/2021   Chronic migraine 09/13/2020    Prenatal Labs: ABO, Rh: --/--/A POS (12/02 1133) Antibody: NEG (12/02 1133) Rubella: Immune (05/31 0000)  RPR: NON REACTIVE (12/02 1133)  HBsAg: Negative (05/31 0000)  HIV: Non-reactive (05/31 0000)  GTT: abnormal 3 hr gtt GBS: Negative/-- (11/14 0000)  GC/CHL: neg/neg Genetics: low-risk Tdap/influenza vaccines: declined both   OB History  Gravida Para Term Preterm AB Living  _0 0 1 1  SAB IAB Ectopic Multiple Live Births  1 0 0 0 1    # Outcome Date GA Lbr Len/2nd Weight Sex Delivery Anes PTL Lv  4 Current           3 Term 10/01/20 350w0d3605 g M CS-LTranv EPI  LIV     Birth Comments: WNL  2 SAB 01/2019          1 Gravida             Medical / Surgical History: Past medical history:  Past Medical History:  Diagnosis Date   ADHD (attention deficit hyperactivity disorder)    Bacterial vaginosis    BRCA negative 10/2019   MyRisk neg   Family history of breast cancer    Increased  risk of breast cancer 10/2019   IBIS=25.3%/riskscore=20.2%   Yeast infection     Past surgical history:  Past Surgical History:  Procedure Laterality Date   CESAREAN SECTION N/A 10/01/2020   Procedure: CESAREAN SECTION;  Surgeon: DaDrema DallasDO;  Location: [facility] LD ORS;  Service: Obstetrics;  Laterality: N/A;   CHOLECYSTECTOMY     NO PAST SURGERIES     Family History:  Family History  Problem Relation Age of Onset   Thyroid disease Paternal Grandmother    Ovarian cancer Paternal Grandmother 24   Breast cancer Maternal AuAunt 20 Breast cancer Other 62   Cervical cancer Paternal AuAunt 98  Social History:  reports that she quit smoking about 20 months ago. Her smoking use included cigarettes. She smoked an average of .5 packs per day. She has never used smokeless tobacco. She reports that she does not currently use alcohol. She reports that she does not use drugs.  Allergies: Other   Current Medications at time of admission:  Prior to Admission medications   Medication Sig Start Date End Date Taking? Authorizing Provider  acetaminophen (TYLENOL) 500 MG tablet Take 2 tablets (1,000 mg total) by mouth every 6 (six) hours. Patient taking differently: Take 1,000 mg by mouth every 6 (six) hours  as needed for mild pain or moderate pain. 10/03/20  Yes Juliene Pina, CNM  cyclobenzaprine (FLEXERIL) 10 MG tablet Take 1 tablet (10 mg total) by mouth 3 (three) times daily as needed (back pain). Patient taking differently: Take 10 mg by mouth 3 (three) times daily as needed for muscle spasms. 07/26/20  Yes Julianne Handler, CNM  glyBURIDE (DIABETA) 2.5 MG tablet Take 2.5 mg by mouth daily with breakfast.   Yes [provider]  ibuprofen (ADVIL) 800 MG tablet Take 1 tablet (800 mg total) by mouth every 8 (eight) hours. 10/03/20  Yes Derrell Lolling C, CNM  NURTEC 75 MG TBDP Take 75 mg by mouth daily as needed for migraine. 10/17/21  Yes [provider]  valACYclovir (VALTREX)  500 MG tablet Take 500 mg by mouth daily.   Yes [provider]  Elastic Bandages & Supports (COMFORT FIT MATERNITY SUPP LG) MISC 1 Units by Does not apply route daily. 06/29/20   Sparacino, Hailey L, DO  iron polysaccharides (FERREX 150) 150 MG capsule Take 1 capsule (150 mg total) by mouth daily. Patient not taking: Reported on 11/21/2021 10/03/20   Juliene Pina, CNM  lidocaine (LIDODERM) 5 % Place 1 patch onto the skin daily. Remove & Discard patch within 12 hours or as directed by MD Patient not taking: Reported on 11/21/2021 09/11/20   Ok Edwards, PA-C  Magnesium Oxide 400 (240 Mg) MG TABS Take 1 tablet (400 mg total) by mouth daily. For prevention of constipation. Patient not taking: Reported on 11/21/2021 10/03/20   Juliene Pina, CNM  Prenat w/o A Vit-FeFum-FePo-FA (CONCEPT OB) 130-92.4-1 MG CAPS Take 1 capsule by mouth daily. Patient not taking: Reported on 11/21/2021 02/29/20   Gae Dry, MD  senna-docusate (SENOKOT-S) 8.6-50 MG tablet Take 2 tablets by mouth daily. Patient not taking: Reported on 11/21/2021 10/04/20   Juliene Pina, CNM  simethicone (MYLICON) 80 MG chewable tablet Chew 1 tablet (80 mg total) by mouth as needed for flatulence. Patient not taking: Reported on 11/21/2021 10/03/20   Juliene Pina, CNM    Review of Systems: Constitutional: Negative   HENT: Negative   Eyes: Negative   Respiratory: Negative   Cardiovascular: Negative   Gastrointestinal: Negative  Genitourinary: neg for bloody show, neg for LOF   Musculoskeletal: Negative   Skin: Negative   Neurological: Negative   Endo/Heme/Allergies: Negative   Psychiatric/Behavioral: Negative    Physical Exam: VS: Blood pressure (!) 141/97, temperature (!) 97.5 F (36.4 C), temperature source Oral, resp. rate 16, height _0  (1.6 m), weight 89.4 kg, last menstrual period 02/26/2021, unknown if currently breastfeeding. AAO x3, no signs of distress Cardiovascular: RRR Respiratory: Lung  fields clear to ausculation GU/GI: Abdomen gravid, non-tender, non-distended, active FM, vertex Extremities: trace edema, negative for pain, tenderness, and cords  Cervical exam: deferred FHR doppler:   Prenatal Transfer Tool  Maternal Diabetes: Yes:  Diabetes Type:  Insulin/Medication controlled Genetic Screening: Normal Maternal Ultrasounds/Referrals: Normal Fetal Ultrasounds or other Referrals:  None Maternal Substance Abuse:  No Significant Maternal Medications:  Meds include: Other: GLyburide 2.5 mg PO daily Significant Maternal Lab Results: Group B Strep negative    Assessment: 25 y.o. [id number] [redacted]w[redacted]d Hx of previous LTCS and desires repeat C/S GBS neg Pain management plan: per anesthesia   Plan:  Admit to L&D OR Routine admission orders  Dr RMancel Baleaware of admission and plan of care  VArrie EasternMSN, CNM 11/26/2021 8:23 AM  Risks  benefits alternatives reviewed including but not limited to bleeding infection and injury.  Pt wants BC prior to discharge.  Options reviewed and she would like depo.  Questions answered and consent signed and witnessed.

## 2021-11-26 NOTE — Anesthesia Procedure Notes (Signed)
Spinal  Patient location during procedure: OR Start time: 11/26/2021 9:37 AM End time: 11/26/2021 9:39 AM Reason for block: surgical anesthesia Staffing Performed: anesthesiologist  Anesthesiologist: Achille Rich, MD Preanesthetic Checklist Completed: patient identified, IV checked, risks and benefits discussed, surgical consent, monitors and equipment checked, pre-op evaluation and timeout performed Spinal Block Patient position: sitting Prep: DuraPrep Patient monitoring: cardiac monitor, continuous pulse ox and blood pressure Approach: midline Location: L3-4 Injection technique: single-shot Needle Needle type: Pencan  Needle gauge: 24 G Needle length: 9 cm Assessment Sensory level: T10 Events: CSF return Additional Notes Functioning IV was confirmed and monitors were applied. Sterile prep and drape, including hand hygiene and sterile gloves were used. The patient was positioned and the spine was prepped. The skin was anesthetized with lidocaine.  Free flow of clear CSF was obtained prior to injecting local anesthetic into the CSF.  The spinal needle aspirated freely following injection.  The needle was carefully withdrawn.  The patient tolerated the procedure well.

## 2021-11-27 LAB — CBC
HCT: 29.2 % — ABNORMAL LOW (ref 36.0–46.0)
Hemoglobin: 9.7 g/dL — ABNORMAL LOW (ref 12.0–15.0)
MCH: 30 pg (ref 26.0–34.0)
MCHC: 33.2 g/dL (ref 30.0–36.0)
MCV: 90.4 fL (ref 80.0–100.0)
Platelets: 150 10*3/uL (ref 150–400)
RBC: 3.23 MIL/uL — ABNORMAL LOW (ref 3.87–5.11)
RDW: 13.8 % (ref 11.5–15.5)
WBC: 13.1 10*3/uL — ABNORMAL HIGH (ref 4.0–10.5)
nRBC: 0 % (ref 0.0–0.2)

## 2021-11-27 LAB — GLUCOSE, CAPILLARY: Glucose-Capillary: 109 mg/dL — ABNORMAL HIGH (ref 70–99)

## 2021-11-27 NOTE — Progress Notes (Signed)
MD Progress Note  Michele Fields [id number] POD# 1 repeat CS Information for the patient's newborn:  Michele, [name] Girl Fields [[id number]]  female   Subjective: Patient reports  some incisional pain Feeding: breast Reports tolerating PO, no N/V, ambulating and voiding w/o difficulty  Pain controlled with ibuprofen (OTC) and narcotic analgesics including oxycodone (Oxycontin, Oxyir) Denies chest pain HA/SOB/dizziness  Flatus yes Vaginal bleeding is normal, no clots   Objective:  VS:  Vitals:   11/26/21 1600 11/26/21 2009 11/27/21 0006 11/27/21 0436  BP: 114/75 113/64  104/71  Pulse: 90 65 72 76  Resp: 18  19 17   Temp: 98.5 F (36.9 C) 98.7 F (37.1 C) 98.5 F (36.9 C) 98.4 F (36.9 C)  TempSrc: Oral  Oral Oral  SpO2: 100% 99% 99% 99%  Weight:      Height:        Intake/Output Summary (Last 24 hours) at 11/27/2021 1235 Last data filed at 11/27/2021 0540 Gross per 24 hour  Intake 360 ml  Output 1385 ml  Net -1025 ml     Recent Labs    11/27/21 0454  WBC 13.1*  HGB 9.7*  HCT 29.2*  PLT 150    Blood type: --/--/A POS (12/02 1133) Rubella: Immune (05/31 0000)    Physical Exam:  General: alert, cooperative, and no distress CV: Regular rate and rhythm Resp: clear Abdomen: soft, appropriately tender Incision: clean, dry, intact, and honeycomb in place Uterine Fundus: firm, below umbilicus Lochia: none Ext: extremities normal, atraumatic, no cyanosis or edema, Homans sign is negative, no sign of DVT, and no edema, redness or tenderness in the calves or thighs   Assessment/Plan: 25 y.o.   POD# 1 s/p Repeat CS 25                  Continue routine post-op PP care          Advance diet as tolerated Advised warm fluids and ambulation to improve GI motility Breastfeeding support Anticipate D/C tomorrow   [id number], MD 11/27/2021, 12:35 PM

## 2021-11-28 MED ORDER — IBUPROFEN 600 MG PO TABS: 600.0000 mg | ORAL_TABLET | Freq: Four times a day (QID) | ORAL | 2 refills | Status: DC | PRN

## 2021-11-28 MED ORDER — MEDROXYPROGESTERONE ACETATE 150 MG/ML IM SUSP: 150.0000 mg | Freq: Once | INTRAMUSCULAR | Status: DC

## 2021-11-28 MED ORDER — OXYCODONE-ACETAMINOPHEN 5-325 MG PO TABS: 1.0000 | ORAL_TABLET | ORAL | 0 refills | Status: DC | PRN

## 2021-11-28 NOTE — Anesthesia Postprocedure Evaluation (Signed)
Anesthesia Post Note  Patient: Michele Fields  Procedure(s) Performed: CESAREAN SECTION     Patient location during evaluation: PACU Anesthesia Type: Spinal Level of consciousness: oriented and awake and alert Pain management: pain level controlled Vital Signs Assessment: post-procedure vital signs reviewed and stable Respiratory status: spontaneous breathing, respiratory function stable and patient connected to nasal cannula oxygen Cardiovascular status: blood pressure returned to baseline and stable Postop Assessment: no headache, no backache and no apparent nausea or vomiting Anesthetic complications: no   No notable events documented.  Last Vitals:  Vitals:   11/27/21 2200 11/28/21 0653  BP: 113/73 115/74  Pulse: 75 68  Resp: 18 18  Temp: 36.7 C 36.6 C  SpO2:      Last Pain:  Vitals:   11/28/21 0653  TempSrc:   PainSc: 8                  Huck Ashworth S

## 2021-11-28 NOTE — Discharge Summary (Signed)
[facility] Discharge Summary   Patient Name:   Michele Fields DOB:     [birth year] MRN:     [id number]  Date of Admission:   11/26/2021 Date of Discharge:  11/28/2021  Admitting diagnosis:    Encounter for maternal care for low transverse scar from repeat cesarean delivery [O34.211] Status post repeat low transverse cesarean section [Z98.891] Principal Problem:   Encounter for maternal care for low transverse scar from repeat cesarean delivery Active Problems:   Gestational diabetes   HSV infection   Status post repeat low transverse cesarean section     Discharge diagnosis:    Encounter for maternal care for low transverse scar from repeat cesarean delivery [O34.211] Status post repeat low transverse cesarean section [Z98.891] Principal Problem:   Encounter for maternal care for low transverse scar from repeat cesarean delivery Active Problems:   Gestational diabetes   HSV infection   Status post repeat low transverse cesarean section   Additional problems: None                                          Post partum procedures: none Augmentation: N/A Complications: None  Hospital course: Sceduled C/S   25 y.o. yo [id number] at 70w0dwas admitted to the hospital 11/26/2021 for scheduled cesarean section with the following indication:Elective Repeat.Delivery details are as follows:  Membrane Rupture Time/Date: 10:05 AM ,11/26/2021   Delivery Method:C-Section, Low Transverse  Details of operation can be found in separate operative note.  Patient had an uncomplicated postpartum course.  She is ambulating, tolerating a regular diet, passing flatus, and urinating well. Patient is discharged home in stable condition on  11/28/21        Newborn Data: Birth date:11/26/2021  Birth time:10:05 AM  Gender:Female  Living status:Living  Apgars:8 ,8  Weight:3530 g     Magnesium Sulfate received: No BMZ received: No Rhophylac:No MMR:No T-DaP:Given prenatally Flu:  No Transfusion:No                                                               Type of Delivery:  Repeat CS Delivering Provider: REverett Graff Date of Delivery:  11/26/21  Newborn Data:    Live born female  Birth Weight: 7 lb 12.5 oz (3530 g) APGAR: 819 8  Newborn Delivery   Birth date/time: 11/26/2021 10:05:00 Delivery type: C-Section, Low Transverse Trial of labor: No C-section categorization: Repeat      Baby Feeding:   Breast Disposition:   home with mother  Physical Exam:   Vitals:   11/27/21 0436 11/27/21 1625 11/27/21 2200 11/28/21 0653  BP: 104/71 115/69 113/73 115/74  Pulse: 76 75 75 68  Resp: _0 Temp: 98.4 F (36.9 C) 97.9 F (36.6 C) 98.1 F (36.7 C) 97.8 F (36.6 C)  TempSrc: Oral Oral Oral   SpO2: 99%     Weight:      Height:       General: alert, cooperative, and no distress Lochia: appropriate Uterine Fundus: firm Incision: Healing well with no significant drainage, No significant erythema, Dressing is clean, dry, and intact DVT Evaluation: No evidence of DVT seen on physical  exam. Negative Homan's sign. No cords or calf tenderness.  Labs: Lab Results  Component Value Date   WBC 13.1 (H) 11/27/2021   HGB 9.7 (L) 11/27/2021   HCT 29.2 (L) 11/27/2021   MCV 90.4 11/27/2021   PLT 150 11/27/2021   CMP Latest Ref Rng & Units 11/24/2021  Glucose 70 - 99 mg/dL 85  BUN 6 - 20 mg/dL <5(L)  Creatinine 0.44 - 1.00 mg/dL 0.52  Sodium 135 - 145 mmol/L 136  Potassium 3.5 - 5.1 mmol/L 3.8  Chloride 98 - 111 mmol/L 106  CO2 22 - 32 mmol/L 20(L)  Calcium 8.9 - 10.3 mg/dL 8.9  Total Protein 6.5 - 8.1 g/dL 6.6  Total Bilirubin 0.3 - 1.2 mg/dL 0.5  Alkaline Phos 38 - 126 U/L 109  AST 15 - 41 U/L 13(L)  ALT 0 - 44 U/L 11    Discharge instruction: per After Visit Summary and "Baby and Me Booklet".  After Visit Meds:  Allergies as of 11/28/2021       Reactions   Other Other (See Comments)   Seasonal Allergies        Medication List      TAKE these medications    acetaminophen 500 MG tablet Commonly known as: TYLENOL Take 2 tablets (1,000 mg total) by mouth every 6 (six) hours. What changed:  when to take this reasons to take this   Lorain 1 Units by Does not apply route daily.   Concept OB 130-92.4-1 MG Caps Take 1 capsule by mouth daily.   cyclobenzaprine 10 MG tablet Commonly known as: FLEXERIL Take 1 tablet (10 mg total) by mouth 3 (three) times daily as needed (back pain). What changed: reasons to take this   glyBURIDE 2.5 MG tablet Commonly known as: DIABETA Take 2.5 mg by mouth daily with breakfast.   ibuprofen 800 MG tablet Commonly known as: ADVIL Take 1 tablet (800 mg total) by mouth every 8 (eight) hours. What changed: Another medication with the same name was added. Make sure you understand how and when to take each.   ibuprofen 600 MG tablet Commonly known as: ADVIL Take 1 tablet (600 mg total) by mouth every 6 (six) hours as needed for cramping or moderate pain. What changed: You were already taking a medication with the same name, and this prescription was added. Make sure you understand how and when to take each.   iron polysaccharides 150 MG capsule Commonly known as: Ferrex 150 Take 1 capsule (150 mg total) by mouth daily.   lidocaine 5 % Commonly known as: Lidoderm Place 1 patch onto the skin daily. Remove & Discard patch within 12 hours or as directed by MD   magnesium oxide 400 (240 Mg) MG tablet Commonly known as: MAG-OX Take 1 tablet (400 mg total) by mouth daily. For prevention of constipation.   Nurtec 75 MG Tbdp Generic drug: Rimegepant Sulfate Take 75 mg by mouth daily as needed for migraine.   oxyCODONE-acetaminophen 5-325 MG tablet Commonly known as: PERCOCET/ROXICET Take 1-2 tablets by mouth every 4 (four) hours as needed for severe pain.   senna-docusate 8.6-50 MG tablet Commonly known as: Senokot-S Take 2 tablets by mouth  daily.   simethicone 80 MG chewable tablet Commonly known as: MYLICON Chew 1 tablet (80 mg total) by mouth as needed for flatulence.   valACYclovir 500 MG tablet Commonly known as: VALTREX Take 500 mg by mouth daily.        Diet: routine  diet  Activity: Advance as tolerated. Pelvic rest for 6 weeks.   Outpatient follow up:6 weeks Follow up Appt:No future appointments. Follow up visit: No follow-ups on file.  Postpartum contraception: Depo Provera  11/28/2021 Sanjuana Kava, MD

## 2021-12-11 ENCOUNTER — Telehealth (HOSPITAL_COMMUNITY): Payer: Self-pay | Admitting: *Deleted

## 2021-12-11 NOTE — Telephone Encounter (Signed)
Mom reports feeling well and baby is well. Phone disconnected when EPDS started.  Duffy Rhody, RN 12-11-2021 at 536pm

## 2022-01-25 ENCOUNTER — Ambulatory Visit: Payer: Medicaid Other

## 2022-02-05 ENCOUNTER — Ambulatory Visit
Payer: Medicaid Other | Attending: Obstetrics and Gynecology | Admitting: Rehabilitative and Restorative Service Providers"

## 2024-03-04 LAB — OB RESULTS CONSOLE ANTIBODY SCREEN: Antibody Screen: NEGATIVE

## 2024-03-04 LAB — OB RESULTS CONSOLE RUBELLA ANTIBODY, IGM: Rubella: IMMUNE

## 2024-03-04 LAB — OB RESULTS CONSOLE RPR: RPR: NONREACTIVE

## 2024-03-04 LAB — OB RESULTS CONSOLE HEPATITIS B SURFACE ANTIGEN: Hepatitis B Surface Ag: NEGATIVE

## 2024-03-04 LAB — OB RESULTS CONSOLE HIV ANTIBODY (ROUTINE TESTING): HIV: NONREACTIVE

## 2024-03-04 LAB — HEPATITIS C ANTIBODY: HCV Ab: NEGATIVE

## 2024-03-23 ENCOUNTER — Inpatient Hospital Stay (HOSPITAL_COMMUNITY)
Admission: AD | Admit: 2024-03-23 | Discharge: 2024-03-24 | Disposition: A | Discharge status: Home / Self Care | Attending: Family Medicine | Admitting: Family Medicine

## 2024-03-23 ENCOUNTER — Encounter (HOSPITAL_COMMUNITY): Payer: Self-pay | Admitting: Obstetrics and Gynecology

## 2024-03-23 DIAGNOSIS — O210 Mild hyperemesis gravidarum: Secondary | ICD-10-CM

## 2024-03-23 DIAGNOSIS — Z8632 Personal history of gestational diabetes: Secondary | ICD-10-CM | POA: Diagnosis not present

## 2024-03-23 DIAGNOSIS — R519 Headache, unspecified: Secondary | ICD-10-CM | POA: Diagnosis present

## 2024-03-23 DIAGNOSIS — N858 Other specified noninflammatory disorders of uterus: Secondary | ICD-10-CM | POA: Insufficient documentation

## 2024-03-23 DIAGNOSIS — Z3A12 12 weeks gestation of pregnancy: Secondary | ICD-10-CM | POA: Insufficient documentation

## 2024-03-23 DIAGNOSIS — O34219 Maternal care for unspecified type scar from previous cesarean delivery: Secondary | ICD-10-CM | POA: Diagnosis not present

## 2024-03-23 DIAGNOSIS — O09291 Supervision of pregnancy with other poor reproductive or obstetric history, first trimester: Secondary | ICD-10-CM | POA: Insufficient documentation

## 2024-03-23 DIAGNOSIS — O219 Vomiting of pregnancy, unspecified: Secondary | ICD-10-CM | POA: Insufficient documentation

## 2024-03-23 LAB — URINALYSIS, ROUTINE W REFLEX MICROSCOPIC
Bilirubin Urine: NEGATIVE
Glucose, UA: NEGATIVE mg/dL
Hgb urine dipstick: NEGATIVE
Ketones, ur: 80 mg/dL — AB
Nitrite: NEGATIVE
Protein, ur: 30 mg/dL — AB
Specific Gravity, Urine: 1.03 (ref 1.005–1.030)
pH: 5 (ref 5.0–8.0)

## 2024-03-23 LAB — POCT PREGNANCY, URINE: Preg Test, Ur: POSITIVE — AB

## 2024-03-23 MED ORDER — SCOPOLAMINE 1 MG/3DAYS TD PT72
1.0000 | MEDICATED_PATCH | TRANSDERMAL | Status: DC
  Administered 2024-03-23: 1.5 mg via TRANSDERMAL
  Filled 2024-03-23: qty 1

## 2024-03-23 MED ORDER — LACTATED RINGERS IV BOLUS: 1000.0000 mL | Freq: Once | INTRAVENOUS | Status: DC

## 2024-03-23 MED ORDER — ONDANSETRON HCL 4 MG/2ML IJ SOLN
4.0000 mg | Freq: Once | INTRAMUSCULAR | Status: AC
  Administered 2024-03-23: 4 mg via INTRAVENOUS
  Filled 2024-03-23: qty 2

## 2024-03-23 MED ORDER — ONDANSETRON 4 MG PO TBDP: 4.0000 mg | ORAL_TABLET | Freq: Three times a day (TID) | ORAL | 0 refills | Status: DC | PRN

## 2024-03-23 MED ORDER — FAMOTIDINE IN NACL 20-0.9 MG/50ML-% IV SOLN
20.0000 mg | Freq: Once | INTRAVENOUS | Status: AC
  Administered 2024-03-23: 20 mg via INTRAVENOUS
  Filled 2024-03-23: qty 50

## 2024-03-23 MED ORDER — PROMETHAZINE HCL 25 MG PO TABS: 25.0000 mg | ORAL_TABLET | Freq: Four times a day (QID) | ORAL | 0 refills | Status: DC | PRN

## 2024-03-23 NOTE — MAU Provider Note (Signed)
 History     [id number]  Arrival date and time: 03/23/24 1900    Chief Complaint  Patient presents with   Emesis   Headache     HPI Breyon Evora is a 28 y.o. at [redacted]w[redacted]d with PMHx notable for GDM in prior pregnancy, cesarean x2, migraine, who presents for nausea and vomiting.   Patient reports intense nausea and vomiting that has been terrible over the past two weeks but has intensified in the past 24 hours Has not been able to eat or drink over this time period Has been to OBGYN office several times and has been uptitrated on zofran to 8 mg PRN  Last took a dose this morning but immediately threw it up Feeling drained and unwell No abdominal pain or vaginal bleeding Has had similar episodes in prior pregnancies but never this bad     OB History     Gravida  5   Para  2   Term  2   Preterm  0   AB  1   Living  2      SAB  1   IAB  0   Ectopic  0   Multiple  0   Live Births  2           Past Medical History:  Diagnosis Date   ADHD (attention deficit hyperactivity disorder)    Bacterial vaginosis    BRCA negative 10/2019   MyRisk neg   Family history of breast cancer    Increased risk of breast cancer 10/2019   IBIS=25.3%/riskscore=20.2%   Yeast infection     Past Surgical History:  Procedure Laterality Date   CESAREAN SECTION N/A 10/01/2020   Procedure: CESAREAN SECTION;  Surgeon: Steva Ready, DO;  Location: [facility] ORS;  Service: Obstetrics;  Laterality: N/A;   CESAREAN SECTION N/A 11/26/2021   Procedure: CESAREAN SECTION;  Surgeon: Osborn Coho, MD;  Location: [facility] ORS;  Service: Obstetrics;  Laterality: N/A;   CHOLECYSTECTOMY     NO PAST SURGERIES      Family History  Problem Relation Age of Onset   Thyroid disease Paternal Grandmother    Ovarian cancer Paternal Grandmother 24   Breast cancer Maternal Aunt 32   Breast cancer Other 62   Cervical cancer Paternal Aunt 5    Social History   Socioeconomic History   Marital  status: Single    Spouse name: Not on file   Number of children: Not on file   Years of education: Not on file   Highest education level: Not on file  Occupational History   Not on file  Tobacco Use   Smoking status: Former    Current packs/day: 0.00    Types: Cigarettes    Quit date: 03/24/2020    Years since quitting: 4.0   Smokeless tobacco: Never  Vaping Use   Vaping status: Former  Substance and Sexual Activity   Alcohol use: Not Currently   Drug use: No   Sexual activity: Not Currently    Birth control/protection: None  Other Topics Concern   Not on file  Social History Narrative   Not on file   Social Drivers of Health   Financial Resource Strain: Low Risk  (03/09/2023)   Received from [facility]   Overall Financial Resource Strain (CARDIA)    Difficulty of Paying Living Expenses: Not very hard  Food Insecurity: No Food Insecurity (10/20/2021)   Hunger Vital Sign    Worried About Running Out of  Food in the Last Year: Never true    Ran Out of Food in the Last Year: Never true  Transportation Needs: Not on file  Physical Activity: Insufficiently Active (11/18/2017)   Exercise Vital Sign    Days of Exercise per Week: 2 days    Minutes of Exercise per Session: 60 min  Stress: Patient Declined (03/09/2023)   Received from [facility] - Occupational Stress Questionnaire    Feeling of Stress : Patient declined  Social Connections: Unknown (06/04/2022)   Received from [facility]   Social Network    Social Network: Not on file  Intimate Partner Violence: Not At Risk (08/22/2023)   Received from [facility]   HITS    Over the last 12 months how often did your partner physically hurt you?: Never    Over the last 12 months how often did your partner insult you or talk down to you?: Never    Over the last 12 months how often did your partner threaten you with physical harm?: Never    Over the last 12 months how often did  your partner scream or curse at you?: Never    Allergies  Allergen Reactions   Other Other (See Comments)    Seasonal Allergies    No current facility-administered medications on file prior to encounter.   Current Outpatient Medications on File Prior to Encounter  Medication Sig Dispense Refill   acetaminophen (TYLENOL) 500 MG tablet Take 2 tablets (1,000 mg total) by mouth every 6 (six) hours. (Patient taking differently: Take 1,000 mg by mouth every 6 (six) hours as needed for mild pain or moderate pain.) 30 tablet 0   cyclobenzaprine (FLEXERIL) 10 MG tablet Take 1 tablet (10 mg total) by mouth 3 (three) times daily as needed (back pain). (Patient taking differently: Take 10 mg by mouth 3 (three) times daily as needed for muscle spasms.) 30 tablet 0   Elastic Bandages & Supports (COMFORT FIT MATERNITY SUPP LG) MISC 1 Units by Does not apply route daily. 1 each 0   glyBURIDE (DIABETA) 2.5 MG tablet Take 2.5 mg by mouth daily with breakfast.     ibuprofen (ADVIL) 600 MG tablet Take 1 tablet (600 mg total) by mouth every 6 (six) hours as needed for cramping or moderate pain. 60 tablet 2   ibuprofen (ADVIL) 800 MG tablet Take 1 tablet (800 mg total) by mouth every 8 (eight) hours. 30 tablet 0   iron polysaccharides (FERREX 150) 150 MG capsule Take 1 capsule (150 mg total) by mouth daily. (Patient not taking: Reported on 11/21/2021)     lidocaine (LIDODERM) 5 % Place 1 patch onto the skin daily. Remove & Discard patch within 12 hours or as directed by MD (Patient not taking: Reported on 11/21/2021) 30 patch 0   Magnesium Oxide 400 (240 Mg) MG TABS Take 1 tablet (400 mg total) by mouth daily. For prevention of constipation. (Patient not taking: Reported on 11/21/2021) 30 tablet    NURTEC 75 MG TBDP Take 75 mg by mouth daily as needed for migraine.     oxyCODONE-acetaminophen (PERCOCET/ROXICET) 5-325 MG tablet Take 1-2 tablets by mouth every 4 (four) hours as needed for severe pain. 40 tablet 0    Prenat w/o A Vit-FeFum-FePo-FA (CONCEPT OB) 130-92.4-1 MG CAPS Take 1 capsule by mouth daily. (Patient not taking: Reported on 11/21/2021) 30 capsule 11   senna-docusate (SENOKOT-S) 8.6-50 MG tablet Take 2 tablets by mouth daily. (Patient  not taking: Reported on 11/21/2021)     simethicone (MYLICON) 80 MG chewable tablet Chew 1 tablet (80 mg total) by mouth as needed for flatulence. (Patient not taking: Reported on 11/21/2021) 30 tablet 0   valACYclovir (VALTREX) 500 MG tablet Take 500 mg by mouth daily.       ROS Pertinent positives and negative per HPI, all others reviewed and negative  Physical Exam   BP 116/83 (BP Location: Right Arm)   Pulse (!) 113   Temp 99 F (37.2 C) (Oral)   Resp 14   Ht 5\' 3"  (1.6 m)   Wt 74.8 kg   LMP 12/22/2023   BMI 29.23 kg/m   Patient Vitals for the past 24 hrs:  BP Temp Temp src Pulse Resp Height Weight  03/23/24 1958 116/83 99 F (37.2 C) Oral (!) 113 14 5\' 3"  (1.6 m) 74.8 kg    Physical Exam Vitals reviewed.  Constitutional:      General: She is not in acute distress.    Appearance: She is well-developed. She is not diaphoretic.  HENT:     Mouth/Throat:     Mouth: Mucous membranes are dry.  Eyes:     General: No scleral icterus. Pulmonary:     Effort: Pulmonary effort is normal. No respiratory distress.  Skin:    General: Skin is warm and dry.  Neurological:     Mental Status: She is alert.     Coordination: Coordination normal.      Cervical Exam    Bedside Ultrasound Not performed.  My interpretation: n/a  FHT 162 bpm by doppler  Labs Results for orders placed or performed during the hospital encounter of 03/23/24 (from the past 24 hours)  Pregnancy, urine POC     Status: Abnormal   Collection Time: 03/23/24  7:32 PM  Result Value Ref Range   Preg Test, Ur POSITIVE (A) NEGATIVE    Imaging No results found.  MAU Course  Procedures Lab Orders         Urinalysis, Routine w reflex microscopic -Urine, Clean  Catch         Comprehensive metabolic panel with GFR         Pregnancy, urine POC    Meds ordered this encounter  Medications   lactated ringers bolus 1,000 mL   ondansetron (ZOFRAN) injection 4 mg   scopolamine (TRANSDERM-SCOP) 1 MG/3DAYS 1.5 mg   famotidine (PEPCID) IVPB 20 mg premix   Imaging Orders  No imaging studies ordered today    MDM Moderate (Level 3-4)  Assessment and Plan  #Nausea and vomiting of pregnancy #[redacted] weeks gestation of pregnancy Patient with dry mucous membranes on exam, appears unwell. Ordered for CMP, 1L LR, scopolamine patch, IV pepcid, and IV zofran.   #FWB FHR 162 bpm by doppler   Dispo:  pending, signed out to Raelyn Mora at change of shift     Venora Maples, MD/MPH 03/23/24 8:57 PM

## 2024-03-23 NOTE — MAU Note (Signed)
 Pt says had been vomit as morning sickness - was vomiting less until yesterday . Office RX- Zofran 8 mg- helped some.  Worse since  7pm yesterday .  Zofran - took at 45 Noon today. Last time vomited was 7pm.  [facility]NC- [facility]  No pain Has H/A - 9/10- started yesterday .  Children had flu 3 weeks ago - well now.  Thinks she has fever- hasn't checked Temp

## 2024-03-23 NOTE — Discharge Instructions (Signed)

## 2024-03-26 LAB — OB RESULTS CONSOLE GC/CHLAMYDIA
Chlamydia: NEGATIVE
Neisseria Gonorrhea: NEGATIVE

## 2024-05-30 ENCOUNTER — Inpatient Hospital Stay (HOSPITAL_COMMUNITY)
Admission: AD | Admit: 2024-05-30 | Discharge: 2024-05-30 | Disposition: A | Discharge status: Home / Self Care | Attending: Family Medicine | Admitting: Family Medicine

## 2024-05-30 ENCOUNTER — Encounter (HOSPITAL_COMMUNITY): Payer: Self-pay | Admitting: Obstetrics and Gynecology

## 2024-05-30 DIAGNOSIS — O26892 Other specified pregnancy related conditions, second trimester: Secondary | ICD-10-CM | POA: Insufficient documentation

## 2024-05-30 DIAGNOSIS — O99891 Other specified diseases and conditions complicating pregnancy: Secondary | ICD-10-CM | POA: Diagnosis not present

## 2024-05-30 DIAGNOSIS — R102 Pelvic and perineal pain: Secondary | ICD-10-CM | POA: Diagnosis present

## 2024-05-30 DIAGNOSIS — Z113 Encounter for screening for infections with a predominantly sexual mode of transmission: Secondary | ICD-10-CM | POA: Diagnosis present

## 2024-05-30 DIAGNOSIS — Z3A22 22 weeks gestation of pregnancy: Secondary | ICD-10-CM

## 2024-05-30 DIAGNOSIS — M7918 Myalgia, other site: Secondary | ICD-10-CM

## 2024-05-30 LAB — WET PREP, GENITAL
Clue Cells Wet Prep HPF POC: NONE SEEN
Sperm: NONE SEEN
Trich, Wet Prep: NONE SEEN
WBC, Wet Prep HPF POC: <10 (ref <10)
Yeast Wet Prep HPF POC: NONE SEEN

## 2024-05-30 LAB — COMPREHENSIVE METABOLIC PANEL WITH GFR
ALT: 10 U/L (ref 0–44)
AST: 11 U/L — ABNORMAL LOW (ref 15–41)
Albumin: 3.1 g/dL — ABNORMAL LOW (ref 3.5–5.0)
Alkaline Phosphatase: 39 U/L (ref 38–126)
Anion gap: 8 (ref 5–15)
BUN: 6 mg/dL (ref 6–20)
CO2: 20 mmol/L — ABNORMAL LOW (ref 22–32)
Calcium: 8.6 mg/dL — ABNORMAL LOW (ref 8.9–10.3)
Chloride: 108 mmol/L (ref 98–111)
Creatinine, Ser: 0.48 mg/dL (ref 0.44–1.00)
GFR, Estimated: >60 mL/min (ref >60)
Glucose, Bld: 97 mg/dL (ref 70–99)
Potassium: 3.9 mmol/L (ref 3.5–5.1)
Sodium: 136 mmol/L (ref 135–145)
Total Bilirubin: 0.4 mg/dL (ref 0.0–1.2)
Total Protein: 6.2 g/dL — ABNORMAL LOW (ref 6.5–8.1)

## 2024-05-30 LAB — CBC
HCT: 32.4 % — ABNORMAL LOW (ref 36.0–46.0)
Hemoglobin: 11.1 g/dL — ABNORMAL LOW (ref 12.0–15.0)
MCH: 31 pg (ref 26.0–34.0)
MCHC: 34.3 g/dL (ref 30.0–36.0)
MCV: 90.5 fL (ref 80.0–100.0)
Platelets: 188 10*3/uL (ref 150–400)
RBC: 3.58 MIL/uL — ABNORMAL LOW (ref 3.87–5.11)
RDW: 13.5 % (ref 11.5–15.5)
WBC: 13.5 10*3/uL — ABNORMAL HIGH (ref 4.0–10.5)
nRBC: 0 % (ref 0.0–0.2)

## 2024-05-30 LAB — URINALYSIS, ROUTINE W REFLEX MICROSCOPIC
Bilirubin Urine: NEGATIVE
Glucose, UA: NEGATIVE mg/dL
Hgb urine dipstick: NEGATIVE
Ketones, ur: NEGATIVE mg/dL
Leukocytes,Ua: NEGATIVE
Nitrite: NEGATIVE
Protein, ur: NEGATIVE mg/dL
Specific Gravity, Urine: 1.023 (ref 1.005–1.030)
pH: 6 (ref 5.0–8.0)

## 2024-05-30 LAB — FETAL FIBRONECTIN: Fetal Fibronectin: NEGATIVE

## 2024-05-30 LAB — LIPASE, BLOOD: Lipase: 34 U/L (ref 11–51)

## 2024-05-30 MED ORDER — OXYCODONE-ACETAMINOPHEN 5-325 MG PO TABS: 1.0000 | ORAL_TABLET | Freq: Four times a day (QID) | ORAL | 0 refills | Status: DC | PRN

## 2024-05-30 MED ORDER — ONDANSETRON 4 MG PO TBDP
4.0000 mg | ORAL_TABLET | Freq: Once | ORAL | Status: AC
  Administered 2024-05-30: 4 mg via ORAL
  Filled 2024-05-30: qty 1

## 2024-05-30 MED ORDER — IBUPROFEN 600 MG PO TABS: 600.0000 mg | ORAL_TABLET | Freq: Four times a day (QID) | ORAL | 0 refills | Status: DC | PRN

## 2024-05-30 MED ORDER — KETOROLAC TROMETHAMINE 60 MG/2ML IM SOLN
60.0000 mg | Freq: Once | INTRAMUSCULAR | Status: AC
  Administered 2024-05-30: 60 mg via INTRAMUSCULAR
  Filled 2024-05-30: qty 2

## 2024-05-30 MED ORDER — CYCLOBENZAPRINE HCL 5 MG PO TABS
10.0000 mg | ORAL_TABLET | Freq: Once | ORAL | Status: AC
  Administered 2024-05-30: 10 mg via ORAL
  Filled 2024-05-30: qty 2

## 2024-05-30 MED ORDER — OXYCODONE-ACETAMINOPHEN 5-325 MG PO TABS
2.0000 | ORAL_TABLET | Freq: Once | ORAL | Status: AC
  Administered 2024-05-30: 2 via ORAL
  Filled 2024-05-30: qty 2

## 2024-05-30 NOTE — MAU Note (Signed)
 Michele Fields is a 28 y.o. at [redacted]w[redacted]d here in MAU reporting: feeling lower abdominal pain and vaginal pressure for 4-5 days and it increased this am. Patient crying in triage. Denies any LOF or VB. Reports +FM Denies any recent sexual intercourse. Denies any vaginal discharge, odor or itching. Recent BM this am. Denies any urinary symptoms. Reports uncomplicated pregnancy so far. Fetus is breech.   Onset of complaint: 4-5 days worse today  Pain score: 10 Vitals:   05/30/24 1018  BP: 136/72  Pulse: (!) 110  Resp: 19  Temp: 99 F (37.2 C)     ION:GEXBMWU dress, reports +FM Lab orders placed from triage:  UA

## 2024-05-30 NOTE — MAU Provider Note (Cosign Needed Addendum)
 History     CSN: [id number]  Arrival date and time: 05/30/24 1009   None     Chief Complaint  Patient presents with   Abdominal Pain   vaginal pressure   Michele Fields is a 28 y.o. year old G64P2022 female at [redacted]w[redacted]d weeks gestation who presents to MAU reporting ongoing pelvic pain. The pain started a few days ago but got significantly worse this morning. The pain starts at her pubic bone and radiates to the vagina, lower back, and down her right leg. It's constant and she rates it 10/10. The pain is aggravated by walking and is not alleviated by position changes. 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 She reports +FM, denies any vaginal bleeding or LOF.    Abdominal Pain    OB History     Gravida  5   Para  2   Term  2   Preterm  0   AB  2   Living  2      SAB  2   IAB  0   Ectopic  0   Multiple  0   Live Births  2           Past Medical History:  Diagnosis Date   ADHD (attention deficit hyperactivity disorder)    Bacterial vaginosis    BRCA negative 10/2019   MyRisk neg   Family history of breast cancer    Increased risk of breast cancer 10/2019   IBIS=25.3%/riskscore=20.2%   Yeast infection     Past Surgical History:  Procedure Laterality Date   CESAREAN SECTION N/A 10/01/2020   Procedure: CESAREAN SECTION;  Surgeon: Meldon Sport, DO;  Location: [facility];  Service: Obstetrics;  Laterality: N/A;   CESAREAN SECTION N/A 11/26/2021   Procedure: CESAREAN SECTION;  Surgeon: Renea Carrion, MD;  Location: [facility];  Service: Obstetrics;  Laterality: N/A;   CHOLECYSTECTOMY     NO PAST SURGERIES      Family History  Problem Relation Age of Onset   Thyroid  disease Paternal Grandmother    Ovarian cancer Paternal Grandmother 25   Breast cancer Maternal Aunt 32   Breast cancer Other 62   Cervical cancer Paternal Aunt 31    Social History   Tobacco Use   Smoking status: Former     Current packs/day: 0.00    Types: Cigarettes    Quit date: 03/24/2020    Years since quitting: 4.1   Smokeless tobacco: Never  Vaping Use   Vaping status: Former  Substance Use Topics   Alcohol use: Not Currently   Drug use: No    Allergies:  Allergies  Allergen Reactions   Other Other (See Comments)    Seasonal Allergies    No medications prior to admission.    Review of Systems  Gastrointestinal:  Positive for abdominal pain.  All other systems reviewed and are negative.  Physical Exam   Blood pressure 104/61, pulse 77, temperature (P) 98 F (36.7 C), temperature source (P) Oral, resp. rate (P) 16, height 5\' 3"  (1.6 m), weight 80.1 kg, last menstrual period 12/22/2023, SpO2 (P) 100%, unknown if currently breastfeeding.   Physical Exam Vitals and nursing note reviewed. Exam conducted with a chaperone present.  HENT:     Head: Normocephalic.  Cardiovascular:     Rate and Rhythm: Normal rate.  Pulmonary:     Effort: Pulmonary effort is normal.  Abdominal:  Palpations: Abdomen is soft.  Genitourinary:    Cervix: Dilated.     Comments: 0.5cm Neurological:     Mental Status: She is alert and oriented to person, place, and time.  Psychiatric:        Mood and Affect: Affect is tearful.        Behavior: Behavior is cooperative.     MAU Course  Procedures  MDM Results for orders placed or performed during the hospital encounter of 05/30/24 (from the past 24 hours)  Fetal fibronectin     Status: None   Collection Time: 05/30/24 10:49 AM  Result Value Ref Range   Fetal Fibronectin NEGATIVE NEGATIVE  Wet prep, genital     Status: None   Collection Time: 05/30/24 10:49 AM  Result Value Ref Range   Yeast Wet Prep HPF POC NONE SEEN NONE SEEN   Trich, Wet Prep NONE SEEN NONE SEEN   Clue Cells Wet Prep HPF POC NONE SEEN NONE SEEN   WBC, Wet Prep HPF POC <10 <10   Sperm NONE SEEN   Urinalysis, Routine w reflex microscopic -Urine, Clean Catch     Status: Abnormal    Collection Time: 05/30/24 10:49 AM  Result Value Ref Range   Color, Urine YELLOW YELLOW   APPearance HAZY (A) CLEAR   Specific Gravity, Urine 1.023 1.005 - 1.030   pH 6.0 5.0 - 8.0   Glucose, UA NEGATIVE NEGATIVE mg/dL   Hgb urine dipstick NEGATIVE NEGATIVE   Bilirubin Urine NEGATIVE NEGATIVE   Ketones, ur NEGATIVE NEGATIVE mg/dL   Protein, ur NEGATIVE NEGATIVE mg/dL   Nitrite NEGATIVE NEGATIVE   Leukocytes,Ua NEGATIVE NEGATIVE  CBC     Status: Abnormal   Collection Time: 05/30/24  1:57 PM  Result Value Ref Range   WBC 13.5 (H) 4.0 - 10.5 K/uL   RBC 3.58 (L) 3.87 - 5.11 MIL/uL   Hemoglobin 11.1 (L) 12.0 - 15.0 g/dL   HCT 09.8 (L) 11.9 - 14.7 %   MCV 90.5 80.0 - 100.0 fL   MCH 31.0 26.0 - 34.0 pg   MCHC 34.3 30.0 - 36.0 g/dL   RDW 82.9 56.2 - 13.0 %   Platelets 188 150 - 400 K/uL   nRBC 0.0 0.0 - 0.2 %  Comprehensive metabolic panel     Status: Abnormal   Collection Time: 05/30/24  1:57 PM  Result Value Ref Range   Sodium 136 135 - 145 mmol/L   Potassium 3.9 3.5 - 5.1 mmol/L   Chloride 108 98 - 111 mmol/L   CO2 20 (L) 22 - 32 mmol/L   Glucose, Bld 97 70 - 99 mg/dL   BUN 6 6 - 20 mg/dL   Creatinine, Ser 8.65 0.44 - 1.00 mg/dL   Calcium 8.6 (L) 8.9 - 10.3 mg/dL   Total Protein 6.2 (L) 6.5 - 8.1 g/dL   Albumin 3.1 (L) 3.5 - 5.0 g/dL   AST 11 (L) 15 - 41 U/L   ALT 10 0 - 44 U/L   Alkaline Phosphatase 39 38 - 126 U/L   Total Bilirubin 0.4 0.0 - 1.2 mg/dL   GFR, Estimated >78 >46 mL/min   Anion gap 8 5 - 15  Lipase, blood     Status: None   Collection Time: 05/30/24  1:57 PM  Result Value Ref Range   Lipase 34 11 - 51 U/L    Flexeril  10mg  PO given. Pain not improved.  Toradol  60mg  IM given, Pain from 10/10 to 9/10  Pain  is not well controlled with conservative measures.   Percocet 5-325mg  2 tabs given. Pain reduced to 7/10.   Assessment and Plan  1. 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 [redacted] weeks gestation of pregnancy - No PTL or acute  abdomen, labs and VS stable - Rx written for pain medication. - Patient to follow up with her provider for ongoing pain management. - Consider abdominal binder applied to the hips and a PT referral  Allergies as of 05/30/2024       Reactions   Other Other (See Comments)   Seasonal Allergies        Medication List     STOP taking these medications    acetaminophen  500 MG tablet Commonly known as: TYLENOL    Concept OB  130-92.4-1 MG Caps   senna-docusate 8.6-50 MG tablet Commonly known as: Senokot-S   simethicone  80 MG chewable tablet Commonly known as: MYLICON       TAKE these medications    cyclobenzaprine  10 MG tablet Commonly known as: FLEXERIL  Take 1 tablet (10 mg total) by mouth 3 (three) times daily as needed (back pain).   glyBURIDE 2.5 MG tablet Commonly known as: DIABETA Take 2.5 mg by mouth daily with breakfast.   ibuprofen  600 MG tablet Commonly known as: ADVIL  Take 1 tablet (600 mg total) by mouth every 6 (six) hours as needed.   iron  polysaccharides 150 MG capsule Commonly known as: Ferrex 150 Take 1 capsule (150 mg total) by mouth daily.   ondansetron  4 MG disintegrating tablet Commonly known as: ZOFRAN -ODT Take 1 tablet (4 mg total) by mouth every 8 (eight) hours as needed.   ondansetron  8 MG tablet Commonly known as: ZOFRAN  Take by mouth every 8 (eight) hours as needed for nausea or vomiting.   oxyCODONE -acetaminophen  5-325 MG tablet Commonly known as: PERCOCET/ROXICET Take 1-2 tablets by mouth every 6 (six) hours as needed for severe pain (pain score 7-10).   promethazine  25 MG tablet Commonly known as: PHENERGAN  Take 1 tablet (25 mg total) by mouth every 6 (six) hours as needed.   valACYclovir 500 MG tablet Commonly known as: VALTREX Take 500 mg by mouth daily.         Wilford Hanks 05/30/2024, 5:10 PM

## 2024-05-30 NOTE — Discharge Instructions (Signed)
Source: [facility]  Seven Things to Know About the Pain from Pubic Symphysis By: Jeananne Rama, PT, DPT     Today we welcome guest writer, Jeananne Rama, PT,  DPT, a physical therapist specializing in pelvic floor health for the childbearing year.  I am grateful that the topic of pelvic floor health during pregnancy and postpartum is being discussed, as many people suffer in silence and without helpful resources. Greggory Stallion,  Medical sales representative, [employer]  Introduction Joint pain is a very common physical challenge resulting from pregnancy. In particular, pelvic joint pain affects 16 to 25% of pregnancies, with onset anywhere from the first to third trimester (Kanakaris, 2011). This article will give you answers to the seven most common questions childbirth educators get about pain in the pubic symphysis--the joint at the very front of the pelvis that expands as pregnancy progresses.    1. What does pubic symphysis pain feel like? Sometimes pubic symphysis pain can feel like a slight pinch or ache. Other times it hurts so much someone will not want to walk. In certain cases, the pain will not be over the pubic symphysis, but in the creases of the groins or along the inner thighs. Occasionally, pain is only felt on one side of the body. Pubic symphysis joint pain is commonly provoked by moving the legs apart, such as getting in and out of a car, climbing out of bed, rolling in bed, or going up and down stairs. Standing up from prolonged sitting, particularly on a soft couch, is another known trigger.  2. Do I need to tell my midwife or doctor about my pain? Yes! Any time you have notable pain, you should be directed to a knowledgeable medical provider for diagnosis. The medical term for problematic pubic symphysis pain is symphysis pubis dysfunction (SPD).  3. Is pubic symphysis pain treatable? Yes! There are a number of at-home treatment options including rest, bracing, and following  self-help tips. Many people with pubic symphysis joint pain in pregnancy also benefit from physical therapy, massage therapy, acupuncture, or chiropractic care. Here's a list of self-help tips:   Walking: Walk with shorter steps to decrease pubic symphysis irritation. When carrying items, wear them in a backpack or hold them close to the chest to avoid uneven loading of the pelvis causing pain.    Sleep: Keep a pillow between the legs that spans from the knees to the ankles (not just between the knees).   Rolling over in bed: To go from one side to the other, (1) bend knees to 90 degrees, (2) tense the belly gently for stability (sometimes doing a Kegel helps too!), and (3) roll with knees, hips, and shoulders in line like a log. Avoid flopping side to side in bed or letting the spine twist.   Dressing: Sit down on the edge of the bed or on a chair to get dressed. Standing on one leg can irritate the pubic symphysis.   Stairs: Face the railing and try going up stairs stepping to the side. Alternatively, take the steps one at a time to avoid a large movement with the pelvis.   Car: When getting in a car, turn their back to the seat and aim their bottom in. Once seated, keep the knees close together as they slowly turn to face forward. Getting out is the same process in reverse. Keeping the knees close together helps prevent the pubic symphysis from feeling pulled on.   Sitting: Rest as evenly as  possible on their sit bones. Consider putting a pillow behind the back for support. Avoid slumping or leaning on one sit bone more than the other. Sitting on a large exercise ball (about 65 cm) may offer relief of pain. Avoid sitting cross legged on the floor.   Sexual activities: Aim to keep the knees together as much as possible. This may require some creativity for positioning (think spooning or using a chair).   4. Are support belts a good idea?  Most, but not all, people with SPD feel less pain and  have an easier time walking while wearing a support belt. One of the most common support belts for SPD treatment is the Serola Sacroiliac Belt. Another common support belt is the Upsie Belly. Many support belts can be purchased using a Health Savings Account (HSA) or Flexible Health Savings (FSA). Some people use a support belt just when they need more support, such as for a walk around the neighborhood. Other people wear the belt 23 hours per day, just taking it off to shower. Many people take them off for sitting. Sometimes people find that they need help from a physical therapist or chiropractor to adjust the pubic bones before splinting them with a brace.   5. What should I expect during birth? It is safe to labor with pain in the pubic symphysis joints, and this pain does not change the physiological process of birth. However, many birthing people find it helpful to adjust their positioning in labor and delivery to manage discomfort in the pubic symphysis joint. While going through contractions, positions that take weight off the pelvis may feel better. These positions include resting the upper body on an exercise ball, slow dance, being in a tub or pool, and sidelying. During pushing, hands and knees and sidelying are frequently more comfortable positions. Common positions to avoid if possible include one or both knees to chest, lunging, and climbing stairs.   6. What should I expect after I give birth?  Right after birth, the pubic symphysis pain may suddenly go away or it may feel worse from the physical rigors of labor. The first few days postpartum are often the most challenging, and a walker or wheelchair may be needed temporarily. Wearing a support belt is often helpful. If SPD is treated, most people get back to an active lifestyle by three to four months postpartum.   7. What about a future pregnancy? Joint pain that occurs during one pregnancy is worse during a subsequent pregnancy, unless  the underlying risk factors are addressed (Kanakaris, 2011). Early use of interventions to manage pain, such as a support belt or targeted exercise, can lead to a more satisfying pregnancy experience overall. On occasion, a person who experienced a traumatic pubic symphysis injury during birth will choose an elective cesarean for the following birth. A cesarean is not required in these cases, but it can help the birthing person to feel more in control and to avoid a triggering experience.     Resources Websites   [facility] (https://www.babycentre.co.uk/a546492/pelvic-pain-in-pregnancy-spd)  [place]'s National Health Service (https://www.nhs.uk/pregnancy/related-conditions/common-symptoms/pelvic-pain/)  At [facility], we have pelvic floor/female physical therapy specialists available. Ask your midwife or doctor.    Or here are other sites to search for therapists:   American Physical Therapy Associations (https://ptl.womenshealthapta.org).   Global Pelvic Health Alliance (https://pelvicguru.com)  Kanakaris, N. K., Roberts, C. S., & Giannoudis, P. V. (2011). Pregnancy-related pelvic girdle pain: an update. BMC Medicine, 9(15). https://doi.org/10.1186/1741-7014-09-07  So  

## 2024-06-01 LAB — GC/CHLAMYDIA PROBE AMP (~~LOC~~) NOT AT ARMC
Chlamydia: NEGATIVE
Comment: NEGATIVE
Comment: NORMAL
Neisseria Gonorrhea: NEGATIVE

## 2024-08-07 ENCOUNTER — Other Ambulatory Visit: Payer: Self-pay | Admitting: Obstetrics and Gynecology

## 2024-08-12 ENCOUNTER — Inpatient Hospital Stay (HOSPITAL_COMMUNITY)
Admission: AD | Admit: 2024-08-12 | Discharge: 2024-08-12 | Disposition: A | Discharge status: Home / Self Care | Attending: Obstetrics and Gynecology | Admitting: Obstetrics and Gynecology

## 2024-08-12 ENCOUNTER — Encounter (HOSPITAL_COMMUNITY): Payer: Self-pay | Admitting: Obstetrics and Gynecology

## 2024-08-12 DIAGNOSIS — Z3A32 32 weeks gestation of pregnancy: Secondary | ICD-10-CM | POA: Diagnosis not present

## 2024-08-12 DIAGNOSIS — O9A213 Injury, poisoning and certain other consequences of external causes complicating pregnancy, third trimester: Secondary | ICD-10-CM | POA: Diagnosis present

## 2024-08-12 DIAGNOSIS — T63301D Toxic effect of unspecified spider venom, accidental (unintentional), subsequent encounter: Secondary | ICD-10-CM | POA: Diagnosis not present

## 2024-08-12 DIAGNOSIS — T63301A Toxic effect of unspecified spider venom, accidental (unintentional), initial encounter: Secondary | ICD-10-CM | POA: Insufficient documentation

## 2024-08-12 DIAGNOSIS — O26893 Other specified pregnancy related conditions, third trimester: Secondary | ICD-10-CM | POA: Diagnosis not present

## 2024-08-12 LAB — URINALYSIS, ROUTINE W REFLEX MICROSCOPIC
Bilirubin Urine: NEGATIVE
Glucose, UA: NEGATIVE mg/dL
Hgb urine dipstick: NEGATIVE
Ketones, ur: 20 mg/dL — AB
Leukocytes,Ua: NEGATIVE
Nitrite: NEGATIVE
Protein, ur: NEGATIVE mg/dL
Specific Gravity, Urine: 1.005 (ref 1.005–1.030)
pH: 6 (ref 5.0–8.0)

## 2024-08-12 NOTE — MAU Note (Addendum)
..  Michele Fields is a 28 y.o. at [redacted]w[redacted]d here in MAU reporting: spider bite on Saturday. Has been getting increasingly swollen and painful since then. Went to urgent care yesterday was prescribed antibiotics. Has taken 3 doses of antibiotics but feels like it is still getting worse. Was told by OB to come be seen in MAU.  Bite is on posterior upper right leg. Red, swollen, painful. Rates pain 9/10 and describes it as shooting.   Pt feels hot. Having diarrhea (x4 occurrences in last 24 hours)   Pain score: 9/10 Vitals:   08/12/24 1620  BP: 124/75  Pulse: (!) 113  Resp: 16  Temp: 99.1 F (37.3 C)  SpO2: 99%     FHT:150 Lab orders placed from triage:  UA

## 2024-08-12 NOTE — MAU Provider Note (Signed)
 History     CSN: [id number]  Arrival date and time: 08/12/24 1559   Event Date/Time   First Provider Initiated Contact with Patient 08/12/24 1635      Chief Complaint  Patient presents with   Insect Bite   HPI Michele Fields is a 28 y.o. year old G39P2022 female at [redacted]w[redacted]d weeks gestation who presents to MAU reporting getting bitten by a spider Saturday. The bite has become increasingly swollen and painful. She was seen in Urgent Care yesterday and was Rx'd Amoxicillin  and Mupirocin. She has taken 3 doses of the Amoxicillin  and feels like it's getting worse. The bite is located on the back side of her upper RT thigh. 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 She receives [facility] with [facility]; next appt is 08/26/2024.    OB History     Gravida  5   Para  2   Term  2   Preterm  0   AB  2   Living  2      SAB  2   IAB  0   Ectopic  0   Multiple  0   Live Births  2           Past Medical History:  Diagnosis Date   ADHD (attention deficit hyperactivity disorder)    Bacterial vaginosis    BRCA negative 10/2019   MyRisk neg   Family history of breast cancer    Increased risk of breast cancer 10/2019   IBIS=25.3%/riskscore=20.2%   Yeast infection     Past Surgical History:  Procedure Laterality Date   CESAREAN SECTION N/A 10/01/2020   Procedure: CESAREAN SECTION;  Surgeon: Storm Setter, DO;  Location: [facility];  Service: Obstetrics;  Laterality: N/A;   CESAREAN SECTION N/A 11/26/2021   Procedure: CESAREAN SECTION;  Surgeon: Henry Slough, MD;  Location: [facility];  Service: Obstetrics;  Laterality: N/A;   CHOLECYSTECTOMY     NO PAST SURGERIES      Family History  Problem Relation Age of Onset   Thyroid  disease Paternal Grandmother    Ovarian cancer Paternal Grandmother 26   Breast cancer Maternal Aunt 32   Breast cancer Other 62   Cervical cancer Paternal Aunt 37    Social  History   Tobacco Use   Smoking status: Former    Current packs/day: 0.00    Types: Cigarettes    Quit date: 03/24/2020    Years since quitting: 4.3   Smokeless tobacco: Never  Vaping Use   Vaping status: Former  Substance Use Topics   Alcohol use: Not Currently   Drug use: No    Allergies:  Allergies  Allergen Reactions   Other Other (See Comments)    Seasonal Allergies    Medications Prior to Admission  Medication Sig Dispense Refill Last Dose/Taking   amoxicillin  (AMOXIL ) 500 MG tablet Take 500 mg by mouth 2 (two) times daily.   08/12/2024 Noon   mupirocin cream (BACTROBAN) 2 % Apply 1 Application topically 2 (two) times daily.   08/12/2024 Morning   oxyCODONE -acetaminophen  (PERCOCET/ROXICET) 5-325 MG tablet Take 1-2 tablets by mouth every 6 (six) hours as needed for severe pain (pain score 7-10). 10 tablet 0 08/11/2024 Evening   cyclobenzaprine  (FLEXERIL ) 10 MG tablet Take 1 tablet (10 mg total) by mouth 3 (three) times daily as needed (back pain). (Patient taking differently: Take 10 mg by  mouth 3 (three) times daily as needed for muscle spasms.) 30 tablet 0    glyBURIDE (DIABETA) 2.5 MG tablet Take 2.5 mg by mouth daily with breakfast.      ibuprofen  (ADVIL ) 600 MG tablet Take 1 tablet (600 mg total) by mouth every 6 (six) hours as needed. 20 tablet 0    iron  polysaccharides (FERREX 150) 150 MG capsule Take 1 capsule (150 mg total) by mouth daily. (Patient not taking: Reported on 11/21/2021)      ondansetron  (ZOFRAN ) 8 MG tablet Take by mouth every 8 (eight) hours as needed for nausea or vomiting.      ondansetron  (ZOFRAN -ODT) 4 MG disintegrating tablet Take 1 tablet (4 mg total) by mouth every 8 (eight) hours as needed. 30 tablet 0    promethazine  (PHENERGAN ) 25 MG tablet Take 1 tablet (25 mg total) by mouth every 6 (six) hours as needed. 60 tablet 0    valACYclovir (VALTREX) 500 MG tablet Take 500 mg by mouth daily.   Unknown    Review of Systems  Constitutional: Negative.    HENT: Negative.    Eyes: Negative.   Respiratory: Negative.    Cardiovascular: Negative.   Gastrointestinal: Negative.   Endocrine: Negative.   Genitourinary: Negative.   Musculoskeletal: Negative.   Skin:  Positive for wound (painful, red and swollen).  Allergic/Immunologic: Negative.   Neurological: Negative.   Hematological: Negative.   Psychiatric/Behavioral: Negative.     Physical Exam   Blood pressure 124/75, pulse (!) 113, temperature 99.1 F (37.3 C), temperature source Oral, resp. rate 16, height 5' 3 (1.6 m), weight 88.2 kg, last menstrual period 12/22/2023, SpO2 99%, unknown if currently breastfeeding.  Physical Exam Vitals and nursing note reviewed.  Constitutional:      Appearance: Normal appearance. She is obese.  Skin:    Comments: Reddened, raised spider bite (see inserted photos)  Neurological:     Mental Status: She is alert.    Picture from patient's phone   Pictures taken in MAU today        REACTIVE NST - FHR: 150 bpm / moderate variability / accels present / decels absent / TOCO: UI noted  MAU Course  Procedures  MDM EFM  *Consult with Dr. Herchel @ [phone] - notified of patient's complaints, assessments, NST results, recommended tx plan continue current regimen, but f/u with [facility] -- advised that if not any better, recommend changing abx to Clindamycin - ok to d/c home, agrees with plan  TC to Dr. Armond called @ 1700 to make sure f/u appt gets setup. Assessment and Plan  1. Spider bite wound, accidental or unintentional, subsequent encounter (Primary) - Continue with current tx regimen - F/U with [facility]   2. [redacted] weeks gestation of pregnancy   - Discharge home - F/U with [facility] on Friday 08/14/2024 - Patient verbalized an understanding of the plan of care and agrees.    [name], CNM 08/12/2024, 4:35 PM

## 2024-08-17 ENCOUNTER — Encounter (HOSPITAL_COMMUNITY): Payer: Self-pay | Admitting: Obstetrics and Gynecology

## 2024-08-17 ENCOUNTER — Inpatient Hospital Stay (HOSPITAL_COMMUNITY)
Admission: AD | Admit: 2024-08-17 | Discharge: 2024-08-17 | Disposition: A | Discharge status: Home / Self Care | Attending: Obstetrics and Gynecology | Admitting: Obstetrics and Gynecology

## 2024-08-17 DIAGNOSIS — O4700 False labor before 37 completed weeks of gestation, unspecified trimester: Secondary | ICD-10-CM

## 2024-08-17 DIAGNOSIS — Z87891 Personal history of nicotine dependence: Secondary | ICD-10-CM | POA: Diagnosis not present

## 2024-08-17 DIAGNOSIS — Z3A33 33 weeks gestation of pregnancy: Secondary | ICD-10-CM | POA: Diagnosis not present

## 2024-08-17 DIAGNOSIS — O4703 False labor before 37 completed weeks of gestation, third trimester: Secondary | ICD-10-CM | POA: Diagnosis present

## 2024-08-17 DIAGNOSIS — O479 False labor, unspecified: Secondary | ICD-10-CM

## 2024-08-17 LAB — URINALYSIS, ROUTINE W REFLEX MICROSCOPIC
Bilirubin Urine: NEGATIVE
Glucose, UA: 50 mg/dL — AB
Hgb urine dipstick: NEGATIVE
Ketones, ur: 20 mg/dL — AB
Nitrite: NEGATIVE
Protein, ur: 30 mg/dL — AB
Specific Gravity, Urine: 1.027 (ref 1.005–1.030)
pH: 5 (ref 5.0–8.0)

## 2024-08-17 LAB — OB RESULTS CONSOLE GBS: GBS: NEGATIVE

## 2024-08-17 MED ORDER — ONDANSETRON 4 MG PO TBDP
8.0000 mg | ORAL_TABLET | Freq: Once | ORAL | Status: AC
  Administered 2024-08-17: 8 mg via ORAL
  Filled 2024-08-17: qty 2

## 2024-08-17 MED ORDER — ONDANSETRON 4 MG PO TBDP: 4.0000 mg | ORAL_TABLET | Freq: Four times a day (QID) | ORAL | 0 refills | Status: DC | PRN

## 2024-08-17 MED ORDER — NIFEDIPINE 10 MG PO CAPS
10.0000 mg | ORAL_CAPSULE | Freq: Once | ORAL | Status: AC
  Administered 2024-08-17: 10 mg via ORAL
  Filled 2024-08-17: qty 1

## 2024-08-17 MED ORDER — NIFEDIPINE ER OSMOTIC RELEASE 30 MG PO TB24: 30.0000 mg | ORAL_TABLET | Freq: Every day | ORAL | 1 refills | Status: DC

## 2024-08-17 NOTE — MAU Provider Note (Signed)
 History     [id number]  Arrival date and time: 08/17/24 1756    Chief Complaint  Patient presents with   Contractions     HPI Michele Fields is a 28 y.o. at [redacted]w[redacted]d by US  with PMHx notable for GDM, who presents for contractions.   Patient sent from office with contractions that have been worsening over the course of the day No bleeding or leaking fluid Fetal movement is normal Variable length between contractions, hard to pin down exact timing with her     OB History     Gravida  5   Para  2   Term  2   Preterm  0   AB  2   Living  2      SAB  2   IAB  0   Ectopic  0   Multiple  0   Live Births  2           Past Medical History:  Diagnosis Date   ADHD (attention deficit hyperactivity disorder)    Bacterial vaginosis    BRCA negative 10/2019   MyRisk neg   Family history of breast cancer    Increased risk of breast cancer 10/2019   IBIS=25.3%/riskscore=20.2%   Yeast infection     Past Surgical History:  Procedure Laterality Date   CESAREAN SECTION N/A 10/01/2020   Procedure: CESAREAN SECTION;  Surgeon: Storm Setter, DO;  Location: [facility];  Service: Obstetrics;  Laterality: N/A;   CESAREAN SECTION N/A 11/26/2021   Procedure: CESAREAN SECTION;  Surgeon: Henry Slough, MD;  Location: [facility]S;  Service: Obstetrics;  Laterality: N/A;   CHOLECYSTECTOMY     NO PAST SURGERIES      Family History  Problem Relation Age of Onset   Thyroid  disease Paternal Grandmother    Ovarian cancer Paternal Grandmother 71   Breast cancer Maternal Aunt 32   Breast cancer Other 62   Cervical cancer Paternal Aunt 35    Social History   Socioeconomic History   Marital status: Single    Spouse name: Not on file   Number of children: Not on file   Years of education: Not on file   Highest education level: Not on file  Occupational History   Not on file  Tobacco Use   Smoking status: Former    Current packs/day: 0.00    Types: Cigarettes    Quit  date: 03/24/2020    Years since quitting: 4.4   Smokeless tobacco: Never  Vaping Use   Vaping status: Former  Substance and Sexual Activity   Alcohol use: Not Currently   Drug use: No   Sexual activity: Not Currently    Birth control/protection: None  Other Topics Concern   Not on file  Social History Narrative   Not on file   Social Drivers of Health   Financial Resource Strain: Low Risk  (03/09/2023)   Received from [facility]   Overall Financial Resource Strain (CARDIA)    Difficulty of Paying Living Expenses: Not very hard  Food Insecurity: No Food Insecurity (08/09/2024)   Received from [facility]   Hunger Vital Sign    Within the past 12 months, you worried that your food would run out before you got the money to buy more.: Never true    Within the past 12 months, the food you bought just didn't last and you didn't have money to get more.: Never true  Transportation Needs: Unknown (08/09/2024)   Received  from [facility]   PRAPARE - Transportation    Lack of Transportation (Medical): Not on file    In the past 12 months, has lack of transportation kept you from meetings, work, or from getting things needed for daily living?: No  Physical Activity: Insufficiently Active (11/18/2017)   Exercise Vital Sign    Days of Exercise per Week: 2 days    Minutes of Exercise per Session: 60 min  Stress: Patient Declined (03/09/2023)   Received from [facility] - Occupational Stress Questionnaire    Feeling of Stress : Patient declined  Social Connections: Unknown (06/04/2022)   Received from [facility]   Social Network    Social Network: Not on file  Intimate Partner Violence: Not At Risk (08/09/2024)   Received from [facility]   Humiliation, Afraid, Rape, and Kick questionnaire    Within the last year, have you been afraid of your partner or ex-partner?: No    Within the last year, have you been humiliated or emotionally  abused in other ways by your partner or ex-partner?: No    Within the last year, have you been kicked, hit, slapped, or otherwise physically hurt by your partner or ex-partner?: No    Within the last year, have you been raped or forced to have any kind of sexual activity by your partner or ex-partner?: No    Allergies  Allergen Reactions   Other Other (See Comments)    Seasonal Allergies    No current facility-administered medications on file prior to encounter.   Current Outpatient Medications on File Prior to Encounter  Medication Sig Dispense Refill   amoxicillin  (AMOXIL ) 500 MG tablet Take 500 mg by mouth 2 (two) times daily.     oxyCODONE -acetaminophen  (PERCOCET/ROXICET) 5-325 MG tablet Take 1-2 tablets by mouth every 6 (six) hours as needed for severe pain (pain score 7-10). 10 tablet 0   cyclobenzaprine  (FLEXERIL ) 10 MG tablet Take 1 tablet (10 mg total) by mouth 3 (three) times daily as needed (back pain). (Patient taking differently: Take 10 mg by mouth 3 (three) times daily as needed for muscle spasms.) 30 tablet 0   glyBURIDE (DIABETA) 2.5 MG tablet Take 2.5 mg by mouth daily with breakfast.     iron  polysaccharides (FERREX 150) 150 MG capsule Take 1 capsule (150 mg total) by mouth daily. (Patient not taking: Reported on 11/21/2021)     mupirocin cream (BACTROBAN) 2 % Apply 1 Application topically 2 (two) times daily.     ondansetron  (ZOFRAN ) 8 MG tablet Take by mouth every 8 (eight) hours as needed for nausea or vomiting.     valACYclovir (VALTREX) 500 MG tablet Take 500 mg by mouth daily.       ROS Pertinent positives and negative per HPI, all others reviewed and negative  Physical Exam   BP 135/75 (BP Location: Right Arm)   Pulse (!) 111   Temp 98.8 F (37.1 C) (Oral)   Resp 18   Ht 5' 3 (1.6 m)   Wt 88.4 kg   LMP 12/22/2023   SpO2 100%   BMI 34.51 kg/m   Patient Vitals for the past 24 hrs:  BP Temp Temp src Pulse Resp SpO2 Height Weight  08/17/24 1818  135/75 98.8 F (37.1 C) Oral (!) 111 18 100 % 5' 3 (1.6 m) 88.4 kg    Physical Exam Vitals reviewed.  Constitutional:      General: She is not in acute  distress.    Appearance: She is well-developed. She is not diaphoretic.  Eyes:     General: No scleral icterus. Pulmonary:     Effort: Pulmonary effort is normal. No respiratory distress.  Abdominal:     General: There is no distension.     Palpations: Abdomen is soft.     Tenderness: There is no abdominal tenderness. There is no guarding or rebound.  Skin:    General: Skin is warm and dry.  Neurological:     Mental Status: She is alert.     Coordination: Coordination normal.      Cervical Exam   Closed  Bedside Ultrasound Not performed.  My interpretation: n/a  FHT Baseline: 145 bpm Variability: Good {> 6 bpm) Accelerations: Reactive Decelerations: Absent Uterine activity: rare Cat: I  Labs Results for orders placed or performed during the hospital encounter of 08/17/24 (from the past 24 hours)  Urinalysis, Routine w reflex microscopic -Urine, Clean Catch     Status: Abnormal   Collection Time: 08/17/24  6:27 PM  Result Value Ref Range   Color, Urine YELLOW YELLOW   APPearance HAZY (A) CLEAR   Specific Gravity, Urine 1.027 1.005 - 1.030   pH 5.0 5.0 - 8.0   Glucose, UA 50 (A) NEGATIVE mg/dL   Hgb urine dipstick NEGATIVE NEGATIVE   Bilirubin Urine NEGATIVE NEGATIVE   Ketones, ur 20 (A) NEGATIVE mg/dL   Protein, ur 30 (A) NEGATIVE mg/dL   Nitrite NEGATIVE NEGATIVE   Leukocytes,Ua TRACE (A) NEGATIVE   RBC / HPF 0-5 0 - 5 RBC/hpf   WBC, UA 0-5 0 - 5 WBC/hpf   Bacteria, UA RARE (A) NONE SEEN   Squamous Epithelial / HPF 6-10 0 - 5 /HPF   Mucus PRESENT     Imaging No results found.  MAU Course  Procedures Lab Orders         Urinalysis, Routine w reflex microscopic -Urine, Clean Catch    Meds ordered this encounter  Medications   NIFEdipine  (PROCARDIA ) capsule 10 mg   ondansetron  (ZOFRAN -ODT)  disintegrating tablet 8 mg   ondansetron  (ZOFRAN -ODT) 4 MG disintegrating tablet    Sig: Take 1 tablet (4 mg total) by mouth every 6 (six) hours as needed for nausea.    Dispense:  20 tablet    Refill:  0   NIFEdipine  (PROCARDIA -XL/NIFEDICAL-XL) 30 MG 24 hr tablet    Sig: Take 1 tablet (30 mg total) by mouth daily.    Dispense:  30 tablet    Refill:  1   Imaging Orders  No imaging studies ordered today    MDM Moderate (Level 3-4)  Assessment and Plan  #Preterm contractions without labor #[redacted] weeks gestation of pregnancy Cervix closed in office and again on exam here several hours later. NST reactive and reassuring. Discussed we could try nifedipine  to see if she gets some relief. Given one dose with good effect, rx sent for XL version. Routine return precautions.   #FWB FHT Cat I NST: Reactive   Dispo: discharged to home in stable condition    Donnice CHRISTELLA Carolus, MD/MPH 08/17/24 8:55 PM  Allergies as of 08/17/2024       Reactions   Other Other (See Comments)   Seasonal Allergies        Medication List     STOP taking these medications    oxyCODONE -acetaminophen  5-325 MG tablet Commonly known as: PERCOCET/ROXICET       TAKE these medications    amoxicillin  500 MG tablet  Commonly known as: AMOXIL  Take 500 mg by mouth 2 (two) times daily.   cyclobenzaprine  10 MG tablet Commonly known as: FLEXERIL  Take 1 tablet (10 mg total) by mouth 3 (three) times daily as needed (back pain). What changed: reasons to take this   glyBURIDE 2.5 MG tablet Commonly known as: DIABETA Take 2.5 mg by mouth daily with breakfast.   iron  polysaccharides 150 MG capsule Commonly known as: Ferrex 150 Take 1 capsule (150 mg total) by mouth daily.   mupirocin cream 2 % Commonly known as: BACTROBAN Apply 1 Application topically 2 (two) times daily.   NIFEdipine  30 MG 24 hr tablet Commonly known as: PROCARDIA -XL/NIFEDICAL-XL Take 1 tablet (30 mg total) by mouth daily.    ondansetron  4 MG disintegrating tablet Commonly known as: ZOFRAN -ODT Take 1 tablet (4 mg total) by mouth every 6 (six) hours as needed for nausea.   ondansetron  8 MG tablet Commonly known as: ZOFRAN  Take by mouth every 8 (eight) hours as needed for nausea or vomiting.   valACYclovir 500 MG tablet Commonly known as: VALTREX Take 500 mg by mouth daily.

## 2024-08-17 NOTE — MAU Note (Signed)
 Michele Fields is a 28 y.o. at [redacted]w[redacted]d here in MAU reporting: has been contracting since 1100 have gotten stronger and closer.  Feeling pressure. Sent from office. Had one while she was there.  Dr sent her for monitoring. She was closed.  Had PTL with first preg, it was stopped. No bleeding or LOF.  Reports +FM.  Onset of complaint: 1100 Pain score: 9 Vitals:   08/17/24 1818  BP: 135/75  Pulse: (!) 111  Resp: 18  Temp: 98.8 F (37.1 C)  SpO2: 100%     FHT:148 Lab orders placed from triage:  UA

## 2024-08-19 ENCOUNTER — Other Ambulatory Visit: Payer: Self-pay | Admitting: Obstetrics and Gynecology

## 2024-09-08 ENCOUNTER — Encounter (HOSPITAL_COMMUNITY): Payer: Self-pay

## 2024-09-08 ENCOUNTER — Telehealth (HOSPITAL_COMMUNITY): Payer: Self-pay | Admitting: *Deleted

## 2024-09-08 NOTE — Patient Instructions (Addendum)
 Michele Fields  09/08/2024   Your procedure is scheduled on:  09/22/2024  Arrive at 0800 at Entrance C on [address] at [facility]  and [employer]. You are invited to use the FREE valet parking or use the Visitor's parking deck.  Pick up the phone at the desk and dial [phone].  Call this number if you have problems the morning of surgery: [phone]  Remember:   Do not eat food:(After Midnight) Desps de medianoche.  You may drink clear liquids until  __0600___.  Clear liquids means a liquid you can see thru.  It can have color such as Cola or Kool aid.  Tea is OK and coffee as long as no milk or creamer of any kind.  Take these medicines the morning of surgery with A SIP OF WATER :  valtrex as prescribed   Do not wear jewelry, make-up or nail polish.  Do not wear lotions, powders, or perfumes. Do not wear deodorant.  Do not shave 48 hours prior to surgery.  Do not bring valuables to the hospital.  [facility] is not   responsible for any belongings or valuables brought to the hospital.  Contacts, dentures or bridgework may not be worn into surgery.  Leave suitcase in the car. After surgery it may be brought to your room.  For patients admitted to the hospital, checkout time is 11:00 AM the day of              discharge.      Please read over the following fact sheets that you were given:     Preparing for Surgery

## 2024-09-08 NOTE — Telephone Encounter (Signed)
 Preadmission screen

## 2024-09-09 ENCOUNTER — Other Ambulatory Visit: Payer: Self-pay | Admitting: Family Medicine

## 2024-09-16 ENCOUNTER — Other Ambulatory Visit: Payer: Self-pay

## 2024-09-16 ENCOUNTER — Inpatient Hospital Stay (HOSPITAL_COMMUNITY)
Admission: AD | Admit: 2024-09-16 | Discharge: 2024-09-16 | Disposition: A | Discharge status: Home / Self Care | Attending: Obstetrics and Gynecology | Admitting: Obstetrics and Gynecology

## 2024-09-16 DIAGNOSIS — Z3A37 37 weeks gestation of pregnancy: Secondary | ICD-10-CM | POA: Diagnosis not present

## 2024-09-16 DIAGNOSIS — O09293 Supervision of pregnancy with other poor reproductive or obstetric history, third trimester: Secondary | ICD-10-CM | POA: Diagnosis not present

## 2024-09-16 DIAGNOSIS — Z3689 Encounter for other specified antenatal screening: Secondary | ICD-10-CM

## 2024-09-16 DIAGNOSIS — O26893 Other specified pregnancy related conditions, third trimester: Secondary | ICD-10-CM | POA: Diagnosis present

## 2024-09-16 DIAGNOSIS — R102 Pelvic and perineal pain: Secondary | ICD-10-CM | POA: Insufficient documentation

## 2024-09-16 DIAGNOSIS — S334XXA Traumatic rupture of symphysis pubis, initial encounter: Secondary | ICD-10-CM

## 2024-09-16 LAB — URINALYSIS, ROUTINE W REFLEX MICROSCOPIC
Bacteria, UA: NONE SEEN
Bilirubin Urine: NEGATIVE
Glucose, UA: NEGATIVE mg/dL
Hgb urine dipstick: NEGATIVE
Ketones, ur: 20 mg/dL — AB
Leukocytes,Ua: NEGATIVE
Nitrite: NEGATIVE
Protein, ur: 30 mg/dL — AB
Specific Gravity, Urine: 1.025 (ref 1.005–1.030)
pH: 5 (ref 5.0–8.0)

## 2024-09-16 MED ORDER — ONDANSETRON 4 MG PO TBDP: 4.0000 mg | ORAL_TABLET | Freq: Four times a day (QID) | ORAL | 0 refills | Status: DC | PRN

## 2024-09-16 MED ORDER — SODIUM CHLORIDE 0.9 % IV SOLN
25.0000 mg | Freq: Once | INTRAVENOUS | Status: AC
  Administered 2024-09-16: 25 mg via INTRAVENOUS
  Filled 2024-09-16: qty 1

## 2024-09-16 MED ORDER — ACETAMINOPHEN 500 MG PO TABS
1000.0000 mg | ORAL_TABLET | Freq: Once | ORAL | Status: AC
  Administered 2024-09-16: 1000 mg via ORAL
  Filled 2024-09-16: qty 2

## 2024-09-16 MED ORDER — LACTATED RINGERS IV BOLUS
1000.0000 mL | Freq: Once | INTRAVENOUS | Status: AC
  Administered 2024-09-16: 1000 mL via INTRAVENOUS

## 2024-09-16 MED ORDER — HYDROMORPHONE HCL 1 MG/ML IJ SOLN
1.0000 mg | Freq: Once | INTRAMUSCULAR | Status: AC
  Administered 2024-09-16: 1 mg via INTRAVENOUS
  Filled 2024-09-16: qty 1

## 2024-09-16 MED ORDER — CYCLOBENZAPRINE HCL 5 MG PO TABS
10.0000 mg | ORAL_TABLET | Freq: Once | ORAL | Status: AC
  Administered 2024-09-16: 10 mg via ORAL
  Filled 2024-09-16: qty 2

## 2024-09-16 NOTE — MAU Provider Note (Cosign Needed)
 History     CSN: [id number]  Arrival date and time: 09/16/24 1621   Event Date/Time   First Provider Initiated Contact with Patient 09/16/24 1714      HPI Michele Fields is a 28 y.o. [id number] at [redacted]w[redacted]d who presents to MAU for complaints of pelvic pain that has been present for 2-3 months with worsening over the last 3 days. Pain is worse with movement. Reports similar complaints during her first pregnancy. She denies injury but states she has two active small children and has also been cleaning her home and walking up and down stairs a lot over the past few days. She states that she had one episode of nausea last night when her pain was at its worst. She last took oxycodone  at 0100 this date and has taken nothing since that time. Currently, she reports that her pain is 10/10.   Patient reports a history of symphysis pubis separation during her first pregnancy in 2021. MRI Pelvis from 06/29/2020 with the following impression: 1. No acute MR findings of the abdomen or pelvis to explain right lower quadrant abdominal pain in the setting of pregnancy. 2.  Normal appendix. 3. Mild right hydronephrosis, likely physiologic in the setting of pregnancy. 4. Gravid uterus with advanced gestation. No obvious abnormality of the fetus on this non tailored examination. No additional radiology results noted in medical record.   Patients reports +FM. Denies dysuria, LOF, vaginal discharge, or vaginal bleeding.   OB History     Gravida  5   Para  2   Term  2   Preterm  0   AB  2   Living  2      SAB  2   IAB  0   Ectopic  0   Multiple  0   Live Births  2           Past Medical History:  Diagnosis Date   ADHD (attention deficit hyperactivity disorder)    Bacterial vaginosis    BRCA negative 10/2019   MyRisk neg   Family history of breast cancer    Increased risk of breast cancer 10/2019   IBIS=25.3%/riskscore=20.2%   Yeast infection     Past Surgical History:  Procedure Laterality  Date   CESAREAN SECTION N/A 10/01/2020   Procedure: CESAREAN SECTION;  Surgeon: Storm Setter, DO;  Location: [facility] LD [facility]S;  Service: Obstetrics;  Laterality: N/A;   CESAREAN SECTION N/A 11/26/2021   Procedure: CESAREAN SECTION;  Surgeon: Henry Slough, MD;  Location: [facility] LD ORS;  Service: Obstetrics;  Laterality: N/A;   CHOLECYSTECTOMY     NO PAST SURGERIES      Family History  Problem Relation Age of Onset   Thyroid  disease Paternal Grandmother    Ovarian cancer Paternal Grandmother 60   Breast cancer Maternal Aunt 32   Breast cancer Other 62   Cervical cancer Paternal Aunt 38    Social History   Tobacco Use   Smoking status: Former    Current packs/day: 0.00    Types: Cigarettes    Quit date: 03/24/2020    Years since quitting: 4.4   Smokeless tobacco: Never  Vaping Use   Vaping status: Former  Substance Use Topics   Alcohol use: Not Currently   Drug use: No    Allergies:  Allergies  Allergen Reactions   Other Other (See Comments)    Seasonal Allergies    Medications Prior to Admission  Medication Sig Dispense Refill Last Dose/Taking  amoxicillin  (AMOXIL ) 500 MG tablet Take 500 mg by mouth 2 (two) times daily.      cyclobenzaprine  (FLEXERIL ) 10 MG tablet Take 1 tablet (10 mg total) by mouth 3 (three) times daily as needed (back pain). (Patient taking differently: Take 10 mg by mouth 3 (three) times daily as needed for muscle spasms.) 30 tablet 0    glyBURIDE (DIABETA) 2.5 MG tablet Take 2.5 mg by mouth daily with breakfast.      iron  polysaccharides (FERREX 150) 150 MG capsule Take 1 capsule (150 mg total) by mouth daily. (Patient not taking: Reported on 11/21/2021)      mupirocin cream (BACTROBAN) 2 % Apply 1 Application topically 2 (two) times daily.      NIFEdipine  (PROCARDIA -XL/NIFEDICAL-XL) 30 MG 24 hr tablet Take 1 tablet (30 mg total) by mouth daily. 30 tablet 1    ondansetron  (ZOFRAN ) 8 MG tablet Take by mouth every 8 (eight) hours as needed for nausea or  vomiting.      ondansetron  (ZOFRAN -ODT) 4 MG disintegrating tablet Take 1 tablet (4 mg total) by mouth every 6 (six) hours as needed for nausea. 20 tablet 0    valACYclovir (VALTREX) 500 MG tablet Take 500 mg by mouth daily.       Review of Systems  Musculoskeletal:        Musculoskeletal pelvic discomfort   Physical Exam   Blood pressure 127/84, pulse (!) 112, temperature 98.5 F (36.9 C), temperature source Oral, resp. rate 17, last menstrual period 12/22/2023, SpO2 100%, unknown if currently breastfeeding.  Physical Exam Vitals reviewed.  Constitutional:      General: She is not in acute distress.    Appearance: Normal appearance.  HENT:     Head: Normocephalic.  Cardiovascular:     Rate and Rhythm: Regular rhythm. Tachycardia present.     Pulses: Normal pulses.     Heart sounds: Normal heart sounds.  Pulmonary:     Effort: Pulmonary effort is normal.     Breath sounds: Normal breath sounds.  Abdominal:     Comments: Gravid; appropriate for gestational age  Skin:    General: Skin is warm and dry.  Neurological:     Mental Status: She is alert and oriented to person, place, and time.  Psychiatric:        Mood and Affect: Mood normal.        Behavior: Behavior normal.    MAU Course  Procedures  MDM - Urinalysis  Assessment and Plan  Michele Fields is a 28 y.o. [id number] at [redacted]w[redacted]d who presents to MAU for complaints of pelvic pain that has been present for 2-3 months with worsening over the last 3 days.  - Reassessment at 1800 - patient reports pain remains a 10/10. No obvious signs of distress. RN preparing to administer IV promethazine . Will reassess after administration.   - Reassessment at 1920 - patient reports pain at 8/10. No obvious signs of distress.   Plan - Urinalysis - + ketones and protein; otherwise normal - 1 L IV LR bolus - Heat to pelvis  - Cyclobenzaprine  10 mg by mouth - Acetaminophen  1,000 mg by mouth - Promethazine  25 mg IV - Hydromorphone  1 mg  IV  - Patient requests pelvis x-rays. Discussed potential risks of radiation exposure to the fetus. Informed patient that in the absence of recent trauma/injury, diagnostic value would not warrant the risk. Patient verbalized understanding.   - Discharge patient home in stable condition. Reports continued pain. No apparent distress. Home  to rest. Take pain medications as prescribed. Follow-up with OB as scheduled.   Debby CHRISTELLA Borer 09/16/2024, 5:20 PM

## 2024-09-16 NOTE — Discharge Instructions (Addendum)
 [email or web address]

## 2024-09-16 NOTE — MAU Note (Signed)
 MAU Triage Note  Michele Fields is a 28 y.o. at [redacted]w[redacted]d here in MAU reporting: sent from the office-- reports pelvic pain; has a dislocation on her pelvic symphysis bone. Reports it hurts to walk, endorses +FM, denies VB and LOF.    Onset of complaint: 3 days ago Pain score: 10/10 pelvis Vitals:   09/16/24 1646  BP: 123/80  Pulse: (!) 118  Resp: 17  Temp: 98.5 F (36.9 C)  SpO2: 100%     FHT: 149  Lab orders placed from triage: UA

## 2024-09-16 NOTE — MAU Provider Note (Incomplete Revision)
 History     CSN: [id number]  Arrival date and time: 09/16/24 1621   Event Date/Time   First Provider Initiated Contact with Patient 09/16/24 1714      Michele Fields is a 28 y.o. [id number] at [redacted]w[redacted]d who presents to MAU for complaints of pelvic pain that has been present for 2-3 months with worsening over the last 3 days. Pain is worse with movement. Reports similar complaints during her first pregnancy. She denies injury but states she has two active small children and has also been cleaning her home and walking up and down stairs a lot over the past few days. She states that she had one episode of nausea last night when her pain was at its worst. She last took oxycodone  at 0100 this date and has taken nothing since that time. Currently, she reports that her pain is 10/10.   Patient reports a history of symphysis pubis separation during her first pregnancy in 2021. MRI Pelvis from 06/29/2020 with the following impression: 1. No acute MR findings of the abdomen or pelvis to explain right lower quadrant abdominal pain in the setting of pregnancy. 2.  Normal appendix. 3. Mild right hydronephrosis, likely physiologic in the setting of pregnancy. 4. Gravid uterus with advanced gestation. No obvious abnormality of the fetus on this non tailored examination. No additional radiology results noted in medical record.   Patients reports +FM. Denies dysuria, LOF, vaginal discharge, or vaginal bleeding.   OB History     Gravida  5   Para  2   Term  2   Preterm  0   AB  2   Living  2      SAB  2   IAB  0   Ectopic  0   Multiple  0   Live Births  2           Past Medical History:  Diagnosis Date   ADHD (attention deficit hyperactivity disorder)    Bacterial vaginosis    BRCA negative 10/2019   MyRisk neg   Family history of breast cancer    Increased risk of breast cancer 10/2019   IBIS=25.3%/riskscore=20.2%   Yeast infection     Past Surgical History:  Procedure Laterality Date    CESAREAN SECTION N/A 10/01/2020   Procedure: CESAREAN SECTION;  Surgeon: Storm Setter, DO;  [facility]: [facility];  Service: Obstetrics;  Laterality: N/A;   CESAREAN SECTION N/A 11/26/2021   Procedure: CESAREAN SECTION;  Surgeon: Henry Slough, MD;  Location: [facility];  Service: Obstetrics;  Laterality: N/A;   CHOLECYSTECTOMY     NO PAST SURGERIES      Family History  Problem Relation Age of Onset   Thyroid  disease Paternal Grandmother    Ovarian cancer Paternal Grandmother 106   Breast cancer Maternal Aunt 32   Breast cancer Other 62   Cervical cancer Paternal Aunt 61    Social History   Tobacco Use   Smoking status: Former    Current packs/day: 0.00    Types: Cigarettes    Quit date: 03/24/2020    Years since quitting: 4.4   Smokeless tobacco: Never  Vaping Use   Vaping status: Former  Substance Use Topics   Alcohol use: Not Currently   Drug use: No    Allergies:  Allergies  Allergen Reactions   Other Other (See Comments)    Seasonal Allergies    Medications Prior to Admission  Medication Sig Dispense Refill Last Dose/Taking   amoxicillin  (  AMOXIL ) 500 MG tablet Take 500 mg by mouth 2 (two) times daily.      cyclobenzaprine  (FLEXERIL ) 10 MG tablet Take 1 tablet (10 mg total) by mouth 3 (three) times daily as needed (back pain). (Patient taking differently: Take 10 mg by mouth 3 (three) times daily as needed for muscle spasms.) 30 tablet 0    glyBURIDE (DIABETA) 2.5 MG tablet Take 2.5 mg by mouth daily with breakfast.      iron  polysaccharides (FERREX 150) 150 MG capsule Take 1 capsule (150 mg total) by mouth daily. (Patient not taking: Reported on 11/21/2021)      mupirocin cream (BACTROBAN) 2 % Apply 1 Application topically 2 (two) times daily.      NIFEdipine  (PROCARDIA -XL/NIFEDICAL-XL) 30 MG 24 hr tablet Take 1 tablet (30 mg total) by mouth daily. 30 tablet 1    ondansetron  (ZOFRAN ) 8 MG tablet Take by mouth every 8 (eight) hours as needed for nausea or vomiting.       ondansetron  (ZOFRAN -ODT) 4 MG disintegrating tablet Take 1 tablet (4 mg total) by mouth every 6 (six) hours as needed for nausea. 20 tablet 0    valACYclovir (VALTREX) 500 MG tablet Take 500 mg by mouth daily.       Review of Systems  Musculoskeletal:        Musculoskeletal pelvic discomfort   Physical Exam   Blood pressure 127/84, pulse (!) 112, temperature 98.5 F (36.9 C), temperature source Oral, resp. rate 17, last menstrual period 12/22/2023, SpO2 100%, unknown if currently breastfeeding.  Physical Exam Vitals reviewed.  Constitutional:      General: She is not in acute distress.    Appearance: Normal appearance.  HENT:     Head: Normocephalic.  Cardiovascular:     Rate and Rhythm: Regular rhythm. Tachycardia present.     Pulses: Normal pulses.     Heart sounds: Normal heart sounds.  Pulmonary:     Effort: Pulmonary effort is normal.     Breath sounds: Normal breath sounds.  Abdominal:     Comments: Gravid; appropriate for gestational age  Skin:    General: Skin is warm and dry.  Neurological:     Mental Status: She is alert and oriented to person, place, and time.  Psychiatric:        Mood and Affect: Mood normal.        Behavior: Behavior normal.    MAU Course  Procedures  MDM - Urinalysis  Assessment and Plan  Michele Fields is a 28 y.o. [id number] at [redacted]w[redacted]d who presents to MAU for complaints of pelvic pain that has been present for 2-3 months with worsening over the last 3 days.  - Reassessment at 1800 - patient reports pain remains a 10/10. No obvious signs of distress. RN preparing to administer IV promethazine . Will reassess after administration.   - Reassessment at 1920 - patient reports pain at 8/10. No obvious signs of distress.   Plan - Urinalysis - + ketones and protein; otherwise normal - 1 L IV LR bolus - Heat to pelvis  - Cyclobenzaprine  10 mg by mouth - Acetaminophen  1,000 mg by mouth - Promethazine  25 mg IV - Hydromorphone  1 mg IV  -  Patient requests pelvis x-rays. Discussed potential risks of radiation exposure to the fetus. Informed patient that in the absence of recent trauma/injury, diagnostic value would not warrant the risk. Patient verbalized understanding.   - Discharge patient home in stable condition. Reports continued pain. No apparent distress. Home to  rest. Take pain medications as prescribed. Follow-up with OB as scheduled.   Debby CHRISTELLA Borer 09/16/2024, 5:20 PM    Attestation of Supervision of Student:  I confirm that I have verified the information documented in the nurse practitioner student's note and that I have also personally reperformed the history, physical exam and all medical decision making activities.  I have verified that all services and findings are accurately documented in this student's note; and I agree with management and plan as outlined in the documentation. I have also made any necessary editorial changes.  Extensive discussion   Harlene LITTIE Duncans, CNM [facility] [employer], [facility] 09/16/2024 8:40 PM

## 2024-09-18 NOTE — Anesthesia Preprocedure Evaluation (Addendum)
 Anesthesia Evaluation  Patient identified by MRN, date of birth, ID band Patient awake    Reviewed: Allergy & Precautions, NPO status , Patient's Chart, lab work & pertinent test results  History of Anesthesia Complications Negative for: history of anesthetic complications  Airway Mallampati: II  TM Distance: >3 FB Neck ROM: Full    Dental no notable dental hx.    Pulmonary former smoker   Pulmonary exam normal        Cardiovascular negative cardio ROS Normal cardiovascular exam     Neuro/Psych  Headaches    GI/Hepatic negative GI ROS, Neg liver ROS,,,  Endo/Other  negative endocrine ROS    Renal/GU negative Renal ROS     Musculoskeletal negative musculoskeletal ROS (+)    Abdominal   Peds  Hematology negative hematology ROS (+)   Anesthesia Other Findings   Reproductive/Obstetrics (+) Pregnancy (Hx of C/S x2)                              Anesthesia Physical Anesthesia Plan  ASA: 2  Anesthesia Plan: Spinal   Post-op Pain Management: Ofirmev  IV (intra-op)*   Induction:   PONV Risk Score and Plan: 4 or greater and Treatment may vary due to age or medical condition, Ondansetron  and Dexamethasone   Airway Management Planned: Natural Airway  Additional Equipment: None  Intra-op Plan:   Post-operative Plan:   Informed Consent: I have reviewed the patients History and Physical, chart, labs and discussed the procedure including the risks, benefits and alternatives for the proposed anesthesia with the patient or authorized representative who has indicated his/her understanding and acceptance.       Plan Discussed with: CRNA  Anesthesia Plan Comments:          Anesthesia Quick Evaluation

## 2024-09-21 ENCOUNTER — Encounter (HOSPITAL_COMMUNITY)
Admission: RE | Admit: 2024-09-21 | Discharge: 2024-09-21 | Disposition: A | Discharge status: Home / Self Care | Source: Ambulatory Visit | Attending: Obstetrics and Gynecology | Admitting: Obstetrics and Gynecology

## 2024-09-21 DIAGNOSIS — Z01812 Encounter for preprocedural laboratory examination: Secondary | ICD-10-CM | POA: Diagnosis present

## 2024-09-21 LAB — RPR: RPR Ser Ql: NONREACTIVE

## 2024-09-21 LAB — CBC
HCT: 34.4 % — ABNORMAL LOW (ref 36.0–46.0)
Hemoglobin: 11 g/dL — ABNORMAL LOW (ref 12.0–15.0)
MCH: 29.2 pg (ref 26.0–34.0)
MCHC: 32 g/dL (ref 30.0–36.0)
MCV: 91.2 fL (ref 80.0–100.0)
Platelets: 179 K/uL (ref 150–400)
RBC: 3.77 MIL/uL — ABNORMAL LOW (ref 3.87–5.11)
RDW: 14.6 % (ref 11.5–15.5)
WBC: 12.5 K/uL — ABNORMAL HIGH (ref 4.0–10.5)
nRBC: 0 % (ref 0.0–0.2)

## 2024-09-21 LAB — TYPE AND SCREEN
ABO/RH(D): A POS
Antibody Screen: NEGATIVE

## 2024-09-22 ENCOUNTER — Inpatient Hospital Stay (HOSPITAL_COMMUNITY): Payer: Self-pay | Admitting: Anesthesiology

## 2024-09-22 ENCOUNTER — Other Ambulatory Visit: Payer: Self-pay

## 2024-09-22 ENCOUNTER — Encounter (HOSPITAL_COMMUNITY)
Admission: RE | Disposition: A | Discharge status: Home / Self Care | Payer: Self-pay | Source: Home / Self Care | Attending: Obstetrics and Gynecology

## 2024-09-22 ENCOUNTER — Inpatient Hospital Stay (HOSPITAL_COMMUNITY)
Admission: RE | Admit: 2024-09-22 | Discharge: 2024-09-24 | DRG: 787 | Disposition: A | Discharge status: Home / Self Care | Attending: Obstetrics and Gynecology | Admitting: Obstetrics and Gynecology

## 2024-09-22 ENCOUNTER — Encounter (HOSPITAL_COMMUNITY): Payer: Self-pay | Admitting: Obstetrics and Gynecology

## 2024-09-22 DIAGNOSIS — Z87891 Personal history of nicotine dependence: Secondary | ICD-10-CM | POA: Diagnosis not present

## 2024-09-22 DIAGNOSIS — Z3A39 39 weeks gestation of pregnancy: Secondary | ICD-10-CM

## 2024-09-22 DIAGNOSIS — A6000 Herpesviral infection of urogenital system, unspecified: Secondary | ICD-10-CM | POA: Diagnosis present

## 2024-09-22 DIAGNOSIS — O9081 Anemia of the puerperium: Secondary | ICD-10-CM | POA: Diagnosis not present

## 2024-09-22 DIAGNOSIS — O9832 Other infections with a predominantly sexual mode of transmission complicating childbirth: Secondary | ICD-10-CM | POA: Diagnosis present

## 2024-09-22 DIAGNOSIS — Z3A00 Weeks of gestation of pregnancy not specified: Secondary | ICD-10-CM

## 2024-09-22 DIAGNOSIS — Z3490 Encounter for supervision of normal pregnancy, unspecified, unspecified trimester: Secondary | ICD-10-CM

## 2024-09-22 DIAGNOSIS — O34211 Maternal care for low transverse scar from previous cesarean delivery: Secondary | ICD-10-CM | POA: Diagnosis present

## 2024-09-22 DIAGNOSIS — D62 Acute posthemorrhagic anemia: Secondary | ICD-10-CM | POA: Diagnosis not present

## 2024-09-22 DIAGNOSIS — Z98891 History of uterine scar from previous surgery: Principal | ICD-10-CM

## 2024-09-22 LAB — CBC
HCT: 31.9 % — ABNORMAL LOW (ref 36.0–46.0)
Hemoglobin: 10.6 g/dL — ABNORMAL LOW (ref 12.0–15.0)
MCH: 29.1 pg (ref 26.0–34.0)
MCHC: 33.2 g/dL (ref 30.0–36.0)
MCV: 87.6 fL (ref 80.0–100.0)
Platelets: 217 K/uL (ref 150–400)
RBC: 3.64 MIL/uL — ABNORMAL LOW (ref 3.87–5.11)
RDW: 14.3 % (ref 11.5–15.5)
WBC: 23.8 K/uL — ABNORMAL HIGH (ref 4.0–10.5)
nRBC: 0 % (ref 0.0–0.2)

## 2024-09-22 LAB — CREATININE, SERUM
Creatinine, Ser: 0.47 mg/dL (ref 0.44–1.00)
GFR, Estimated: >60 mL/min (ref >60)

## 2024-09-22 SURGERY — Surgical Case
Anesthesia: Spinal | Site: Abdomen

## 2024-09-22 MED ORDER — SIMETHICONE 80 MG PO CHEW: 80.0000 mg | CHEWABLE_TABLET | ORAL | Status: DC | PRN

## 2024-09-22 MED ORDER — FENTANYL CITRATE (PF) 100 MCG/2ML IJ SOLN
INTRAMUSCULAR | Status: DC | PRN
  Administered 2024-09-22: 15 ug via INTRATHECAL

## 2024-09-22 MED ORDER — DEXAMETHASONE SODIUM PHOSPHATE 10 MG/ML IJ SOLN
INTRAMUSCULAR | Status: AC
  Filled 2024-09-22: qty 1

## 2024-09-22 MED ORDER — STERILE WATER FOR IRRIGATION IR SOLN
Status: DC | PRN
  Administered 2024-09-22: 1000 mL

## 2024-09-22 MED ORDER — SOD CITRATE-CITRIC ACID 500-334 MG/5ML PO SOLN
30.0000 mL | ORAL | Status: AC
  Administered 2024-09-22: 30 mL via ORAL

## 2024-09-22 MED ORDER — SIMETHICONE 80 MG PO CHEW
80.0000 mg | CHEWABLE_TABLET | Freq: Three times a day (TID) | ORAL | Status: DC
Start: 2024-09-22 — End: 2024-09-24
  Administered 2024-09-22 – 2024-09-24 (×6): 80 mg via ORAL
  Filled 2024-09-22 (×6): qty 1

## 2024-09-22 MED ORDER — DROPERIDOL 2.5 MG/ML IJ SOLN: 0.6250 mg | Freq: Once | INTRAMUSCULAR | Status: DC | PRN

## 2024-09-22 MED ORDER — MORPHINE SULFATE (PF) 0.5 MG/ML IJ SOLN
INTRAMUSCULAR | Status: DC | PRN
  Administered 2024-09-22: 150 ug via INTRATHECAL

## 2024-09-22 MED ORDER — CEFAZOLIN SODIUM-DEXTROSE 2-4 GM/100ML-% IV SOLN
2.0000 g | INTRAVENOUS | Status: AC
  Administered 2024-09-22: 2 g via INTRAVENOUS

## 2024-09-22 MED ORDER — LACTATED RINGERS IV SOLN: INTRAVENOUS | Status: AC

## 2024-09-22 MED ORDER — DEXAMETHASONE SODIUM PHOSPHATE 10 MG/ML IJ SOLN
INTRAMUSCULAR | Status: DC | PRN
Start: 2024-09-22 — End: 2024-09-22
  Administered 2024-09-22 (×2): 5 mg via INTRAVENOUS

## 2024-09-22 MED ORDER — NALOXONE HCL 0.4 MG/ML IJ SOLN: 0.4000 mg | INTRAMUSCULAR | Status: DC | PRN

## 2024-09-22 MED ORDER — KETOROLAC TROMETHAMINE 30 MG/ML IJ SOLN
30.0000 mg | Freq: Once | INTRAMUSCULAR | Status: AC
  Administered 2024-09-22: 30 mg via INTRAVENOUS

## 2024-09-22 MED ORDER — KETOROLAC TROMETHAMINE 30 MG/ML IJ SOLN
30.0000 mg | Freq: Four times a day (QID) | INTRAMUSCULAR | Status: AC | PRN
  Administered 2024-09-22 – 2024-09-23 (×3): 30 mg via INTRAVENOUS
  Filled 2024-09-22 (×3): qty 1

## 2024-09-22 MED ORDER — KETOROLAC TROMETHAMINE 30 MG/ML IJ SOLN
INTRAMUSCULAR | Status: AC
  Filled 2024-09-22: qty 1

## 2024-09-22 MED ORDER — ONDANSETRON HCL 4 MG/2ML IJ SOLN
INTRAMUSCULAR | Status: AC
  Filled 2024-09-22: qty 2

## 2024-09-22 MED ORDER — MORPHINE SULFATE (PF) 0.5 MG/ML IJ SOLN
INTRAMUSCULAR | Status: AC
  Filled 2024-09-22: qty 10

## 2024-09-22 MED ORDER — GABAPENTIN 100 MG PO CAPS: 100.0000 mg | ORAL_CAPSULE | Freq: Two times a day (BID) | ORAL | Status: DC | PRN

## 2024-09-22 MED ORDER — WITCH HAZEL-GLYCERIN EX PADS: 1.0000 | MEDICATED_PAD | CUTANEOUS | Status: DC | PRN

## 2024-09-22 MED ORDER — COCONUT OIL OIL
1.0000 | TOPICAL_OIL | Status: DC | PRN
  Administered 2024-09-22: 1 via TOPICAL

## 2024-09-22 MED ORDER — KETOROLAC TROMETHAMINE 30 MG/ML IJ SOLN: 30.0000 mg | Freq: Four times a day (QID) | INTRAMUSCULAR | Status: AC | PRN

## 2024-09-22 MED ORDER — IBUPROFEN 600 MG PO TABS
600.0000 mg | ORAL_TABLET | Freq: Four times a day (QID) | ORAL | Status: DC | PRN
  Administered 2024-09-23 – 2024-09-24 (×4): 600 mg via ORAL
  Filled 2024-09-22 (×4): qty 1

## 2024-09-22 MED ORDER — OXYTOCIN-SODIUM CHLORIDE 30-0.9 UT/500ML-% IV SOLN: 2.5000 [IU]/h | INTRAVENOUS | Status: AC

## 2024-09-22 MED ORDER — BUPIVACAINE IN DEXTROSE 0.75-8.25 % IT SOLN
INTRATHECAL | Status: DC | PRN
  Administered 2024-09-22: 1.6 mL via INTRATHECAL

## 2024-09-22 MED ORDER — DIPHENHYDRAMINE HCL 25 MG PO CAPS
25.0000 mg | ORAL_CAPSULE | Freq: Four times a day (QID) | ORAL | Status: DC | PRN
  Administered 2024-09-22 (×2): 25 mg via ORAL
  Filled 2024-09-22 (×3): qty 1

## 2024-09-22 MED ORDER — ACETAMINOPHEN 10 MG/ML IV SOLN
INTRAVENOUS | Status: DC | PRN
  Administered 2024-09-22: 1000 mg via INTRAVENOUS

## 2024-09-22 MED ORDER — DIBUCAINE (PERIANAL) 1 % EX OINT: 1.0000 | TOPICAL_OINTMENT | CUTANEOUS | Status: DC | PRN

## 2024-09-22 MED ORDER — CYCLOBENZAPRINE HCL 10 MG PO TABS: 10.0000 mg | ORAL_TABLET | Freq: Three times a day (TID) | ORAL | Status: DC | PRN

## 2024-09-22 MED ORDER — DIPHENHYDRAMINE HCL 50 MG/ML IJ SOLN: 12.5000 mg | INTRAMUSCULAR | Status: DC | PRN

## 2024-09-22 MED ORDER — MEDROXYPROGESTERONE ACETATE 150 MG/ML IM SUSP
150.0000 mg | INTRAMUSCULAR | Status: AC | PRN
  Administered 2024-09-24: 150 mg via INTRAMUSCULAR
  Filled 2024-09-22: qty 1

## 2024-09-22 MED ORDER — ONDANSETRON HCL 4 MG/2ML IJ SOLN
INTRAMUSCULAR | Status: DC | PRN
  Administered 2024-09-22: 4 mg via INTRAVENOUS

## 2024-09-22 MED ORDER — SENNOSIDES-DOCUSATE SODIUM 8.6-50 MG PO TABS
2.0000 | ORAL_TABLET | Freq: Every day | ORAL | Status: DC
  Administered 2024-09-23 – 2024-09-24 (×2): 2 via ORAL
  Filled 2024-09-22 (×2): qty 2

## 2024-09-22 MED ORDER — DIPHENHYDRAMINE HCL 25 MG PO CAPS: 25.0000 mg | ORAL_CAPSULE | ORAL | Status: DC | PRN

## 2024-09-22 MED ORDER — SODIUM CHLORIDE 0.9% FLUSH: 3.0000 mL | INTRAVENOUS | Status: DC | PRN

## 2024-09-22 MED ORDER — NIFEDIPINE ER OSMOTIC RELEASE 30 MG PO TB24
30.0000 mg | ORAL_TABLET | Freq: Every day | ORAL | Status: DC
Start: 2024-09-23 — End: 2024-09-23
  Filled 2024-09-22: qty 1

## 2024-09-22 MED ORDER — ACETAMINOPHEN 500 MG PO TABS
1000.0000 mg | ORAL_TABLET | Freq: Four times a day (QID) | ORAL | Status: AC
  Administered 2024-09-22 – 2024-09-23 (×4): 1000 mg via ORAL
  Filled 2024-09-22 (×5): qty 2

## 2024-09-22 MED ORDER — VALACYCLOVIR HCL 500 MG PO TABS
500.0000 mg | ORAL_TABLET | Freq: Every day | ORAL | Status: DC
  Administered 2024-09-22 – 2024-09-24 (×3): 500 mg via ORAL
  Filled 2024-09-22 (×3): qty 1

## 2024-09-22 MED ORDER — PHENYLEPHRINE HCL-NACL 20-0.9 MG/250ML-% IV SOLN
INTRAVENOUS | Status: DC | PRN
  Administered 2024-09-22: 60 ug/min via INTRAVENOUS

## 2024-09-22 MED ORDER — FENTANYL CITRATE (PF) 100 MCG/2ML IJ SOLN: 25.0000 ug | INTRAMUSCULAR | Status: DC | PRN

## 2024-09-22 MED ORDER — PRENATAL MULTIVITAMIN CH
1.0000 | ORAL_TABLET | Freq: Every day | ORAL | Status: DC
  Administered 2024-09-22 – 2024-09-23 (×2): 1 via ORAL
  Filled 2024-09-22 (×2): qty 1

## 2024-09-22 MED ORDER — DEXMEDETOMIDINE HCL IN NACL 80 MCG/20ML IV SOLN
INTRAVENOUS | Status: DC | PRN
  Administered 2024-09-22: 8 ug via INTRAVENOUS

## 2024-09-22 MED ORDER — MENTHOL 3 MG MT LOZG: 1.0000 | LOZENGE | OROMUCOSAL | Status: DC | PRN

## 2024-09-22 MED ORDER — SOD CITRATE-CITRIC ACID 500-334 MG/5ML PO SOLN
ORAL | Status: AC
  Filled 2024-09-22: qty 30

## 2024-09-22 MED ORDER — ACETAMINOPHEN 500 MG PO TABS
1000.0000 mg | ORAL_TABLET | Freq: Four times a day (QID) | ORAL | Status: DC | PRN
  Administered 2024-09-23 – 2024-09-24 (×2): 1000 mg via ORAL
  Filled 2024-09-22 (×2): qty 2

## 2024-09-22 MED ORDER — ZOLPIDEM TARTRATE 5 MG PO TABS: 5.0000 mg | ORAL_TABLET | Freq: Every evening | ORAL | Status: DC | PRN

## 2024-09-22 MED ORDER — ONDANSETRON HCL 4 MG/2ML IJ SOLN: 4.0000 mg | Freq: Three times a day (TID) | INTRAMUSCULAR | Status: DC | PRN

## 2024-09-22 MED ORDER — OXYCODONE HCL 5 MG PO TABS
5.0000 mg | ORAL_TABLET | ORAL | Status: DC | PRN
  Administered 2024-09-22: 5 mg via ORAL
  Administered 2024-09-23 – 2024-09-24 (×7): 10 mg via ORAL
  Filled 2024-09-22 (×6): qty 2
  Filled 2024-09-22: qty 1
  Filled 2024-09-22 (×2): qty 2

## 2024-09-22 MED ORDER — MORPHINE SULFATE (PF) 0.5 MG/ML IJ SOLN: INTRAMUSCULAR | Status: DC | PRN

## 2024-09-22 MED ORDER — OXYTOCIN-SODIUM CHLORIDE 30-0.9 UT/500ML-% IV SOLN
INTRAVENOUS | Status: DC | PRN
  Administered 2024-09-22: 300 mL via INTRAVENOUS

## 2024-09-22 MED ORDER — SODIUM CHLORIDE 0.9 % IR SOLN
Status: DC | PRN
  Administered 2024-09-22: 1000 mL

## 2024-09-22 MED ORDER — FENTANYL CITRATE (PF) 100 MCG/2ML IJ SOLN: INTRAMUSCULAR | Status: DC | PRN

## 2024-09-22 MED ORDER — CEFAZOLIN SODIUM-DEXTROSE 2-4 GM/100ML-% IV SOLN
INTRAVENOUS | Status: AC
  Filled 2024-09-22: qty 100

## 2024-09-22 MED ORDER — FENTANYL CITRATE (PF) 100 MCG/2ML IJ SOLN
INTRAMUSCULAR | Status: AC
  Filled 2024-09-22: qty 2

## 2024-09-22 MED ORDER — NALOXONE HCL 4 MG/10ML IJ SOLN: 1.0000 ug/kg/h | INTRAVENOUS | Status: DC | PRN

## 2024-09-22 MED ORDER — ENOXAPARIN SODIUM 40 MG/0.4ML IJ SOSY
40.0000 mg | PREFILLED_SYRINGE | INTRAMUSCULAR | Status: DC
  Administered 2024-09-23 – 2024-09-24 (×2): 40 mg via SUBCUTANEOUS
  Filled 2024-09-22 (×2): qty 0.4

## 2024-09-22 SURGICAL SUPPLY — 31 items
BENZOIN TINCTURE PRP APPL 2/3 (GAUZE/BANDAGES/DRESSINGS) ×2 IMPLANT
CHLORAPREP W/TINT 26 (MISCELLANEOUS) ×4 IMPLANT
CLAMP UMBILICAL CORD (MISCELLANEOUS) ×2 IMPLANT
CLOTH BEACON ORANGE TIMEOUT ST (SAFETY) ×2 IMPLANT
DERMABOND ADVANCED .7 DNX12 (GAUZE/BANDAGES/DRESSINGS) IMPLANT
DRAIN JACKSON PRT FLT 10 (DRAIN) IMPLANT
DRSG OPSITE POSTOP 4X10 (GAUZE/BANDAGES/DRESSINGS) ×2 IMPLANT
ELECTRODE REM PT RTRN 9FT ADLT (ELECTROSURGICAL) ×2 IMPLANT
EVACUATOR SILICONE 100CC (DRAIN) IMPLANT
EXTRACTOR VACUUM M CUP 4 TUBE (SUCTIONS) IMPLANT
GLOVE BIO SURGEON STRL SZ 6.5 (GLOVE) ×2 IMPLANT
GLOVE BIOGEL PI IND STRL 7.0 (GLOVE) ×4 IMPLANT
GOWN STRL REUS W/TWL LRG LVL3 (GOWN DISPOSABLE) ×4 IMPLANT
KIT ABG SYR 3ML LUER SLIP (SYRINGE) IMPLANT
NDL HYPO 25X5/8 SAFETYGLIDE (NEEDLE) IMPLANT
NEEDLE HYPO 25X5/8 SAFETYGLIDE (NEEDLE) IMPLANT
NS IRRIG 1000ML POUR BTL (IV SOLUTION) ×2 IMPLANT
PACK C SECTION WH (CUSTOM PROCEDURE TRAY) ×2 IMPLANT
PAD OB MATERNITY 4.3X12.25 (PERSONAL CARE ITEMS) ×2 IMPLANT
RTRCTR C-SECT PINK 25CM LRG (MISCELLANEOUS) IMPLANT
STRIP CLOSURE SKIN 1/2X4 (GAUZE/BANDAGES/DRESSINGS) ×2 IMPLANT
SUT CHROMIC 0 CT 1 (SUTURE) ×2 IMPLANT
SUT MNCRL AB 3-0 PS2 27 (SUTURE) ×2 IMPLANT
SUT PLAIN 2 0 XLH (SUTURE) ×2 IMPLANT
SUT PLAIN ABS 2-0 CT1 27XMFL (SUTURE) ×4 IMPLANT
SUT SILK 2 0 SH (SUTURE) IMPLANT
SUT VIC AB 0 CTX36XBRD ANBCTRL (SUTURE) ×8 IMPLANT
SUT VIC AB 2-0 SH 27XBRD (SUTURE) IMPLANT
TOWEL OR 17X24 6PK STRL BLUE (TOWEL DISPOSABLE) ×2 IMPLANT
TRAY FOLEY W/BAG SLVR 14FR LF (SET/KITS/TRAYS/PACK) ×2 IMPLANT
WATER STERILE IRR 1000ML POUR (IV SOLUTION) ×2 IMPLANT

## 2024-09-22 NOTE — Anesthesia Procedure Notes (Signed)
 Spinal  Patient location during procedure: OR Start time: 09/22/2024 10:50 AM End time: 09/22/2024 10:53 AM Reason for block: surgical anesthesia Staffing Performed: anesthesiologist  Anesthesiologist: Paul Lamarr BRAVO, MD Performed by: Paul Lamarr BRAVO, MD Authorized by: Paul Lamarr BRAVO, MD   Preanesthetic Checklist Completed: patient identified, IV checked, risks and benefits discussed, monitors and equipment checked, pre-op evaluation and timeout performed Spinal Block Patient position: sitting Prep: DuraPrep and site prepped and draped Patient monitoring: heart rate, continuous pulse ox and blood pressure Approach: midline Location: L3-4 Injection technique: single-shot Needle Needle type: Pencan  Needle gauge: 24 G Needle length: 10 cm Assessment Sensory level: T4 Events: CSF return Additional Notes Risks, benefits, and alternative discussed. Patient gave consent to procedure. Prepped and draped in sitting position. Clear CSF obtained after one needle pass. Positive terminal aspiration. No pain or paraesthesias with injection. Patient tolerated procedure well. Vital signs stable. LANEY Paul, MD

## 2024-09-22 NOTE — H&P (Signed)
 Michele Fields is a 28 y.o. female presenting for repeat CS .  Pt without cpmplaint. OB History     Gravida  5   Para  2   Term  2   Preterm  0   AB  2   Living  2      SAB  2   IAB  0   Ectopic  0   Multiple  0   Live Births  2          Past Medical History:  Diagnosis Date   Bacterial vaginosis    BRCA negative 10/2019   MyRisk neg   Family history of breast cancer    Increased risk of breast cancer 10/2019   IBIS=25.3%/riskscore=20.2%   Yeast infection    Past Surgical History:  Procedure Laterality Date   CESAREAN SECTION N/A 10/01/2020   Procedure: CESAREAN SECTION;  Surgeon: Storm Setter, DO;  Location: [facility];  Service: Obstetrics;  Laterality: N/A;   CESAREAN SECTION N/A 11/26/2021   Procedure: CESAREAN SECTION;  Surgeon: Henry Slough, MD;  Location: [facility];  Service: Obstetrics;  Laterality: N/A;   CHOLECYSTECTOMY     NO PAST SURGERIES     Family History: family history includes Breast cancer (age of onset: 34) in her maternal aunt; Breast cancer (age of onset: 68) in an other family member; Cervical cancer (age of onset: 70) in her paternal aunt; Ovarian cancer (age of onset: 65) in her paternal grandmother; Thyroid  disease in her paternal grandmother. Social History:  reports that she quit smoking about 4 years ago. Her smoking use included cigarettes. She has never used smokeless tobacco. She reports that she does not currently use alcohol. She reports that she does not use drugs.     Maternal Diabetes: No Genetic Screening: Normal Maternal Ultrasounds/Referrals: Normal Fetal Ultrasounds or other Referrals:  None Maternal Substance Abuse:  No Significant Maternal Medications:  None Significant Maternal Lab Results:  Group B Strep negative Number of Prenatal Visits:greater than 3 verified prenatal visits Maternal Vaccinations Other Comments:  pt declined vaccinations   Review of Systems History   Blood pressure 117/83, pulse (!)  102, temperature 98.3 F (36.8 C), temperature source Oral, resp. rate 20, height 5' 3 (1.6 m), weight 92.8 kg, last menstrual period 12/22/2023, SpO2 99%, currently breastfeeding. Exam Physical Exam  Physical Examination: General appearance - alert, well appearing, and in no distress Chest - clear to auscultation, no wheezes, rales or rhonchi, symmetric air entry Heart - normal rate and regular rhythm Abdomen - soft, nontender, nondistended, no masses or organomegaly Gravid  Extremities - no calf tenderness B   Prenatal labs: ABO, Rh: --/--/A POS (09/29 0930) Antibody: NEG (09/29 0930) Rubella: Immune (03/12 0000) RPR: NON REACTIVE (09/29 0925)  HBsAg: Negative (03/12 0000)  HIV: Non-reactive (03/12 0000)  GBS: Negative/-- (08/25 0000)  CBC    Component Value Date/Time   WBC 12.5 (H) 09/21/2024 0925   RBC 3.77 (L) 09/21/2024 0925   HGB 11.0 (L) 09/21/2024 0925   HGB 13.2 02/29/2020 1054   HCT 34.4 (L) 09/21/2024 0925   HCT 39.6 02/29/2020 1054   PLT 179 09/21/2024 0925   PLT 253 02/29/2020 1054   MCV 91.2 09/21/2024 0925   MCV 94 02/29/2020 1054   MCV 90 11/27/2014 2301   MCH 29.2 09/21/2024 0925   MCHC 32.0 09/21/2024 0925   RDW 14.6 09/21/2024 0925   RDW 12.4 02/29/2020 1054   RDW 12.9 11/27/2014 2301  LYMPHSABS 2.7 02/06/2020 1625   LYMPHSABS 2.4 11/27/2014 2301   MONOABS 0.6 02/06/2020 1625   MONOABS 0.9 11/27/2014 2301   EOSABS 0.1 02/06/2020 1625   EOSABS 0.1 11/27/2014 2301   BASOSABS 0.0 02/06/2020 1625   BASOSABS 0.1 11/27/2014 2301    Assessment/Plan: IUP at 39 weeks  Pt for repeat CS  Plans Depo for [facility] Risks of bleeding, infection and damage to internal organs . Pt understands and agrees with the risks   Nyjai Graff A Trinidy Masterson 09/22/2024, 10:23 AM

## 2024-09-22 NOTE — Lactation Note (Signed)
 This note was copied from a baby's chart. Lactation Consultation Note  Patient Name: Michele Fields Date: 09/22/2024 Age:27 hours Reason for consult: Initial assessment;Early term 37-38.6wks, C/S delivery.  P3, Per [name] breastfeeding is going well. [name] latched infant on her left breast with pillow support using the cross cradle hold, LC worked with infant having deeper latch, infant was still breastfeeding after 10 minutes when LC left the room. [name] will continue to breastfeed infant by cues, on demand, 8-12 times within 24 hours, skin to skin. LC discussed importance of maternal rest, meals and hydration. [name] knows to call for latch assistance if needed. [name] was made aware of O/P services, breastfeeding support groups, community resources, and our phone # for post-discharge questions.    Maternal Data Has patient been taught Hand Expression?: Yes Does the patient have breastfeeding experience prior to this delivery?: Yes How long did the patient breastfeed?: Per [name], she breastfeed 1st child for 2 months and 2nd child for 4 months.  Feeding Mother's Current Feeding Choice: Breast Milk  LATCH Score Latch: Grasps breast easily, tongue down, lips flanged, rhythmical sucking.  Audible Swallowing: Spontaneous and intermittent  Type of Nipple: Everted at rest and after stimulation  Comfort (Breast/Nipple): Soft / non-tender  Hold (Positioning): Assistance needed to correctly position infant at breast and maintain latch.  LATCH Score: 9   Lactation Tools Discussed/Used    Interventions Interventions: Breast feeding basics reviewed;Assisted with latch;Skin to skin;Breast compression;Adjust position;Support pillows;Position options;Education;DEBP;LC Services brochure;CDC milk storage guidelines;CDC Guidelines for Breast Pump Cleaning;Guidelines for Milk Supply and Pumping Schedule Handout  Discharge    Consult Status Consult Status: Follow-up Date: 09/23/24 Follow-up type:  In-patient    Grayce LULLA Batter 09/22/2024, 10:19 PM

## 2024-09-22 NOTE — Anesthesia Postprocedure Evaluation (Signed)
 Anesthesia Post Note  Patient: Michele Fields  Procedure(s) Performed: CESAREAN DELIVERY (Abdomen)     Patient location during evaluation: PACU Anesthesia Type: Spinal Level of consciousness: awake and alert Pain management: pain level controlled Vital Signs Assessment: post-procedure vital signs reviewed and stable Respiratory status: spontaneous breathing, nonlabored ventilation and respiratory function stable Cardiovascular status: blood pressure returned to baseline Postop Assessment: no apparent nausea or vomiting, spinal receding, no headache and no backache Anesthetic complications: no   No notable events documented.  Last Vitals:  Vitals:   09/22/24 1315 09/22/24 1327  BP: 100/69 112/67  Pulse: 81 81  Resp: 19 16  Temp: 36.6 C 36.6 C  SpO2: 100% 100%    Last Pain:  Vitals:   09/22/24 1330  TempSrc:   PainSc: 3                  Vertell Row

## 2024-09-22 NOTE — Transfer of Care (Signed)
 Immediate Anesthesia Transfer of Care Note  Patient: Michele Fields  Procedure(s) Performed: CESAREAN DELIVERY (Abdomen)  Patient Location: PACU  Anesthesia Type:Spinal  Level of Consciousness: awake  Airway & Oxygen Therapy: Patient Spontanous Breathing  Post-op Assessment: Report given to RN  Post vital signs: Reviewed and stable  Last Vitals:  Vitals Value Taken Time  BP 114/76 09/22/24 12:21  Temp    Pulse 80 09/22/24 12:22  Resp 17 09/22/24 12:22  SpO2 100 % 09/22/24 12:22  Vitals shown include unfiled device data.  Last Pain:  Vitals:   09/22/24 0851  TempSrc: Oral  PainSc: 0-No pain         Complications: No notable events documented.

## 2024-09-22 NOTE — Op Note (Signed)
 Cesarean Section Procedure Note   Michele Fields  09/22/2024  Indications: Scheduled Proceedure/Maternal Request   Pre-operative Diagnosis: H/O repeat low transverse cesarean section TIMES TWO  Post-operative Diagnosis: Same   Surgeon: Surgeons and Role:    DEWAINE Armond Cape, MD - Primary   Assistants: DR MAGALI . An assistant was needed for the complexity of the case    Anesthesia: spinal   Procedure Details:  The patient was seen in the Holding Room. The risks, benefits, complications, treatment options, and expected outcomes were discussed with the patient. The patient concurred with the proposed plan, giving informed consent. identified as Michele Fields and the procedure verified as C-Section Delivery. A Time Out was held and the above information confirmed.  After induction of anesthesia, the patient was draped and prepped in the usual sterile manner. A transverse incision was made and carried down through the subcutaneous tissue to the fascia. Fascial incision was made in the midline and extended transversely. The fascia was separated from the underlying rectus muscle superiorly and inferiorly. The peritoneum was identified and entered. Peritoneal incision was extended longitudinally with good visualization of bowel and bladder. The utero-vesical peritoneal reflection was incised transversely and the bladder flap was bluntly freed from the lower uterine segment.  An alexsis retractor was placed in the abdomen.   A low transverse uterine incision was made. Delivered from cephalic presentation was a  infant, with Apgar scores of 7 at one minute and 9 at five minutes. Cord ph was not sent the umbilical cord was clamped and cut cord blood was obtained for evaluation. The placenta was removed Intact and appeared normal. The uterine outline, tubes and ovaries appeared normal}. The uterine incision was closed with running locked sutures of 0Vicryl. The serosa on the left side of the incision had come  apart and was reapproximated using 0 vicryl.      Hemostasis was observed. Lavage was carried out until clear. The alexsis was removed.  The peritoneum was closed with 0 chromic.  The muscles were examined and any bleeders were made hemostatic using bovie cautery device.   The fascia was then reapproximated with running sutures of 0 vicryl.  The subcutaneous tissue was reapproximated  With interrupted stitches using 2-0 plain gut. The subcuticular closure was performed using 3-60monocryl     Instrument, sponge, and needle counts were correct prior the abdominal closure and were correct at the conclusion of the case.    Findings: infant was delivered from vtx presentation. The fluid was clear.  The uterus tubes and ovaries appeared normal.     Estimated Blood Loss: 520cc   Total IV Fluids:   Urine Output: 825CC OF clear urine  Specimens: none  Complications: no complications  Disposition: PACU - hemodynamically stable.   Maternal Condition: stable   Baby condition / location:  Nursery  Attending Attestation: I was present and scrubbed for the entire procedure.   Signed: Surgeon(s): Trellis Guirguis, MD

## 2024-09-23 LAB — CBC
HCT: 25.9 % — ABNORMAL LOW (ref 36.0–46.0)
Hemoglobin: 8.7 g/dL — ABNORMAL LOW (ref 12.0–15.0)
MCH: 29.3 pg (ref 26.0–34.0)
MCHC: 33.6 g/dL (ref 30.0–36.0)
MCV: 87.2 fL (ref 80.0–100.0)
Platelets: 184 K/uL (ref 150–400)
RBC: 2.97 MIL/uL — ABNORMAL LOW (ref 3.87–5.11)
RDW: 14.4 % (ref 11.5–15.5)
WBC: 15.7 K/uL — ABNORMAL HIGH (ref 4.0–10.5)
nRBC: 0 % (ref 0.0–0.2)

## 2024-09-23 LAB — COMPREHENSIVE METABOLIC PANEL WITH GFR
ALT: 10 U/L (ref 0–44)
AST: 15 U/L (ref 15–41)
Albumin: 2.4 g/dL — ABNORMAL LOW (ref 3.5–5.0)
Alkaline Phosphatase: 100 U/L (ref 38–126)
Anion gap: 9 (ref 5–15)
BUN: 9 mg/dL (ref 6–20)
CO2: 20 mmol/L — ABNORMAL LOW (ref 22–32)
Calcium: 8.9 mg/dL (ref 8.9–10.3)
Chloride: 106 mmol/L (ref 98–111)
Creatinine, Ser: 0.62 mg/dL (ref 0.44–1.00)
GFR, Estimated: >60 mL/min (ref >60)
Glucose, Bld: 98 mg/dL (ref 70–99)
Potassium: 3.7 mmol/L (ref 3.5–5.1)
Sodium: 135 mmol/L (ref 135–145)
Total Bilirubin: 0.5 mg/dL (ref 0.0–1.2)
Total Protein: 5 g/dL — ABNORMAL LOW (ref 6.5–8.1)

## 2024-09-23 MED ORDER — FERROUS SULFATE 325 (65 FE) MG PO TABS
325.0000 mg | ORAL_TABLET | ORAL | Status: DC
  Administered 2024-09-23: 325 mg via ORAL
  Filled 2024-09-23: qty 1

## 2024-09-23 MED ORDER — DOCUSATE SODIUM 100 MG PO CAPS
100.0000 mg | ORAL_CAPSULE | Freq: Every day | ORAL | Status: DC
  Administered 2024-09-23 – 2024-09-24 (×2): 100 mg via ORAL
  Filled 2024-09-23 (×2): qty 1

## 2024-09-23 NOTE — Progress Notes (Signed)
 Subjective: Postpartum Day 1: Cesarean Delivery Patient reports tolerating PO, + flatus, and no problems voiding.  Desires depo provera  prior to discharge home.  Objective: Vital signs in last 24 hours: Temp:  [97.6 F (36.4 C)-98.3 F (36.8 C)] 98.2 F (36.8 C) (10/01 0433) Pulse Rate:  [78-89] 78 (10/01 0433) Resp:  [16-23] 16 (10/01 0433) BP: (100-128)/(58-80) 108/58 (10/01 0433) SpO2:  [98 %-100 %] 98 % (10/01 0433)  Physical Exam:  General: alert, cooperative, and no distress Lochia: appropriate Uterine Fundus: firm Incision: dressing c/d/i DVT Evaluation: No evidence of DVT seen on physical exam. No cords or calf tenderness.  Recent Labs    09/22/24 1526 09/23/24 0453  HGB 10.6* 8.7*  HCT 31.9* 25.9*    Assessment/Plan: Status post Cesarean section. Doing well postoperatively.  Continue current post op and postpartum care Depo provera  ordered prior to d/c home Circumcision done today  Jon CINDERELLA Rummer, MD 09/23/2024, 12:26 PM

## 2024-09-23 NOTE — Progress Notes (Signed)
 Per chart review, SDOH determined difficulties with housing. CSW met [name] at bedside to complete an assessment and offer support. CSW entered the room, introduced herself and explained the reason for the visit. [name] presented as calm, was agreeable to consult and remained engaged throughout encounter.  CSW asked [name] about the SDOH determining difficulties with housing. [name] denied housing issues they currently residing at [address] Michele [place], [place] [address]. [name] reported this location to be a stable environment for her and the infant.   [name] was referred for history of depression/anxiety/ADHD.  * Referral screened out by Clinical Social Worker because none of the following criteria appear to apply:  ~ History of anxiety/depression/ADHD during this pregnancy, or of post-partum depression following prior delivery.  ~ Diagnosis of anxiety and/or depression within last 3 years  Per OB notes [name] did not indicate any signs/symptoms during her pregnancy.  Edinburgh score 5  Anxiety/Depression 2015  OR  * [name]'s symptoms currently being treated with medication and/or therapy.  Please contact the Clinical Social Worker if needs arise, by [facility] request, or if [name] scores greater than 9/yes to question 10 on Edinburgh Postpartum Depression Screen.  Rosina Molt, [place] Clinical Social Worker [phone]

## 2024-09-24 DIAGNOSIS — D62 Acute posthemorrhagic anemia: Secondary | ICD-10-CM | POA: Diagnosis not present

## 2024-09-24 LAB — BIRTH TISSUE RECOVERY COLLECTION (PLACENTA DONATION)

## 2024-09-24 MED ORDER — FERROUS SULFATE 325 (65 FE) MG PO TABS: 325.0000 mg | ORAL_TABLET | ORAL | 2 refills | Status: AC

## 2024-09-24 MED ORDER — ACETAMINOPHEN 500 MG PO TABS: 1000.0000 mg | ORAL_TABLET | Freq: Four times a day (QID) | ORAL | 0 refills | Status: AC | PRN

## 2024-09-24 MED ORDER — OXYCODONE HCL 5 MG PO TABS: 5.0000 mg | ORAL_TABLET | ORAL | 0 refills | Status: AC | PRN

## 2024-09-24 MED ORDER — IBUPROFEN 600 MG PO TABS: 600.0000 mg | ORAL_TABLET | Freq: Four times a day (QID) | ORAL | 0 refills | Status: AC | PRN

## 2024-09-24 NOTE — Discharge Summary (Signed)
 RCS OB Discharge Summary       Patient Name: Michele Fields DOB: 1996/07/24 MRN: [id number]  Date of admission: 09/22/2024 Delivering MD: ARMOND CAPE Date of delivery: 09/22/2024 Type of delivery: RCS  Newborn Data: Sex: Baby Female Circumcision: in pot circ done  Live born female  Birth Weight: 9 lb 7.3 oz (4290 g) APGAR: 7, 9  Newborn Delivery   Birth date/time: 09/22/2024 11:23:57 Delivery type: C-Section, Low Transverse Trial of labor: No C-section categorization: Repeat     Feeding: breast and bottle Infant being discharge to home with mother in stable condition.   Admitting diagnosis: Status post repeat low transverse cesarean section [Z98.891] History of cesarean section [Z98.891] Pregnancy [Z34.90] S/P cesarean section [S01.108] Intrauterine pregnancy: [redacted]w[redacted]d     Secondary diagnosis:  Principal Problem:   History of cesarean section Active Problems:   S/P cesarean section   ABLA (acute blood loss anemia)   Normal postpartum course                                Complications: None                                                              Intrapartum Procedures: cesarean: low cervical, transverse Postpartum Procedures: none Complications-Operative and Postpartum: none Augmentation: AROM and at delivery   History of Present Illness: [name] is a 28 y.o. female, [id number], who presents at [redacted]w[redacted]d weeks gestation. The patient has been followed at  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   Status post repeat low transverse cesarean section 11/26/2021   Gestational diabetes  10/20/2021   Chronic migraine 09/13/2020     Active Ambulatory Problems    Diagnosis Date Noted   Chronic migraine 09/13/2020   Gestational diabetes 10/20/2021   Encounter for maternal care for low transverse scar from repeat cesarean delivery 11/26/2021   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   Status post repeat low transverse cesarean section 11/26/2021   Resolved Ambulatory Problems    Diagnosis Date Noted   Menorrhagia 06/19/2013   Screening examination for venereal disease 06/19/2013   Abdominal pain, other specified site 06/19/2013   BMI (body mass index), pediatric, > 99% for age 89/23/2014   ADD (attention deficit disorder) 07/15/2013   Presence of subdermal contraceptive device 07/15/2013   Headache 07/15/2013   Allergic rhinitis 07/15/2013   Thyroid  nodule 10/19/2014   Encounter for supervision of low-risk pregnancy, antepartum 02/29/2020   Pregnancy with 37 weeks completed gestation 09/13/2020   Chronic midline low back pain without sciatica 09/13/2020   Gestational hypertension 09/30/2020   Postpartum care following cesarean delivery 10/9 10/01/2020   Cesarean delivery - AOD and FITL 10/01/2020   Maternal anemia, with delivery - IDA with superimposed ABL anemia 10/02/2020   Past Medical History:  Diagnosis Date   Bacterial vaginosis    BRCA negative  10/2019   Family history of breast cancer    Increased risk of breast cancer 10/2019   Yeast infection      Hospital course:  Sceduled C/S   28 y.o. yo [id number] at [redacted]w[redacted]d was admitted to the hospital 09/22/2024 for scheduled cesarean section with the following indication:Elective Repeat.Delivery details are as follows:  Membrane Rupture Time/Date: 11:22 AM,09/22/2024  Delivery Method:C-Section, Low Transverse Operative Delivery:N/A Details of operation can be found in separate operative note.  Patient had a postpartum course complicated bypt was addmiteted on 9/30 for RCS with Dr Armond, ebl was , hgb drop of 11-9.8  asymptomaitc on po iron .  She is ambulating, tolerating a regular diet, passing flatus, and urinating well. Patient is discharged home in stable condition on  09/24/24        Newborn Data: Birth date:09/22/2024 Birth time:11:23 AM Gender:Female Living status:Living Apgars:7 ,9  Weight:4290 g     Hospital Course--Scheduled Cesarean: POD#2 Patient was admitted on 9/30 for a scheduled Repeat cesarean delivery.   She was taken to the operating room, where Dr. Armond performed a Repeat LTCS under spinal anesthesia, with delivery of a viable baby female, with weight and Apgars as listed below. Infant was in good condition and remained at the patient's bedside.  The patient was taken to recovery in good condition.  Patient planned to br and bottle feed.  On post-op day 1, patient was doing well, tolerating a regular diet, with Hgb of 8.9 asymptomatic and on po iron .  Throughout her stay, her physical exam was WNL, her incision was CDI, and her vital signs remained stable.  By post-op day 1, she was up ad lib, tolerating a regular diet, with good pain control with po med.  She was deemed to have received the full benefit of her hospital stay, and was discharged home in stable condition.  Contraceptive choice was depo before leaving today .  Meets early discharge criteria and desires to go home.   Physical exam  Vitals:   09/22/24 2353 09/23/24 0433 09/23/24 1535 09/23/24 2155  BP: 104/65 (!) 108/58 115/71 134/77  Pulse: 82 78 80 80  Resp: 16 16 20 19   Temp: 98.3 F (36.8 C) 98.2 F (36.8 C)  98.2 F (36.8 C)  TempSrc: Oral Oral  Oral  SpO2: 99% 98%  99%  Weight:      Height:       General: alert, cooperative, and no distress Lochia: appropriate Uterine Fundus: firm Incision: Healing well with no significant drainage, No significant erythema, Dressing is clean, dry, and intact, honeycomb dressing CDI Perineum: intact DVT Evaluation: No evidence of DVT seen on physical exam. Negative Homan's  sign. No cords or calf tenderness. No significant calf/ankle edema.  Labs: Lab Results  Component Value Date   WBC 15.7 (H) 09/23/2024   HGB 8.7 (L) 09/23/2024   HCT 25.9 (L) 09/23/2024   MCV 87.2 09/23/2024   PLT 184 09/23/2024      Latest Ref Rng & Units 09/23/2024    4:53 AM  CMP  Glucose 70 - 99 mg/dL 98   BUN 6 - 20 mg/dL 9   Creatinine 9.55 - 8.99 mg/dL 9.37   Sodium 864 - 854 mmol/L 135   Potassium 3.5 - 5.1 mmol/L 3.7   Chloride 98 - 111 mmol/L 106   CO2 22 - 32 mmol/L 20   Calcium 8.9 - 10.3 mg/dL 8.9   Total Protein 6.5 - 8.1 g/dL 5.0   Total Bilirubin 0.0 -  1.2 mg/dL 0.5   Alkaline Phos 38 - 126 U/L 100   AST 15 - 41 U/L 15   ALT 0 - 44 U/L 10     Date of discharge: 09/24/2024 Discharge Diagnoses: Term Pregnancy-delivered Discharge instruction: per After Visit Summary and Baby and Me Booklet.  After visit meds:   Activity:           unrestricted and pelvic rest Advance as tolerated. Pelvic rest for 6 weeks.  Diet:                routine Medications: Ibuprofen , Colace, Iron , and ocy IR, tylenol  Postpartum contraception: Depo Provera  Condition:  Pt discharge to home with baby in stable  Meds: Allergies as of 09/24/2024       Reactions   Other Other (See Comments)   Seasonal Allergies        Medication List     STOP taking these medications    cyclobenzaprine  10 MG tablet Commonly known as: FLEXERIL    glyBURIDE 2.5 MG tablet Commonly known as: DIABETA   NIFEdipine  30 MG 24 hr tablet Commonly known as: PROCARDIA -XL/NIFEDICAL-XL   ondansetron  4 MG disintegrating tablet Commonly known as: ZOFRAN -ODT   oxyCODONE -acetaminophen  5-325 MG tablet Commonly known as: PERCOCET/ROXICET   valACYclovir 500 MG tablet Commonly known as: VALTREX       TAKE these medications    acetaminophen  500 MG tablet Commonly known as: TYLENOL  Take 2 tablets (1,000 mg total) by mouth every 6 (six) hours as needed for mild pain (pain score 1-3).   ferrous  sulfate 325 (65 FE) MG tablet Take 1 tablet (325 mg total) by mouth every other day. Start taking on: September 25, 2024   ibuprofen  600 MG tablet Commonly known as: ADVIL  Take 1 tablet (600 mg total) by mouth every 6 (six) hours as needed for headache or cramping.   oxyCODONE  5 MG immediate release tablet Commonly known as: Oxy IR/ROXICODONE  Take 1-2 tablets (5-10 mg total) by mouth every 4 (four) hours as needed for moderate pain (pain score 4-6) or severe pain (pain score 7-10).               Discharge Care Instructions  (From admission, onward)           Start     Ordered   09/24/24 0000  Discharge wound care:       Comments: Take dressing off on day 5-7 postpartum.  Report increased drainage, redness or warmth. Clean with water , let soap trickle down body. Can leave steri strips on until they fall off or take them off gently at day 10. Keep open to air, clean and dry.   09/24/24 0554            Discharge Follow Up:   Follow-up Information     [facility]. Schedule an appointment as soon as possible for a visit in 6 week(s).   Specialty: Obstetrics and Gynecology Contact information: [address] [place]  [address] [phone]                 Srinivas Lippman  CNM, FNP-C, PMHNP-BC  [id number]  [place], [place] [address]  Cell: [phone]  Office Phone: [phone] Fax: [phone] 09/24/2024  5:54 AM

## 2024-09-30 ENCOUNTER — Telehealth (HOSPITAL_COMMUNITY): Payer: Self-pay

## 2024-09-30 NOTE — Telephone Encounter (Signed)
 09/30/2024 1247  Name: Trany Glendoris Nodarse MRN: [id number] DOB: 1996/09/13  Reason for Call:  Transition of Care Hospital Discharge Call  Contact Status: Patient Contact Status: Complete  Language assistant needed:          Follow-Up Questions: Do You Have Any Concerns About Your Health As You Heal From Delivery?: Yes What Concerns Do You Have About Your Health?: Patient states that she has had a bad headache for 3 days. Patient reports that she has not reached out to her OB or checked her BP. RN explained preeclampsia and warning signs. RN told patient to call her OB to see if she can been in the office or come to MAU at [facility]. RN explained that [facility] MAU is open 24 hours and we are happy to see her. Patient has no other questions or concerns. Do You Have Any Concerns About Your Infants Health?: No  Edinburgh Postnatal Depression Scale:  In the Past 7 Days: I have been able to laugh and see the funny side of things.: As much as I always could I have looked forward with enjoyment to things.: As much as I ever did I have blamed myself unnecessarily when things went wrong.: No, never I have been anxious or worried for no good reason.: No, not at all I have felt scared or panicky for no good reason.: No, not at all Things have been getting on top of me.: No, I have been coping as well as ever I have been so unhappy that I have had difficulty sleeping.: Not at all I have felt sad or miserable.: No, not at all I have been so unhappy that I have been crying.: No, never The thought of harming myself has occurred to me.: Never [place] Postnatal Depression Scale Total: 0  PHQ2-9 Depression Scale:     Discharge Follow-up: Edinburgh score requires follow up?: No Patient was advised of the following resources:: Breastfeeding Support Group, Support Group  Post-discharge interventions: Reviewed Newborn Safe Sleep Practices  Signature  Rosaline Deretha PEAK
